# Patient Record
Sex: Female | Born: 1970 | Race: White | Hispanic: No | Marital: Married | Smoking: Never smoker
Health system: Southern US, Community
[De-identification: ages and names within clinical notes are randomized; demographics above are authoritative.]

## PROBLEM LIST (undated history)

## (undated) DIAGNOSIS — M1990 Unspecified osteoarthritis, unspecified site: Secondary | ICD-10-CM

## (undated) DIAGNOSIS — G459 Transient cerebral ischemic attack, unspecified: Secondary | ICD-10-CM

## (undated) DIAGNOSIS — E079 Disorder of thyroid, unspecified: Secondary | ICD-10-CM

## (undated) DIAGNOSIS — I10 Essential (primary) hypertension: Secondary | ICD-10-CM

## (undated) DIAGNOSIS — E282 Polycystic ovarian syndrome: Secondary | ICD-10-CM

## (undated) HISTORY — DX: Disorder of thyroid, unspecified: E07.9

## (undated) HISTORY — DX: Polycystic ovarian syndrome: E28.2

## (undated) HISTORY — DX: Unspecified osteoarthritis, unspecified site: M19.90

---

## 1997-01-22 HISTORY — PX: OTHER SURGICAL HISTORY: SHX169

## 2010-09-30 ENCOUNTER — Inpatient Hospital Stay (INDEPENDENT_AMBULATORY_CARE_PROVIDER_SITE_OTHER)
Admission: RE | Admit: 2010-09-30 | Discharge: 2010-09-30 | Disposition: A | Discharge status: Home / Self Care | Payer: PRIVATE HEALTH INSURANCE | Source: Ambulatory Visit | Attending: Emergency Medicine | Admitting: Emergency Medicine

## 2010-09-30 ENCOUNTER — Encounter: Payer: Self-pay | Admitting: Emergency Medicine

## 2010-09-30 DIAGNOSIS — E039 Hypothyroidism, unspecified: Secondary | ICD-10-CM | POA: Insufficient documentation

## 2010-09-30 DIAGNOSIS — J069 Acute upper respiratory infection, unspecified: Secondary | ICD-10-CM

## 2010-10-17 ENCOUNTER — Inpatient Hospital Stay (INDEPENDENT_AMBULATORY_CARE_PROVIDER_SITE_OTHER)
Admission: RE | Admit: 2010-10-17 | Discharge: 2010-10-17 | Disposition: A | Discharge status: Home / Self Care | Payer: PRIVATE HEALTH INSURANCE | Source: Ambulatory Visit | Attending: Family Medicine | Admitting: Family Medicine

## 2010-10-17 ENCOUNTER — Encounter: Payer: Self-pay | Admitting: Family Medicine

## 2010-10-17 DIAGNOSIS — D509 Iron deficiency anemia, unspecified: Secondary | ICD-10-CM | POA: Insufficient documentation

## 2010-10-17 DIAGNOSIS — R5383 Other fatigue: Secondary | ICD-10-CM

## 2010-10-17 DIAGNOSIS — I10 Essential (primary) hypertension: Secondary | ICD-10-CM

## 2010-10-17 DIAGNOSIS — R609 Edema, unspecified: Secondary | ICD-10-CM

## 2010-10-17 DIAGNOSIS — R5381 Other malaise: Secondary | ICD-10-CM | POA: Insufficient documentation

## 2010-10-17 DIAGNOSIS — G4700 Insomnia, unspecified: Secondary | ICD-10-CM | POA: Insufficient documentation

## 2010-10-17 DIAGNOSIS — E039 Hypothyroidism, unspecified: Secondary | ICD-10-CM

## 2010-10-17 LAB — CONVERTED CEMR LAB
Bilirubin Urine: NEGATIVE
Glucose, Urine, Semiquant: NEGATIVE
Specific Gravity, Urine: 1.02
WBC Urine, dipstick: NEGATIVE
pH: 6.5

## 2010-10-20 ENCOUNTER — Encounter: Payer: Self-pay | Admitting: Family Medicine

## 2010-10-29 ENCOUNTER — Encounter: Payer: Self-pay | Admitting: Family Medicine

## 2010-11-01 ENCOUNTER — Ambulatory Visit (INDEPENDENT_AMBULATORY_CARE_PROVIDER_SITE_OTHER): Payer: PRIVATE HEALTH INSURANCE | Admitting: Family Medicine

## 2010-11-01 ENCOUNTER — Encounter: Payer: Self-pay | Admitting: Family Medicine

## 2010-11-01 VITALS — BP 128/87 | HR 71 | Ht 61.0 in | Wt 142.0 lb

## 2010-11-01 DIAGNOSIS — R61 Generalized hyperhidrosis: Secondary | ICD-10-CM

## 2010-11-01 DIAGNOSIS — I10 Essential (primary) hypertension: Secondary | ICD-10-CM | POA: Insufficient documentation

## 2010-11-01 DIAGNOSIS — M130 Polyarthritis, unspecified: Secondary | ICD-10-CM | POA: Insufficient documentation

## 2010-11-01 DIAGNOSIS — C439 Malignant melanoma of skin, unspecified: Secondary | ICD-10-CM | POA: Insufficient documentation

## 2010-11-01 DIAGNOSIS — M1990 Unspecified osteoarthritis, unspecified site: Secondary | ICD-10-CM

## 2010-11-01 DIAGNOSIS — G8929 Other chronic pain: Secondary | ICD-10-CM | POA: Insufficient documentation

## 2010-11-01 DIAGNOSIS — E282 Polycystic ovarian syndrome: Secondary | ICD-10-CM | POA: Insufficient documentation

## 2010-11-01 DIAGNOSIS — M549 Dorsalgia, unspecified: Secondary | ICD-10-CM

## 2010-11-01 DIAGNOSIS — E039 Hypothyroidism, unspecified: Secondary | ICD-10-CM | POA: Insufficient documentation

## 2010-11-01 MED ORDER — ZOLPIDEM TARTRATE 10 MG PO TABS: 10.0000 mg | ORAL_TABLET | Freq: Every evening | ORAL | Status: DC | PRN

## 2010-11-01 NOTE — Patient Instructions (Signed)
We will you with your lab results.

## 2010-11-01 NOTE — Progress Notes (Signed)
Subjective:    Patient ID: Carly [name], female    DOB: November 23, 1970, 40 y.o.   MRN: [id number]  HPI Ran out of her thyroid med for 6 weeks and started gaining weight and face started swelling so went to UC.  Has been back on the 125 dose for about a week.  She is feeling better.  They increased her dose to her and we have micrograms about a week ago.  She also complains of hot flashes and night sweats. She says her previous physician felt that she was likely perimenopausal. I'm not sure to ask to check hormone levels. 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 she has not been on birth control because she's had infertility issues. She has been on birth control the past to help regulate her periods. She was on Yasmin one time and they started noticing elevations in her blood pressure so was discontinued. She now is treated with losartan for hypertension and has not restarted any birth control.  Review of Systems  Constitutional: Negative for fever, diaphoresis and unexpected weight change.  HENT: Negative for hearing loss, rhinorrhea and tinnitus.        Hay fever and allergies  Eyes: Negative for visual disturbance.  Respiratory: Negative for cough and wheezing.   Cardiovascular: Negative for chest pain and palpitations.  Gastrointestinal: Negative for nausea, vomiting, diarrhea and blood in stool.  Genitourinary: Negative for vaginal bleeding, vaginal discharge and difficulty urinating.  Musculoskeletal: Negative for myalgias and arthralgias.  Skin: Negative for rash.  Neurological: Negative for headaches.  Hematological: Negative for adenopathy. Does not bruise/bleed easily.  Psychiatric/Behavioral: Negative for sleep disturbance and dysphoric mood. The patient is not nervous/anxious.     BP 128/87  Pulse 71  Ht 5\' 1"  (1.549 m)  Wt 142 lb (64.411 kg)  BMI 26.83 kg/m2    No Known Allergies  Past Medical History  Diagnosis Date  . Thyroid disease   . PCOS (polycystic ovarian syndrome)     . Arthritis     Past Surgical History  Procedure Date  . Melanoma removal     RT SIDE  . Lymph nodes   . 2 ectopic pregnancies [id number]  . Removal of fallopian tube 1999    History   Social History  . Marital Status: Married    Spouse Name: Molly Maduro    Number of Children: 3  . Years of Education: N/A   Occupational History  . 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    Social History Main Topics  . Smoking status: Never Smoker   . Smokeless tobacco: Not on file  . Alcohol Use: No  . Drug Use: No  . Sexually Active: Yes -- Female partner(s)     adopted   Other Topics Concern  . Not on file   Social History Narrative   Caffeine daily. No regular exercise.  She was adopted.     Family History  Problem Relation Age of Onset  . Melanoma Mother   . Depression Mother   . Hyperlipidemia Mother   . Colon cancer Maternal Grandmother   . Colon cancer Father 33    deceased.     Ms. Paullin does not currently have medications on file.     Objective:   Physical Exam  Constitutional: She is oriented to person, place, and time. She appears well-developed and well-nourished.  HENT:  Head: Normocephalic and atraumatic.  Cardiovascular: Normal rate, regular rhythm and normal heart sounds.  Pulmonary/Chest: Effort normal and breath sounds normal.  Neurological: She is alert and oriented to person, place, and time.  Skin: Skin is warm and dry.  Psychiatric: She has a normal mood and affect. Her behavior is normal.          Assessment & Plan:  Hypothyroid-will give her Lasix and her blood work checked in the next couple weeks when she has been on there current dose at least 2-3 weeks. That way we can adjust it before she is due for her next refill. She was previously on 125 mcg so this will most likely be an accurate dose.  Night sweats/hot flashes-she certainly could be perimenopausal. I do think that is possible. Other medications are new. No drugs like meloxicam can  also cause hot flashes. She says she's recently decreased her dose. The Cymbalta can also cause nighttime sweats and hot flashes but she is also recently decreased her dose from 90-60 mg on that as well. We will also make sure that her thyroid is adjusted. She could also consider going back on birth control as an option.

## 2010-12-04 ENCOUNTER — Other Ambulatory Visit: Payer: Self-pay | Admitting: *Deleted

## 2010-12-04 MED ORDER — SPIRONOLACTONE 100 MG PO TABS: 100.0000 mg | ORAL_TABLET | Freq: Two times a day (BID) | ORAL | Status: DC

## 2010-12-04 MED ORDER — ZOLPIDEM TARTRATE 10 MG PO TABS: 10.0000 mg | ORAL_TABLET | Freq: Every evening | ORAL | Status: DC | PRN

## 2010-12-05 ENCOUNTER — Other Ambulatory Visit: Payer: Self-pay | Admitting: Family Medicine

## 2010-12-05 LAB — COMPLETE METABOLIC PANEL WITH GFR
ALT: 11 U/L (ref 0–35)
Albumin: 4.2 g/dL (ref 3.5–5.2)
Alkaline Phosphatase: 35 U/L — ABNORMAL LOW (ref 39–117)
CO2: 26 mEq/L (ref 19–32)
GFR, Est Non African American: 80 mL/min/{1.73_m2}
Glucose, Bld: 75 mg/dL (ref 70–99)
Potassium: 4.2 mEq/L (ref 3.5–5.3)
Sodium: 141 mEq/L (ref 135–145)
Total Bilirubin: 0.5 mg/dL (ref 0.3–1.2)
Total Protein: 6.6 g/dL (ref 6.0–8.3)

## 2010-12-05 LAB — LIPID PANEL
HDL: 42 mg/dL (ref >39)
LDL Cholesterol: 87 mg/dL (ref 0–99)
Total CHOL/HDL Ratio: 3.5 Ratio

## 2010-12-05 MED ORDER — LEVOTHYROXINE SODIUM 150 MCG PO TABS: 150.0000 ug | ORAL_TABLET | Freq: Every day | ORAL | Status: DC

## 2010-12-25 NOTE — Progress Notes (Signed)
Summary: Followup Call  Attempted to contact patient; left message on voicemail. Donna Christen MD  October 20, 2010 8:55 AM   Patient presented to clinic to discuss lab results.  She states that her edema has resolved and she feels better taking Synthroid daily.  Advised her to begin taking the Synthroid daily.  Follow-up with PCP. Donna Christen MD  October 20, 2010 2:43 PM

## 2010-12-25 NOTE — Letter (Signed)
Summary: Out of Work  [facility]  [address]   [place], [place] [address]   Phone: [phone]  Fax: [phone]    October 17, 2010   Employee:  BRYTANI VOTH    To Whom It May Concern:   For Medical reasons, please excuse the above named employee from work today.   If you need additional information, please feel free to contact our office.         Sincerely,    Donna Christen MD

## 2010-12-25 NOTE — Progress Notes (Signed)
Summary: SWELLING ALL OVER THE BODY rm 4   Vital Signs:  Patient Profile:   40 Years Old Female CC:      swelling all over x  Height:     60.5 inches Weight:      147.50 pounds O2 Sat:      100 % O2 treatment:    Room Air Temp:     98.5 degrees F oral Pulse rate:   69 / minute Resp:     16 per minute BP sitting:   135 / 89  (left arm) Cuff size:   regular  Vitals Entered By: Clemens Catholic LPN (October 17, 2010 9:23 AM)                  Updated Prior Medication List: SYNTHROID 125 MCG TABS (LEVOTHYROXINE SODIUM)  METFORMIN HCL 1000 MG TABS (METFORMIN HCL) 2 QD LOSARTAN POTASSIUM 25 MG TABS (LOSARTAN POTASSIUM)  MOBIC 7.5 MG TABS (MELOXICAM)  PLAQUENIL 200 MG TABS (HYDROXYCHLOROQUINE SULFATE)   Current Allergies (reviewed today): ! SULFAHistory of Present Illness Chief Complaint: swelling all over x  History of Present Illness:  Subjective:  Patient complains of gradual weight gain over the past month (about 9 pounds) and over the past week has developed generalized mild swelling, most noticeable in her face.  She has been mildly fatigued during this time.  She also notes that her blood pressure has increased, despite her Losartan. She has a history of hypothyroid (diagnosed with Hashimoto's disease age 28) and has not taken her Synthroid 125 micrograms for about 6 weeks.  She states that she has not been able to get an appt at her PCP's office because her PCP is on maternity leave. She has a history of PCOS and hyperglycemia for which she takes Metformin. She has osteoarthritis and reports that she is having improved pain relief with chiropractic treatment and massage therapy.  As a result she has been able to decrease her Cymbalta to 60mg  ever other day and takes less Mobic. She has a history of iron deficiency anemia She has chronic insomnia and has not been able to get her Lunesta refilled.  REVIEW OF SYSTEMS Constitutional Symptoms       Complains of  weight gain.     Denies fever, chills, night sweats, weight loss, and fatigue.  Eyes       Denies change in vision, eye pain, eye discharge, glasses, contact lenses, and eye surgery. Ear/Nose/Throat/Mouth       Denies hearing loss/aids, change in hearing, ear pain, ear discharge, dizziness, frequent runny nose, frequent nose bleeds, sinus problems, sore throat, hoarseness, and tooth pain or bleeding.  Respiratory       Denies dry cough, productive cough, wheezing, shortness of breath, asthma, bronchitis, and emphysema/COPD.  Cardiovascular       Complains of tires easily with exhertion.      Denies murmurs and chest pain.    Gastrointestinal       Denies stomach pain, nausea/vomiting, diarrhea, constipation, blood in bowel movements, and indigestion. Genitourniary       Denies painful urination, kidney stones, and loss of urinary control. Neurological       Denies paralysis, seizures, and fainting/blackouts. Musculoskeletal       Complains of swelling.      Denies muscle pain, joint pain, joint stiffness, decreased range of motion, redness, muscle weakness, and gout.  Skin       Denies bruising, unusual mles/lumps or sores, and hair/skin or  nail changes.  Psych       Denies mood changes, temper/anger issues, anxiety/stress, speech problems, depression, and sleep problems. Other Comments: pt c/o swelling all over and weight gain x 1 mnth. She has noticed swelling more since Sunday. She states that she  has been out of thyroid med x 6 wks bc her PCP is out on maternity leave and she has been unable to get refills from the office after multiple phone calls.   Past History:  Past Medical History: Hypothyroidism PCOS osteoarthritis  Past Surgical History: Reviewed history from 09/30/2010 and no changes required. MELANOMA REMOVAL OF RIGHT SIDE LYMPH NODES  Family History: Reviewed history from 09/30/2010 and no changes required. ADOPTED  Social History: Reviewed history from  09/30/2010 and no changes required. SMOKE- NO ALCOHOL-NO REC.DRUGS-NO   Objective:  Appearance:  Patient appears stated age and in no acute distress  Eyes:  Pupils are equal, round, and reactive to light and accomodation.  Extraocular movement is intact.  Conjunctivae are not inflamed.  Ears:  Canals normal.  Tympanic membranes normal.   Nose:  No congestion.  No sinus tenderness  Pharynx:  Normal  Neck:  Supple.  No adenopathy is present.  No thyromegaly is present  Lungs:  Clear to auscultation.  Breath sounds are equal.  Heart:  Regular rate and rhythm without murmurs, rubs, or gallops.  Abdomen:  Nontender without masses or hepatosplenomegaly.  Bowel sounds are present.  No CVA or flank tenderness.  Extremities:   Mild non-pitting edema lower legs.   Skin:  No rash urinalysis (dipstick):  negative Assessment New Problems: INSOMNIA, CHRONIC (ICD-307.42) Hx of ANEMIA, IRON DEFICIENCY (ICD-280.9) FATIGUE (ICD-780.79) EDEMA (ICD-782.3) ESSENTIAL HYPERTENSION, BENIGN (ICD-401.1)  SUSPECT GENERALIZED EDEMA A RESULT OF UNTREATED HYPOTHYROID  Plan New Medications/Changes: SYNTHROID 125 MCG TABS (LEVOTHYROXINE SODIUM) One by mouth daily  #30 x 0, 10/17/2010, Donna Christen MD LUNESTA 3 MG TABS (ESZOPICLONE) One by mouth hs for insomnia  #15 x 0, 10/17/2010, Donna Christen MD SYNTHROID 75 MCG TABS (LEVOTHYROXINE SODIUM) One by mouth daily  #7 x 0, 10/17/2010, Donna Christen MD HYDROCHLOROTHIAZIDE 25 MG TABS (HYDROCHLOROTHIAZIDE) One-half by mouth qam  #7 x 0, 10/17/2010, Donna Christen MD  New Orders: T-CMP with estimated GFR [[id number]] T-CBC w/Diff [[id number]] T-TSH [69629-52841] T-Thyroid Profile [phone] Urinalysis [CPT-81003] New Patient Level V [99205] Planning Comments:   Check TSH, thyroid profile, CMP, CBC Resume Synthroid at lower dose of 75 micrograms daily for one week while TSH pending, then increase to 125 micrograms if appropriate. Begin HCTZ 25mg ,  one-half QAM for 5 to 7 days while Synthroid being titrated to decrease her fluid retention Rx for Lunesta Follow-up with PCP as soon as possible   The patient and/or caregiver has been counseled thoroughly with regard to medications prescribed including dosage, schedule, interactions, rationale for use, and possible side effects and they verbalize understanding.  Diagnoses and expected course of recovery discussed and will return if not improved as expected or if the condition worsens. Patient and/or caregiver verbalized understanding.  Prescriptions: SYNTHROID 125 MCG TABS (LEVOTHYROXINE SODIUM) One by mouth daily  #30 x 0   Entered and Authorized by:   Donna Christen MD   Signed by:   Donna Christen MD on 10/17/2010   Method used:   Print then Give to Patient   RxID:   [id number] LUNESTA 3 MG TABS (ESZOPICLONE) One by mouth hs for insomnia  #15 x 0   Entered and Authorized by:   Donna Christen MD  Signed by:   Donna Christen MD on 10/17/2010   Method used:   Print then Give to Patient   RxID:   [id number] SYNTHROID 75 MCG TABS (LEVOTHYROXINE SODIUM) One by mouth daily  #7 x 0   Entered and Authorized by:   Donna Christen MD   Signed by:   Donna Christen MD on 10/17/2010   Method used:   Print then Give to Patient   RxID:   [id number] HYDROCHLOROTHIAZIDE 25 MG TABS (HYDROCHLOROTHIAZIDE) One-half by mouth qam  #7 x 0   Entered and Authorized by:   Donna Christen MD   Signed by:   Donna Christen MD on 10/17/2010   Method used:   Print then Give to Patient   RxID:   [id number]   Orders Added: 1)  T-CMP with estimated GFR [[address]] 2)  T-CBC w/Diff [[id number]] 3)  T-TSH [95638-75643] 4)  T-Thyroid Profile [phone] 5)  Urinalysis [CPT-81003] 6)  New Patient Level V [99205]    Laboratory Results   Urine Tests  Date/Time Received: October 17, 2010 10:34 AM  Date/Time Reported: October 17, 2010 10:34 AM   Routine Urinalysis   Color:  yellow Appearance: Clear Glucose: negative   (Normal Range: Negative) Bilirubin: negative   (Normal Range: Negative) Ketone: negative   (Normal Range: Negative) Spec. Gravity: 1.020   (Normal Range: 1.003-1.035) Blood: negative   (Normal Range: Negative) pH: 6.5   (Normal Range: 5.0-8.0) Protein: negative   (Normal Range: Negative) Urobilinogen: 0.2   (Normal Range: 0-1) Nitrite: negative   (Normal Range: Negative) Leukocyte Esterace: negative   (Normal Range: Negative)

## 2010-12-25 NOTE — Progress Notes (Signed)
Summary: sinus infec/tm   Vital Signs:  Patient Profile:   39 Years Old Female CC:      SINUS/EAR INFECTION Height:     60.5 inches Weight:      144 pounds O2 Sat:      99 % O2 treatment:    Room Air Temp:     98.7 degrees F oral Pulse rate:   70 / minute Resp:     20 per minute BP sitting:   135 / 89  (left arm) Cuff size:   regular  Vitals Entered By: Linton Flemings RN (September 30, 2010 4:38 PM)                  Updated Prior Medication List: SYNTHROID  Current Allergies: ! SULFAHistory of Present Illness Chief Complaint: SINUS/EAR INFECTION History of Present Illness: 40 Years Old Female complains of onset of cold symptoms for 4 days.  Carly [name] has been using OTC cold meds which is helping a little bit.  She also has Flonase that she uses.  She works at [employer].   No sore throat No cough No pleuritic pain No wheezing + nasal congestion No post-nasal drainage + sinus pain/pressure No chest congestion No itchy/red eyes + earache No hemoptysis No SOB No chills/sweats No fever No nausea No vomiting No abdominal pain No diarrhea No skin rashes No fatigue No myalgias No headache   REVIEW OF SYSTEMS Constitutional Symptoms      Denies fever, chills, night sweats, weight loss, weight gain, and fatigue.  Eyes       Denies change in vision, eye pain, eye discharge, glasses, contact lenses, and eye surgery. Ear/Nose/Throat/Mouth       Complains of ear pain and sinus problems.      Denies hearing loss/aids, change in hearing, ear discharge, dizziness, frequent runny nose, frequent nose bleeds, sore throat, hoarseness, and tooth pain or bleeding.  Respiratory       Denies dry cough, productive cough, wheezing, shortness of breath, asthma, bronchitis, and emphysema/COPD.  Cardiovascular       Denies murmurs, chest pain, and tires easily with exhertion.    Gastrointestinal       Denies stomach pain, nausea/vomiting, diarrhea, constipation, blood in bowel  movements, and indigestion. Genitourniary       Denies painful urination, kidney stones, and loss of urinary control. Neurological       Denies paralysis, seizures, and fainting/blackouts. Musculoskeletal       Denies muscle pain, joint pain, joint stiffness, decreased range of motion, redness, swelling, muscle weakness, and gout.  Skin       Denies bruising, unusual mles/lumps or sores, and hair/skin or nail changes.  Psych       Denies mood changes, temper/anger issues, anxiety/stress, speech problems, depression, and sleep problems. Other Comments: STARTED THIS WEEK AND LEFT EAR PAIN IS WORSE SINCE YESTERDAY   Past History:  Past Medical History: Hypothyroidism  Past Surgical History: MELANOMA REMOVAL OF RIGHT SIDE LYMPH NODES  Family History: ADOPTED  Social History: SMOKE- NO ALCOHOL-NO REC.DRUGS-NO Physical Exam General appearance: well developed, well nourished, no acute distress Head: Sinus tenderness, L ant cerv tenderness Ears: cerumen bilateral Nasal: mucosa pink, nonedematous, no septal deviation, turbinates normal Oral/Pharynx: tongue normal, posterior pharynx without erythema or exudate Chest/Lungs: no rales, wheezes, or rhonchi bilateral, breath sounds equal without effort Heart: regular rate and  rhythm, no murmur MSE: oriented to time, place, and person Assessment New Problems: HYPOTHYROIDISM (ICD-244.9) UPPER RESPIRATORY INFECTION, ACUTE (  ICD-465.9)   Plan New Medications/Changes: AMOXICILLIN 875 MG TABS (AMOXICILLIN) 1 by mouth two times a day for 8 days  #16 x 0, 09/30/2010, Hoyt Koch MD  New Orders: New Patient Level III [phone] Planning Comments:   1)  Take the prescribed antibiotic as instructed. 2)  Use nasal saline solution (over the counter) at least 3 times a day. 3)  Use over the counter decongestants like Zyrtec-D every 12 hours as needed to help with congestion. 4)  Can take tylenol every 6 hours or motrin every 8 hours for  pain or fever. 5)  Follow up with your primary doctor  if no improvement in 5-7 days, sooner if increasing pain, fever, or new symptoms.    The patient and/or caregiver has been counseled thoroughly with regard to medications prescribed including dosage, schedule, interactions, rationale for use, and possible side effects and they verbalize understanding.  Diagnoses and expected course of recovery discussed and will return if not improved as expected or if the condition worsens. Patient and/or caregiver verbalized understanding.  Prescriptions: AMOXICILLIN 875 MG TABS (AMOXICILLIN) 1 by mouth two times a day for 8 days  #16 x 0   Entered and Authorized by:   Hoyt Koch MD   Signed by:   Hoyt Koch MD on 09/30/2010   Method used:   Print then Give to Patient   RxID:   [id number]   Orders Added: 1)  New Patient Level III [95621]

## 2011-01-23 ENCOUNTER — Other Ambulatory Visit: Payer: Self-pay | Admitting: Family Medicine

## 2011-02-02 ENCOUNTER — Telehealth: Payer: Self-pay | Admitting: *Deleted

## 2011-02-02 DIAGNOSIS — E0590 Thyrotoxicosis, unspecified without thyrotoxic crisis or storm: Secondary | ICD-10-CM

## 2011-02-03 ENCOUNTER — Other Ambulatory Visit: Payer: Self-pay | Admitting: Family Medicine

## 2011-02-03 LAB — TSH: TSH: 0.095 u[IU]/mL — ABNORMAL LOW (ref 0.350–4.500)

## 2011-02-03 MED ORDER — LEVOTHYROXINE SODIUM 137 MCG PO TABS: 137.0000 ug | ORAL_TABLET | Freq: Every day | ORAL | Status: DC

## 2011-02-19 ENCOUNTER — Other Ambulatory Visit: Payer: Self-pay | Admitting: *Deleted

## 2011-02-19 MED ORDER — LOSARTAN POTASSIUM 25 MG PO TABS: 25.0000 mg | ORAL_TABLET | Freq: Every day | ORAL | Status: DC

## 2011-04-16 ENCOUNTER — Other Ambulatory Visit: Payer: Self-pay | Admitting: *Deleted

## 2011-04-16 MED ORDER — ZOLPIDEM TARTRATE 10 MG PO TABS: 10.0000 mg | ORAL_TABLET | Freq: Every evening | ORAL | Status: DC | PRN

## 2011-05-29 ENCOUNTER — Telehealth: Payer: Self-pay | Admitting: *Deleted

## 2011-05-29 DIAGNOSIS — E039 Hypothyroidism, unspecified: Secondary | ICD-10-CM

## 2011-05-29 MED ORDER — LEVOTHYROXINE SODIUM 137 MCG PO TABS: 137.0000 ug | ORAL_TABLET | Freq: Every day | ORAL | Status: DC

## 2011-05-29 MED ORDER — ZOLPIDEM TARTRATE 10 MG PO TABS: 10.0000 mg | ORAL_TABLET | Freq: Every evening | ORAL | Status: DC | PRN

## 2011-05-31 LAB — TSH: TSH: 0.69 u[IU]/mL (ref 0.350–4.500)

## 2011-06-04 ENCOUNTER — Ambulatory Visit (INDEPENDENT_AMBULATORY_CARE_PROVIDER_SITE_OTHER): Payer: PRIVATE HEALTH INSURANCE | Admitting: Family Medicine

## 2011-06-04 ENCOUNTER — Telehealth: Payer: Self-pay | Admitting: Family Medicine

## 2011-06-04 ENCOUNTER — Encounter: Payer: Self-pay | Admitting: Family Medicine

## 2011-06-04 VITALS — BP 134/94 | HR 79 | Temp 98.2°F | Wt 132.0 lb

## 2011-06-04 DIAGNOSIS — G4700 Insomnia, unspecified: Secondary | ICD-10-CM

## 2011-06-04 DIAGNOSIS — J069 Acute upper respiratory infection, unspecified: Secondary | ICD-10-CM

## 2011-06-04 DIAGNOSIS — I10 Essential (primary) hypertension: Secondary | ICD-10-CM

## 2011-06-04 DIAGNOSIS — Z1231 Encounter for screening mammogram for malignant neoplasm of breast: Secondary | ICD-10-CM

## 2011-06-04 DIAGNOSIS — E039 Hypothyroidism, unspecified: Secondary | ICD-10-CM

## 2011-06-04 MED ORDER — ZOLPIDEM TARTRATE 10 MG PO TABS: 10.0000 mg | ORAL_TABLET | Freq: Every evening | ORAL | Status: DC | PRN

## 2011-06-04 MED ORDER — AMOXICILLIN-POT CLAVULANATE 875-125 MG PO TABS: 1.0000 | ORAL_TABLET | Freq: Two times a day (BID) | ORAL | Status: AC

## 2011-06-04 NOTE — Telephone Encounter (Signed)
Left message on pt.'s vm.

## 2011-06-04 NOTE — Telephone Encounter (Signed)
Due for BMP.  I saw her today and forgot to order a BMP.  She can go anytime. Doesn't have to fast.

## 2011-06-04 NOTE — Progress Notes (Signed)
  Subjective:    Patient ID: Carly [name], female    DOB: [birth year], 41 y.o.   MRN: [id number]  HPI URI- Sxs 5 days with runny nose, cough, productive green, mild ST,  Mild fever yesterday.  Tried to pop her ears and then felt dizzy.  Tried mucinex, sudafed and upped her dose of flonase. Also tried robitussin.  Now has laryngitis.  Hoarseness started about 3 days ago.  Cough started yesterday.  SOB with walking up steps.  Chest hurts to take a deep breath.  Thought initially allergies.    Hypothyroid - Weight had been down but has gained 5 lbs recently but admits stress and poor diet.  Has had hair loss for years.  Has felt it has worsened recently.  High stress.    HTN - Takes losartan at night.  And occ takes another one in the AM if she "feels" it is up. Says doesn't check her BP.  Not taking her sprinolactone regularly. Uses it for her PCOS.  Will get HA when BP is high.  Review of Systems     Objective:   Physical Exam  Constitutional: She is oriented to person, place, and time. She appears well-developed and well-nourished.  HENT:  Head: Normocephalic and atraumatic.  Right Ear: External ear normal.  Left Ear: External ear normal.  Nose: Nose normal.  Mouth/Throat: Oropharynx is clear and moist.       TMs and canals are clear.   Eyes: Conjunctivae and EOM are normal. Pupils are equal, round, and reactive to light.  Neck: Neck supple. No thyromegaly present.       Tender cervical LN.   Cardiovascular: Normal rate, regular rhythm and normal heart sounds.   Pulmonary/Chest: Effort normal and breath sounds normal. She has no wheezes.  Lymphadenopathy:    She has no cervical adenopathy.  Neurological: She is alert and oriented to person, place, and time.  Skin: Skin is warm and dry.  Psychiatric: She has a normal mood and affect.          Assessment & Plan:  URI - sinusitis vs viral. Will go ahead and give antibiotic. Call if not better in one weeks. Can continue  symptomatic care.   Hypothryoid- doing well. Repeat TSH was normal.  Doing well.    HTN- Uncontrolled.  Start the spironoilatone consistantly and recheck BP in 4-6 weeks. Work [employer]. We discussed different options including increasing her losartan or changing it to 25 mg twice a day or making sure she takes her spironolactone the morning consistently. She opted to try taking her spironolactone consistently. She currently uses it more for her PCO S. And facial hair symptoms. Then followup in 4-6 weeks to recheck her blood pressure to make sure well controlled. If not then we'll definitely increase her losartan.  Insomnia - we discussed some recent studies that show longer drug effects in women. We discussed trying half a tab, which is 5 mg to see if this still works well for her but minimizes her exposure to the medication. She's well aware of the dependency issues. We will her medication today.

## 2011-06-04 NOTE — Patient Instructions (Signed)

## 2011-06-27 ENCOUNTER — Ambulatory Visit: Payer: PRIVATE HEALTH INSURANCE

## 2011-06-28 LAB — BASIC METABOLIC PANEL
BUN: 11 mg/dL (ref 6–23)
Calcium: 9.2 mg/dL (ref 8.4–10.5)
Chloride: 105 mEq/L (ref 96–112)
Creat: 0.78 mg/dL (ref 0.50–1.10)

## 2011-06-28 NOTE — Telephone Encounter (Signed)
Quick Note:  All labs are normal. ______ 

## 2011-06-29 ENCOUNTER — Encounter: Payer: Self-pay | Admitting: *Deleted

## 2011-07-12 ENCOUNTER — Other Ambulatory Visit: Payer: Self-pay | Admitting: *Deleted

## 2011-07-12 MED ORDER — LEVOTHYROXINE SODIUM 137 MCG PO TABS: 137.0000 ug | ORAL_TABLET | Freq: Every day | ORAL | Status: DC

## 2011-07-23 ENCOUNTER — Other Ambulatory Visit: Payer: Self-pay | Admitting: *Deleted

## 2011-07-23 MED ORDER — LOSARTAN POTASSIUM 25 MG PO TABS: 25.0000 mg | ORAL_TABLET | Freq: Every day | ORAL | Status: DC

## 2011-07-30 ENCOUNTER — Encounter: Payer: Self-pay | Admitting: *Deleted

## 2011-08-24 ENCOUNTER — Other Ambulatory Visit: Payer: Self-pay | Admitting: Family Medicine

## 2011-09-11 ENCOUNTER — Other Ambulatory Visit: Payer: Self-pay | Admitting: Family Medicine

## 2011-11-23 ENCOUNTER — Ambulatory Visit (INDEPENDENT_AMBULATORY_CARE_PROVIDER_SITE_OTHER): Payer: Managed Care, Other (non HMO) | Admitting: Family Medicine

## 2011-11-23 ENCOUNTER — Encounter: Payer: Self-pay | Admitting: Family Medicine

## 2011-11-23 VITALS — BP 134/84 | HR 63 | Ht 60.5 in | Wt 133.0 lb

## 2011-11-23 DIAGNOSIS — F430 Acute stress reaction: Secondary | ICD-10-CM

## 2011-11-23 DIAGNOSIS — D509 Iron deficiency anemia, unspecified: Secondary | ICD-10-CM

## 2011-11-23 DIAGNOSIS — R252 Cramp and spasm: Secondary | ICD-10-CM

## 2011-11-23 DIAGNOSIS — G4700 Insomnia, unspecified: Secondary | ICD-10-CM

## 2011-11-23 DIAGNOSIS — R0789 Other chest pain: Secondary | ICD-10-CM

## 2011-11-23 DIAGNOSIS — F438 Other reactions to severe stress: Secondary | ICD-10-CM

## 2011-11-23 LAB — CBC WITH DIFFERENTIAL/PLATELET
Basophils Relative: 1 % (ref 0–1)
Eosinophils Absolute: 0.1 10*3/uL (ref 0.0–0.7)
HCT: 36.8 % (ref 36.0–46.0)
Hemoglobin: 12.2 g/dL (ref 12.0–15.0)
MCH: 28.9 pg (ref 26.0–34.0)
MCHC: 33.2 g/dL (ref 30.0–36.0)
Monocytes Absolute: 0.4 10*3/uL (ref 0.1–1.0)
Monocytes Relative: 6 % (ref 3–12)

## 2011-11-23 MED ORDER — LOSARTAN POTASSIUM 25 MG PO TABS: 25.0000 mg | ORAL_TABLET | Freq: Every day | ORAL | Status: DC

## 2011-11-23 MED ORDER — ZOLPIDEM TARTRATE 10 MG PO TABS: 10.0000 mg | ORAL_TABLET | Freq: Every evening | ORAL | Status: DC | PRN

## 2011-11-23 MED ORDER — BUPROPION HCL ER (XL) 150 MG PO TB24: 150.0000 mg | ORAL_TABLET | Freq: Every day | ORAL | Status: DC

## 2011-11-23 NOTE — Progress Notes (Signed)
Subjective:    Patient ID: Carly [name], female    DOB: 12-18-1970, 41 y.o.   MRN: [id number]  HPI Has been really stressed lately . To the point that she has been having chest pain and heart pounding when she gets really stressed or anxious. BP was 140/100 after a confrontation.  Having a lot of social stressors and custody battles with her husbands kids.  Has had a death in the family. Says half the abmien is not working for sleep lately.  So is taking benadryl with it.  But then the Benadryl makes her drowsy. She was on an antidepressant years ago when she initially went through divorce. She took it for about 6 months and says it was very helpful and did not have any side effects on it. She said she can't remember exactly what it was. She does need a refill on her Ambien.  Hypertension-no chest pain, shortness of breath. Taking her medications regularly. She does asked for refill on the losartan. She did have some blood work done through her [employer] recently.  Hypothyroid - Says has been having palpitations.  Has noticed some more cramps rexcently.  Feels really tired.  Hasn't been taking her iron pills lately but was able to give blood this summer. No significant weight changes, skin or hair changes.  Review of Systems BP 134/84  Pulse 63  Ht 5' 0.5" (1.537 m)  Wt 133 lb (60.328 kg)  BMI 25.55 kg/m2    Allergies  Allergen Reactions  . Sulfonamide Derivatives     Past Medical History  Diagnosis Date  . Thyroid disease   . PCOS (polycystic ovarian syndrome)   . Arthritis     Past Surgical History  Procedure Date  . Melanoma removal     RT SIDE  . Lymph nodes   . 2 ectopic pregnancies [id number]  . Removal of fallopian tube 1999    History   Social History  . Marital Status: Married    Spouse Name: Molly Maduro    Number of Children: 3  . Years of Education: N/A   Occupational History  . 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    Social History Main Topics  .  Smoking status: Never Smoker   . Smokeless tobacco: Not on file  . Alcohol Use: No  . Drug Use: No  . Sexually Active: Yes -- Female partner(s)     adopted   Other Topics Concern  . Not on file   Social History Narrative   Caffeine daily. No regular exercise.  She was adopted.     Family History  Problem Relation Age of Onset  . Melanoma Mother   . Depression Mother   . Hyperlipidemia Mother   . Colon cancer Maternal Grandmother   . Colon cancer Father 39    deceased.     Outpatient Encounter Prescriptions as of 11/23/2011  Medication Sig Dispense Refill  . levothyroxine (SYNTHROID, LEVOTHROID) 137 MCG tablet Take 1 tablet (137 mcg total) by mouth daily.  30 tablet  4  . losartan (COZAAR) 25 MG tablet Take 1 tablet (25 mg total) by mouth daily.  90 tablet  1  . metFORMIN (GLUCOPHAGE) 1000 MG tablet Take 1,000 mg by mouth 2 (two) times daily with a meal.        . spironolactone (ALDACTONE) 100 MG tablet Take 1 tablet (100 mg total) by mouth 2 (two) times daily.  180 tablet  0  . zolpidem (AMBIEN)  10 MG tablet Take 1 tablet (10 mg total) by mouth at bedtime as needed for sleep.  90 tablet  0  . DISCONTD: losartan (COZAAR) 25 MG tablet Take 1 tablet (25 mg total) by mouth daily.  30 tablet  3  . DISCONTD: zolpidem (AMBIEN) 10 MG tablet TAKE 1 TABLET BY MOUTH AT BEDTIME AS NEEDED **NEED APPOINT**  30 tablet  0  . buPROPion (WELLBUTRIN XL) 150 MG 24 hr tablet Take 1 tablet (150 mg total) by mouth daily.  30 tablet  1          Objective:   Physical Exam  Constitutional: She is oriented to person, place, and time. She appears well-developed and well-nourished.  HENT:  Head: Normocephalic and atraumatic.  Neck: Neck supple. No thyromegaly present.  Cardiovascular: Normal rate, regular rhythm and normal heart sounds.   Pulmonary/Chest: Effort normal and breath sounds normal.  Lymphadenopathy:    She has no cervical adenopathy.  Neurological: She is alert and oriented to person,  place, and time.  Skin: Skin is warm and dry.  Psychiatric: She has a normal mood and affect. Her behavior is normal.          Assessment & Plan:  Acute stress - GAD-7 score of 17 today.  dicussed options.  Medication vs counseling. She is open to both. We discussed starting Wellbutrin. We discussed potential risks and benefits and side effects of the medication. Also expanded does take several weeks to start working. Followup in one month. Call if having any problems with the medication.  Hypothyroid - check level to make sure well controlled.    Iron def anemia- recheck to make sure not causing her fatigue and cramps.   Insomnia - Can increase her ambien to 10mg  for now until welbutrin starts working. Avoid the Benadryl if it tends to cause excess sedation.  Atypical chest pain/palpitations-I. do think this is stress related. It tends to happen when she gets really anxious or stressed. Her EKG today shows a rate of 58 beats per minute, normal axis, normal sinus rhythm no acute ST-T wave changes. Technically she is bradycardic.  Hypertension-well controlled-she does need a refill on her losartan today.

## 2011-11-24 LAB — COMPLETE METABOLIC PANEL WITH GFR
BUN: 13 mg/dL (ref 6–23)
CO2: 24 mEq/L (ref 19–32)
Creat: 0.87 mg/dL (ref 0.50–1.10)
GFR, Est African American: >89 mL/min
GFR, Est Non African American: 82 mL/min
Glucose, Bld: 77 mg/dL (ref 70–99)
Total Bilirubin: 0.4 mg/dL (ref 0.3–1.2)

## 2011-11-26 ENCOUNTER — Other Ambulatory Visit: Payer: Self-pay

## 2011-11-26 ENCOUNTER — Other Ambulatory Visit: Payer: Self-pay | Admitting: Family Medicine

## 2011-11-26 MED ORDER — POLYSACCHARIDE IRON COMPLEX 150 MG PO CAPS: 150.0000 mg | ORAL_CAPSULE | Freq: Two times a day (BID) | ORAL | Status: DC

## 2011-11-26 MED ORDER — SPIRONOLACTONE 100 MG PO TABS: 100.0000 mg | ORAL_TABLET | Freq: Two times a day (BID) | ORAL | Status: DC

## 2011-11-26 MED ORDER — ZOLPIDEM TARTRATE 10 MG PO TABS: 10.0000 mg | ORAL_TABLET | Freq: Every evening | ORAL | Status: DC | PRN

## 2011-11-26 MED ORDER — LOSARTAN POTASSIUM 25 MG PO TABS: 25.0000 mg | ORAL_TABLET | Freq: Every day | ORAL | Status: DC

## 2011-11-26 MED ORDER — BUPROPION HCL ER (XL) 150 MG PO TB24: 150.0000 mg | ORAL_TABLET | Freq: Every day | ORAL | Status: DC

## 2011-11-26 NOTE — Telephone Encounter (Signed)
Refills sent to [facility] outpatient. The refills should have been sent to [facility]. Medication refills sent to [facility].

## 2011-11-28 ENCOUNTER — Encounter: Payer: Self-pay | Admitting: *Deleted

## 2011-12-12 ENCOUNTER — Ambulatory Visit (HOSPITAL_COMMUNITY): Payer: Managed Care, Other (non HMO) | Admitting: Psychology

## 2011-12-14 ENCOUNTER — Ambulatory Visit (INDEPENDENT_AMBULATORY_CARE_PROVIDER_SITE_OTHER): Payer: Managed Care, Other (non HMO) | Admitting: Psychology

## 2011-12-14 ENCOUNTER — Encounter (HOSPITAL_COMMUNITY): Payer: Self-pay | Admitting: Psychology

## 2011-12-14 DIAGNOSIS — F339 Major depressive disorder, recurrent, unspecified: Secondary | ICD-10-CM | POA: Insufficient documentation

## 2011-12-14 DIAGNOSIS — F411 Generalized anxiety disorder: Secondary | ICD-10-CM

## 2011-12-14 DIAGNOSIS — F988 Other specified behavioral and emotional disorders with onset usually occurring in childhood and adolescence: Secondary | ICD-10-CM

## 2011-12-14 DIAGNOSIS — F329 Major depressive disorder, single episode, unspecified: Secondary | ICD-10-CM

## 2011-12-14 NOTE — Progress Notes (Signed)
Presenting Problem Chief Complaint: The patient has been crying a lot more lately.  Sh has a lot going on.  Her second husband has been having a lot of medical issues going on lately.  She married her second husband in 2011 the day after her 5th birthday when she and her husband eloped.  She reports she is here because right now coping with all the stressors they have going on including financial stress, a lot of issues with both ex-wives (he was married twice),olest child moved in since August of last year and she is a 'lazy' teenager. The 42 year old child has led a very sheltered life and attends a [employer]th day [employer] life. They are now trying to determine next steps because the particular school she attends is til 10th grade; mother lives in [place] and wants her to return home and attend public school (no cost and wants her home). When the child moved in last year the patient's husband was still paying child support for the child while she was living in the father's home.  The 42 year old's mother doesn't believe she should have to support her daughter and has paid nothing.  As a result of this mother not contributing, the patient and her husband have contributing the couple have gone further in the whole.  The second wife doesn't belieeve she should have to pay either. She has bipolar illness, doesn't take medication regularly, has one catastrophy after another.  Her husband quit working for his brother in a shop in [place] and now works Financial trader system and the patient carries him on her insurance.  He only works part-time because his excuse is that he has to deal with taking care of the kids.  She works a full time and prn job while he works part time and she is angry.  The 2nd ex-wife is getting more out of control and she is becoming more confrontative toward the patient and it has been evidenced by other people outside the home.  She has had thoughts about leaving. She loves him and she loves  these kids but she would not like to walk out on the twins.  'I very much love this man, I don't like the situation we're in'. She want to make it a better situation and learn how to better cope with it.  What are the main stressors in your life right now, how long? Depression  2, Anxiety   2, Mood Swings  2, Appetite Change   2, Sleep Changes   2, Confusion   1, Memory Problems   1, Irritability   2, Excessive Worrying   1, Marital Stress   2 and Poor Concentration   2 She has had worsening symptoms for the past six months but has had them prior to her separation from first husband in about 2009.  Previous mental health services Have you ever been treated for a mental health problem, when, where, by whom? Yes  She found counseling in college helpful.  She did not find marital counseling helpful.   Are you currently seeing a therapist or counselor, counselor's name? No   Have you ever had a mental health hospitalization, how many times, length of stay? No   Have you ever been treated with medication, name, reason, response? Yes Wellbutrin, took something in past  Have you ever had suicidal thoughts or attempted suicide, when, how?    Risk factors for Suicide Demographic factors:  Caucasian Current mental status: None  Loss factors: Financial problems/change in socioeconomic status Historical factors: Family history of mental illness or substance abuse Risk Reduction factors: Responsible for children under 71 years of age, Sense of responsibility to family, Religious beliefs about death, Employed, Living with another person, especially a relative, Positive social support and Positive coping skills or problem solving skills Clinical factors:  Severe Anxiety and/or Agitation Depression:   Aggression Anhedonia Insomnia Cognitive features that contribute to risk: None    SUICIDE RISK:  Minimal: No identifiable suicidal ideation.  Patients presenting with no risk factors but with morbid  ruminations; may be classified as minimal risk based on the severity of the depressive symptoms  Medical history Medical treatment and/or problems, explain:   Do you have any issues with chronic pain?    Name of primary care physician/last physical exam:   Allergies:  Medication, reactions?    Current medications:  Prescribed by:  Is there any history of mental health problems or substance abuse in your family, whom?   Has anyone in your family been hospitalized, who, where, length of stay?    Social/family history Have you been married, how many times?  Two times/  First marriage 13 years, left marriage after relizing they wanted different things. Met second husband at high school reunion and began dating 10 months after 20 hyear high school reunion.    Do you have children?  Has three step daguthers 60 year old and 42 year old twins.  How many pregnancies have you had?    Who lives in your current household?   Military history:    Religious/spiritual involvement:  What religion/faith base are you?   Family of origin (childhood history)  Where were you born?  Where did you grow up?  How many different homes have you lived?  Describe the atmosphere of the household where you grew up:  Do you have siblings, step/half siblings, list names, relation, sex, age?    Are your parents separated/divorced, when and why?    Are your parents alive?    Social supports (personal and professional):   Education How many grades have you completed?  Did you have any problems in school, what type?   Medications prescribed for these problems?    Employment (financial issues)   Legal history   Trauma/Abuse history: Have you ever been exposed to any form of abuse, what type?    Have you ever been exposed to something traumatic, describe?    Substance use Do you use Caffeine?  Type, frequency?   Do you use Nicotine?  Type, frequency, ppd?    Do you use Alcohol?  Type,  frequency?   How old were you went you first tasted alcohol?  Was this accepted by your family?   When was your last drink, type, how much?   Have you ever used illicit drugs or taken more than prescribed, type, frequency, date of last usage?    Mental Status: General Appearance Luretha Murphy:   Eye Contact:   Motor Behavior:   Speech:   Level of Consciousness:   Mood:   Affect:   Anxiety Level:   Thought Process:   Thought Content:   Perception:   Judgment:   Insight:   Cognition:    Diagnosis AXIS I   AXIS II   AXIS III Past Medical History  Diagnosis Date  . Thyroid disease   . PCOS (polycystic ovarian syndrome)   . Arthritis     AXIS IV   AXIS V  Plan:   _________________________________________           Magnus Sinning. Charlean Merl, [place] LPC/ Date

## 2011-12-24 ENCOUNTER — Ambulatory Visit: Payer: Managed Care, Other (non HMO) | Admitting: Family Medicine

## 2011-12-27 ENCOUNTER — Ambulatory Visit (HOSPITAL_COMMUNITY): Payer: Self-pay | Admitting: Psychology

## 2011-12-28 ENCOUNTER — Ambulatory Visit (HOSPITAL_COMMUNITY): Payer: Self-pay | Admitting: Psychology

## 2012-01-07 ENCOUNTER — Ambulatory Visit (INDEPENDENT_AMBULATORY_CARE_PROVIDER_SITE_OTHER): Payer: Managed Care, Other (non HMO) | Admitting: Physician Assistant

## 2012-01-07 ENCOUNTER — Telehealth: Payer: Self-pay | Admitting: Physician Assistant

## 2012-01-07 ENCOUNTER — Encounter: Payer: Self-pay | Admitting: Physician Assistant

## 2012-01-07 VITALS — BP 120/74 | HR 89 | Ht 60.5 in | Wt 128.0 lb

## 2012-01-07 DIAGNOSIS — G43909 Migraine, unspecified, not intractable, without status migrainosus: Secondary | ICD-10-CM

## 2012-01-07 DIAGNOSIS — R29818 Other symptoms and signs involving the nervous system: Secondary | ICD-10-CM

## 2012-01-07 DIAGNOSIS — R2990 Unspecified symptoms and signs involving the nervous system: Secondary | ICD-10-CM

## 2012-01-07 DIAGNOSIS — Z131 Encounter for screening for diabetes mellitus: Secondary | ICD-10-CM

## 2012-01-07 DIAGNOSIS — Z13220 Encounter for screening for lipoid disorders: Secondary | ICD-10-CM

## 2012-01-07 MED ORDER — TOPIRAMATE 25 MG PO TABS: ORAL_TABLET | ORAL | Status: DC

## 2012-01-07 MED ORDER — KETOROLAC TROMETHAMINE 60 MG/2ML IM SOLN
60.0000 mg | Freq: Once | INTRAMUSCULAR | Status: AC
  Administered 2012-01-07: 60 mg via INTRAMUSCULAR

## 2012-01-07 NOTE — Telephone Encounter (Signed)
error 
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
## 2012-01-07 NOTE — Patient Instructions (Addendum)
Stop Elavil and then Start Topamax.  Will refer to neurology.

## 2012-01-10 ENCOUNTER — Encounter: Payer: Self-pay | Admitting: *Deleted

## 2012-01-10 ENCOUNTER — Emergency Department (HOSPITAL_COMMUNITY): Payer: Managed Care, Other (non HMO)

## 2012-01-10 ENCOUNTER — Emergency Department (HOSPITAL_COMMUNITY)
Admission: EM | Admit: 2012-01-10 | Discharge: 2012-01-10 | Disposition: A | Discharge status: Home / Self Care | Payer: Managed Care, Other (non HMO) | Attending: Emergency Medicine | Admitting: Emergency Medicine

## 2012-01-10 ENCOUNTER — Encounter (HOSPITAL_COMMUNITY): Payer: Self-pay | Admitting: Emergency Medicine

## 2012-01-10 DIAGNOSIS — E079 Disorder of thyroid, unspecified: Secondary | ICD-10-CM | POA: Insufficient documentation

## 2012-01-10 DIAGNOSIS — Z8673 Personal history of transient ischemic attack (TIA), and cerebral infarction without residual deficits: Secondary | ICD-10-CM | POA: Insufficient documentation

## 2012-01-10 DIAGNOSIS — R0602 Shortness of breath: Secondary | ICD-10-CM | POA: Insufficient documentation

## 2012-01-10 DIAGNOSIS — G43909 Migraine, unspecified, not intractable, without status migrainosus: Secondary | ICD-10-CM

## 2012-01-10 DIAGNOSIS — Z8742 Personal history of other diseases of the female genital tract: Secondary | ICD-10-CM | POA: Insufficient documentation

## 2012-01-10 DIAGNOSIS — Z8739 Personal history of other diseases of the musculoskeletal system and connective tissue: Secondary | ICD-10-CM | POA: Insufficient documentation

## 2012-01-10 DIAGNOSIS — R42 Dizziness and giddiness: Secondary | ICD-10-CM | POA: Insufficient documentation

## 2012-01-10 DIAGNOSIS — R51 Headache: Secondary | ICD-10-CM

## 2012-01-10 DIAGNOSIS — Z79899 Other long term (current) drug therapy: Secondary | ICD-10-CM | POA: Insufficient documentation

## 2012-01-10 DIAGNOSIS — I10 Essential (primary) hypertension: Secondary | ICD-10-CM | POA: Insufficient documentation

## 2012-01-10 HISTORY — DX: Transient cerebral ischemic attack, unspecified: G45.9

## 2012-01-10 HISTORY — DX: Essential (primary) hypertension: I10

## 2012-01-10 LAB — URINALYSIS, ROUTINE W REFLEX MICROSCOPIC
Ketones, ur: 15 mg/dL — AB
Leukocytes, UA: NEGATIVE
Nitrite: NEGATIVE
Specific Gravity, Urine: 1.012 (ref 1.005–1.030)
pH: 6.5 (ref 5.0–8.0)

## 2012-01-10 LAB — CBC WITH DIFFERENTIAL/PLATELET
Hemoglobin: 14.5 g/dL (ref 12.0–15.0)
Lymphs Abs: 2.4 10*3/uL (ref 0.7–4.0)
Monocytes Relative: 6 % (ref 3–12)
Neutro Abs: 7.5 10*3/uL (ref 1.7–7.7)
Neutrophils Relative %: 70 % (ref 43–77)
RBC: 4.83 MIL/uL (ref 3.87–5.11)

## 2012-01-10 LAB — POCT I-STAT, CHEM 8
Calcium, Ion: 1.2 mmol/L (ref 1.12–1.23)
Chloride: 106 mEq/L (ref 96–112)
HCT: 44 % (ref 36.0–46.0)
Potassium: 3.5 mEq/L (ref 3.5–5.1)

## 2012-01-10 LAB — URINE MICROSCOPIC-ADD ON

## 2012-01-10 LAB — POCT I-STAT TROPONIN I: Troponin i, poc: 0 ng/mL (ref 0.00–0.08)

## 2012-01-10 LAB — PROTIME-INR: INR: 0.98 (ref 0.00–1.49)

## 2012-01-10 LAB — POCT PREGNANCY, URINE: Preg Test, Ur: NEGATIVE

## 2012-01-10 MED ORDER — MAGNESIUM SULFATE 40 MG/ML IJ SOLN
2.0000 g | Freq: Once | INTRAMUSCULAR | Status: AC
  Administered 2012-01-10: 2 g via INTRAVENOUS
  Filled 2012-01-10: qty 50

## 2012-01-10 MED ORDER — VALPROATE SODIUM 500 MG/5ML IV SOLN
500.0000 mg | Freq: Once | INTRAVENOUS | Status: AC
  Administered 2012-01-10: 500 mg via INTRAVENOUS
  Filled 2012-01-10: qty 5

## 2012-01-10 MED ORDER — DEXAMETHASONE SODIUM PHOSPHATE 10 MG/ML IJ SOLN
10.0000 mg | Freq: Once | INTRAMUSCULAR | Status: AC
  Administered 2012-01-10: 10 mg via INTRAVENOUS
  Filled 2012-01-10: qty 1

## 2012-01-10 MED ORDER — SODIUM CHLORIDE 0.9 % IV BOLUS (SEPSIS)
1000.0000 mL | Freq: Once | INTRAVENOUS | Status: AC
  Administered 2012-01-10: 1000 mL via INTRAVENOUS

## 2012-01-10 MED ORDER — IOHEXOL 350 MG/ML SOLN
100.0000 mL | Freq: Once | INTRAVENOUS | Status: AC | PRN
  Administered 2012-01-10: 100 mL via INTRAVENOUS

## 2012-01-10 MED ORDER — DIPHENHYDRAMINE HCL 50 MG/ML IJ SOLN
12.5000 mg | Freq: Once | INTRAMUSCULAR | Status: AC
  Administered 2012-01-10: 12.5 mg via INTRAVENOUS
  Filled 2012-01-10: qty 1

## 2012-01-10 MED ORDER — METOCLOPRAMIDE HCL 5 MG/ML IJ SOLN
10.0000 mg | Freq: Once | INTRAMUSCULAR | Status: AC
  Administered 2012-01-10: 10 mg via INTRAVENOUS
  Filled 2012-01-10: qty 2

## 2012-01-10 MED ORDER — TRAMADOL HCL 50 MG PO TABS: 50.0000 mg | ORAL_TABLET | Freq: Four times a day (QID) | ORAL | Status: DC | PRN

## 2012-01-10 NOTE — ED Notes (Signed)
Po fluids to pt

## 2012-01-10 NOTE — ED Notes (Signed)
PT TRANSPORTED TO MRI.

## 2012-01-10 NOTE — ED Notes (Signed)
Patient to cdu to await results of mri and further observation for ha that has been ongoing for the past week. States has a history of migraine headaches but this one has not been typical in the fact it has lasted and has been a "blinding pain". Denies nausea.

## 2012-01-10 NOTE — ED Notes (Signed)
DR. Petra Kuba ( NEUROLOGIST) AT BEDSIDE EVALUATING PT., SPOUSE AT BEDSIDE.

## 2012-01-10 NOTE — ED Provider Notes (Signed)
History     CSN: [id number]  Arrival date & time 01/10/12  0022   First MD Initiated Contact with Patient 01/10/12 0032      Chief Complaint  Patient presents with  . Headache    (Consider location/radiation/quality/duration/timing/severity/associated sxs/prior treatment) Patient is a 41 y.o. female presenting with headaches and shortness of breath. The history is provided by the patient. No language interpreter was used.  Headache  This is a recurrent problem. The current episode started yesterday. The problem occurs constantly. The problem has not changed since onset.The headache is associated with nothing. The pain is located in the right unilateral region. The quality of the pain is described as throbbing. The pain is severe. The pain does not radiate. Associated symptoms include shortness of breath. Pertinent negatives include no anorexia, no fever, no malaise/fatigue, no orthopnea, no palpitations, no syncope, no nausea and no vomiting. She has tried NSAIDs for the symptoms. The treatment provided no relief.  Shortness of Breath  The current episode started yesterday. The onset was gradual. The problem occurs occasionally. The problem has been unchanged. The problem is moderate. Nothing relieves the symptoms. The symptoms are aggravated by activity. Associated symptoms include shortness of breath. Pertinent negatives include no orthopnea, no fever, no rhinorrhea, no cough and no wheezing. There was no intake of a foreign body. She has not inhaled smoke recently. Her past medical history does not include asthma. She has been behaving normally. Urine output has been normal. Recently, medical care has been given at another facility. Services received include medications given and tests performed.    Past Medical History  Diagnosis Date  . Thyroid disease   . PCOS (polycystic ovarian syndrome)   . Arthritis   . TIA (transient ischemic attack)   . Hypertension     Past Surgical  History  Procedure Date  . Melanoma removal     RT SIDE  . Lymph nodes   . 2 ectopic pregnancies [id number]  . Removal of fallopian tube 1999    Family History  Problem Relation Age of Onset  . Melanoma Mother   . Depression Mother   . Hyperlipidemia Mother   . Colon cancer Maternal Grandmother   . Colon cancer Father 15    deceased.     History  Substance Use Topics  . Smoking status: Never Smoker   . Smokeless tobacco: Never Used  . Alcohol Use: No    OB History    Grav Para Term Preterm Abortions TAB SAB Ect Mult Living                  Review of Systems  Constitutional: Negative for fever and malaise/fatigue.  HENT: Negative for rhinorrhea and neck pain.   Respiratory: Positive for shortness of breath. Negative for cough and wheezing.   Cardiovascular: Negative for palpitations, orthopnea and syncope.  Gastrointestinal: Negative for nausea, vomiting and anorexia.  Neurological: Positive for light-headedness and headaches. Negative for facial asymmetry, weakness and numbness.  All other systems reviewed and are negative.    Allergies  Sulfonamide derivatives  Home Medications   Current Outpatient Rx  Name  Route  Sig  Dispense  Refill  . ASPIRIN-ACETAMINOPHEN-CAFFEINE 250-250-65 MG PO TABS   Oral   Take 1-2 tablets by mouth every 6 (six) hours as needed.         Marland Kitchen BIOTIN PO   Oral   Take 1 tablet by mouth daily.         . BUPROPION  HCL ER (XL) 150 MG PO TB24   Oral   Take 1 tablet (150 mg total) by mouth daily.   30 tablet   1   . CYANOCOBALAMIN 1000 MCG/ML IJ SOLN   Intramuscular   Inject 1,000 mcg into the muscle once.         . B-12 PO   Oral   Take 1 tablet by mouth daily.         Marland Kitchen FLUTICASONE PROPIONATE 50 MCG/ACT NA SUSP   Nasal   Place 2 sprays into the nose daily as needed. For seasonal allergy nasal congestion         . POLYSACCHARIDE IRON COMPLEX 150 MG PO CAPS   Oral   Take 1 capsule (150 mg total) by mouth 2 (two)  times daily.   60 capsule   6   . LEVOTHYROXINE SODIUM 137 MCG PO TABS   Oral   Take 1 tablet (137 mcg total) by mouth daily.   30 tablet   4   . LOSARTAN POTASSIUM 25 MG PO TABS   Oral   Take 1 tablet (25 mg total) by mouth daily.   90 tablet   1   . METFORMIN HCL 1000 MG PO TABS   Oral   Take 1,000 mg by mouth 2 (two) times daily with a meal.           . SUMATRIPTAN SUCCINATE 25 MG PO TABS   Oral   Take 25 mg by mouth every 2 (two) hours as needed. Migraine headaches         . TOPIRAMATE 25 MG PO TABS      Take 1 tab at night for 1 week then increase to 2 tabs at night.   60 tablet   0   . ZOLPIDEM TARTRATE 10 MG PO TABS   Oral   Take 1 tablet (10 mg total) by mouth at bedtime as needed for sleep.   90 tablet   0     BP 128/97  Pulse 84  Temp 98.7 F (37.1 C) (Oral)  Resp 18  SpO2 99%  LMP 12/11/2011  Physical Exam  Constitutional: She is oriented to person, place, and time. She appears well-developed and well-nourished. She appears distressed.  HENT:  Head: Normocephalic and atraumatic.  Mouth/Throat: Oropharynx is clear and moist.  Eyes: EOM are normal. Pupils are equal, round, and reactive to light.  Neck: Normal range of motion. Neck supple.  Cardiovascular: Normal rate, regular rhythm and intact distal pulses.   Pulmonary/Chest: Effort normal and breath sounds normal. No respiratory distress. She has no wheezes. She has no rales.  Abdominal: Soft. Bowel sounds are normal. There is no tenderness. There is no rebound and no guarding.  Musculoskeletal: Normal range of motion. She exhibits no edema.  Neurological: She is alert and oriented to person, place, and time. She has normal reflexes. She displays normal reflexes. No cranial nerve deficit.  Skin: Skin is warm and dry.  Psychiatric: She has a normal mood and affect.    ED Course  Procedures (including critical care time)  Labs Reviewed  CBC WITH DIFFERENTIAL - Abnormal; Notable for the  following:    WBC 10.7 (*)     All other components within normal limits  POCT I-STAT, CHEM 8  PROTIME-INR  URINALYSIS, ROUTINE W REFLEX MICROSCOPIC   No results found.   No diagnosis found.    MDM   Date: 01/10/2012  Rate: 75  Rhythm: normal sinus  rhythm  QRS Axis: normal  Intervals: normal  ST/T Wave abnormalities: normal  Conduction Disutrbances: none  Narrative Interpretation: unremarkable      Seen by neurology if MRV of head negative for dural sinus thrombosis.  Follow up with PMD.  Will need outpatient liver ultrasound.  Inform our doctor.  Information provided on discharge paperwork     Morad Tal K Azia Toutant-Rasch, MD 01/10/12 [phone]

## 2012-01-10 NOTE — ED Provider Notes (Signed)
8:16am  A shunt placed in the CDU to await an MRI venogram of her head for chronic head pain. Neurology has been consulted and recommended this test. The test is normal and shows no thrombosis. Patient has not had a stroke, brain swelling, extra fluid on the brain. Asians tests are all essentially normal. She had a CT angiography chest which was normal aside from a 2.1 cm lesion on her liver.  I discussed the results of the lesion on her liver and she said that she knows about this already and that a couple years back at [facility] she was worked up extensively for this lesion. She is unsure 2.1 cm is larger or smaller than the initial lesion. She has promised to advise her primary care doctor about this.  She wants to know what she can take for pain while she works her Topamax to kick in. She said the neurologist said not to take Excedrin or Imitrex anymore. I will give her some pain reliever medication while she waits for this to kick in.  Patient will get a pain management referral. She is to followup with primary care Dr.  Rock Nephew has been advised of the symptoms that warrant their return to the ED. Patient has voiced understanding and has agreed to follow-up with the PCP or specialist.   Dorthula Matas, PA 01/10/12 [phone]

## 2012-01-10 NOTE — Consult Note (Signed)
Reason for Consult: Headache Referring Physician: Palumbo-Rasch, A  CC: Headache  History is obtained from: Patient, Husband  HPI: Carly [name] is a 41 y.o. female with a history of chronic daily headaches as well as migraine who had an episode of transient neurological symptoms last Thursday. She was driving and began having vision problems, asking her daughter if there were two vans in front of her when there were no vans.  She subsequently became unable to speak and was taken to [facility] where she was seen to just let her arms and legs fall to the bed bilaterally. She was not talking. A CT head was done and was negative and she was treated with IV tPA. She improved and came back to baseline over 24 hours and was discharged from [facility] with the diagnosis of complicated migraine. Since discharge, she has again had persistent mild problems with word finding and headaches. MRI was negative as was MRA head(per discharge summary). Her current headache is associated with photophobia.   She typically has a headache every day. She takes Excedrin 4 - 6 times daily for her headaches at baseline.   ROS: A 14 point ROS was performed and is negative except as noted in the HPI.  Past Medical History  Diagnosis Date  . Thyroid disease   . PCOS (polycystic ovarian syndrome)   . Arthritis   . TIA (transient ischemic attack)   . Hypertension     Family History: Depression, cancer  Social History: Tob: Never smoker  Exam: Current vital signs: BP 134/93  Pulse 81  Temp 98.7 F (37.1 C) (Oral)  Resp 14  SpO2 99%  LMP 12/11/2011 Vital signs in last 24 hours: Temp:  [98 F (36.7 C)-98.7 F (37.1 C)] 98.7 F (37.1 C) (12/19 0105) Pulse Rate:  [81-84] 81  (12/19 0151) Resp:  [14-18] 14  (12/19 0151) BP: (128-134)/(93-97) 134/93 mmHg (12/19 0151) SpO2:  [99 %] 99 % (12/19 0151)  General: In bed, NAD CV: RRR Mental Status: Patient is awake, alert, oriented to person,  place, month, year, and situation. Immediate and remote memory are intact with the exception of the day of admission to [facility].  Patient is able to give a clear and coherent history otherwise She is able to name all items on the stroke cards and repeat without difficulty. Speech is spontaneous and fluent.  Cranial Nerves: II: Visual Fields are full. Pupils are equal, round, and reactive to light.  Discs are without papilledema. III,IV, VI: EOMI without ptosis or diploplia.  V: Facial sensation is symmetric to temperature VII: Facial movement is symmetric.  VIII: hearing is intact to voice X: Uvula elevates symmetrically XI: Shoulder shrug is symmetric. XII: tongue is midline without atrophy or fasciculations.  Motor: Tone is normal. Bulk is normal. 5/5 strength was present in all four extremities.  Sensory: Sensation is symmetric to light touch and temperature in the arms and legs. Deep Tendon Reflexes: 2+ and symmetric in the biceps and patellae.  Plantars: Toes are downgoing bilaterally.  Cerebellar: FNF and HKS are intact bilaterally Gait: Was not tested   I have reviewed labs in epic and the results pertinent to this consultation are: BMP - unremarkable D-Dimer - positive.   I have reviewed the images obtained: CT head - WNL  Impression: 41 yo F with transient amnestic episode and persistent worsening of her chronic headaches. Complicated migraine is possible, and her normal MRI/MRA are reassuring. An infectious process would not have been expected to  have improved so quickly following onset and she does not have any deficits present on exam today. Venous sinus thrombosis is a possibility, though I feel it is a relatively low possibility.   Recommendations: 1) MRV head 2) If negative would treat as complicated migraine.  3) Could start with reglan+ benadryl, if no improvement, would try magnesium 2gm + depacon 1000mg  IV x1.  4) Could continue Topamax for chronic headaches   5) Rebound headache was discussed with the patient and she was counseled to stop analgesic medications for 2 - 3 weeks.   Ritta Slot, MD [facility] [phone]  If 7pm- 7am, please page neurology on call at [phone].

## 2012-01-10 NOTE — ED Notes (Addendum)
DR. Petra Kuba EXPLAINED PLAN OF CARE TO PT. / PT. TRANSPORTED TO CT SCAN.

## 2012-01-10 NOTE — ED Notes (Signed)
C/o increased frequency of chronic headaches over the past week.  Reports sob with exertion and "a little bit" of dizziness since Wednesday morning.  Pt states she received tPA last week at [facility] and was transferred to [facility].  Denies weakness/numbness.

## 2012-01-10 NOTE — ED Notes (Signed)
TRANSPORTED TO RADIOLOGY.

## 2012-01-11 NOTE — ED Provider Notes (Signed)
Medical screening examination/treatment/procedure(s) were performed by non-physician practitioner and as supervising physician I was immediately available for consultation/collaboration.   Katrin Grabel W Marlin Jarrard, MD 01/11/12 0732 

## 2012-01-18 ENCOUNTER — Ambulatory Visit (INDEPENDENT_AMBULATORY_CARE_PROVIDER_SITE_OTHER): Payer: Managed Care, Other (non HMO) | Admitting: Physician Assistant

## 2012-01-18 ENCOUNTER — Encounter: Payer: Self-pay | Admitting: Physician Assistant

## 2012-01-18 VITALS — BP 114/73 | HR 83 | Temp 97.8°F | Ht 60.0 in | Wt 127.0 lb

## 2012-01-18 DIAGNOSIS — G43909 Migraine, unspecified, not intractable, without status migrainosus: Secondary | ICD-10-CM

## 2012-01-18 MED ORDER — CYCLOBENZAPRINE HCL 10 MG PO TABS: 10.0000 mg | ORAL_TABLET | Freq: Three times a day (TID) | ORAL | Status: DC | PRN

## 2012-01-18 MED ORDER — TRAMADOL HCL 50 MG PO TABS: 50.0000 mg | ORAL_TABLET | Freq: Four times a day (QID) | ORAL | Status: DC | PRN

## 2012-01-18 MED ORDER — TOPIRAMATE 50 MG PO TABS: 50.0000 mg | ORAL_TABLET | Freq: Two times a day (BID) | ORAL | Status: DC

## 2012-01-18 MED ORDER — SUMATRIPTAN SUCCINATE 100 MG PO TABS: 100.0000 mg | ORAL_TABLET | ORAL | Status: DC | PRN

## 2012-01-18 NOTE — Patient Instructions (Addendum)
Increase Topamax 50mg   twice a day. Imitrex for acute headaches. Tramadol for break through pain.   Flexeril to use for neck tightness and spasms. Heat and range of motion exercise.   Encouraged patient to consider massage therapy balance baseball to help relieve some tension and if migraines are a result of muscular tightness.

## 2012-01-18 NOTE — Progress Notes (Signed)
  Subjective:    Patient ID: Carly [name], female    DOB: 1970/07/27, 41 y.o.   MRN: [id number]  HPI  patient presents to the clinic to followup on migraines. Patient was seen by me couple weeks ago after being in admitted to the hospital stroke like symptoms and given TPA. She continues to have the stool frontal and behind the eyes headache that are debilitating and cause her not to be able to function. She has had a headache every day since incident of stroke like symptoms. She has a history of migraines that have never been at this level. She went to: ED last week and was told to stop Imitrex and Excedrin. The only prevention she's off her headaches is Topamax and breakthrough tramadol which helps some. Her headaches have become much worse since stopping Imitrex and Excedrin. She has an appointment scheduled with neurologist on January 14. She really wants to get back to work in a can get headaches into control pills that she might be want to do so. She still complains of a lot of neck tension. She has a laminated all the trigger causes she can think such as caffeine, and chocolate, lack of sleep etc. She is very sensitive to light but denies any nausea or vomiting.   Review of Systems     Objective:   Physical Exam  Constitutional: She is oriented to person, place, and time. She appears well-developed and well-nourished.  HENT:  Head: Normocephalic and atraumatic.  Neck: Normal range of motion. Neck supple.       Trapezius and up into neck very tight to palpation.  Cardiovascular: Normal rate, regular rhythm and normal heart sounds.   Neurological: She is alert and oriented to person, place, and time.  Skin: Skin is warm and dry.  Psychiatric: She has a normal mood and affect. Her behavior is normal.          Assessment & Plan:  Migraines-increase Topamax to 50 mg twice a day. Despite neurologist at home telling her to stop Imitrex and feels working I did give her Imitrex 100 mg to  use at onset of acute headache and then 2 hours later but not to use more and then 2 in any 24-hour period. She was also given a few tramadol to use only as needed for breakthrough pain when she had taken all Imitrex she could for the acute episode. Because of think there might be a tension component discussed Flexeril at bedtime. Also encouraged patient to get a therapeutic massage therapist on the neck and shoulders. Hopefully this will get her to her neurologist the point where they can come up with another game plan of most beneficial treatment to get headaches under control.

## 2012-01-25 ENCOUNTER — Telehealth: Payer: Self-pay | Admitting: *Deleted

## 2012-01-25 ENCOUNTER — Encounter (HOSPITAL_COMMUNITY): Payer: Self-pay | Admitting: Psychology

## 2012-01-25 DIAGNOSIS — F339 Major depressive disorder, recurrent, unspecified: Secondary | ICD-10-CM

## 2012-01-25 NOTE — Telephone Encounter (Signed)
Pt calls and states she is returning to work on 01/28/2012 and her employer needs a note from you stating it is ok for her to return to work. Call when ready to pick up.

## 2012-01-25 NOTE — Progress Notes (Signed)
Patient ID: Carly [name], female   DOB: 05/01/1970, 42 y.o.   MRN: [id number]  Outpatient Therapist Discharge Summary    Discharge Date:  01/25/2012 Reason for Discharge:  Patient attended one session and has not been seen since 12/14/2011. Diagnosis:  Axis I:   1. Major depression, recurrent     Axis II:  None  Axis III:  HTN, hypothyroidism, melanoma, insomnia, headache, back pain, anemia, PCOS, osteoarthritis    Comments:  Resume therapy as requested.  Salley Scarlet

## 2012-01-25 NOTE — Telephone Encounter (Signed)
Ok to write note for patient to return to work on the date below. Please call pt when ready.

## 2012-01-28 ENCOUNTER — Other Ambulatory Visit: Payer: Self-pay | Admitting: Physician Assistant

## 2012-01-28 ENCOUNTER — Encounter: Payer: Self-pay | Admitting: *Deleted

## 2012-01-28 NOTE — Telephone Encounter (Signed)
Pt notified and letter printed

## 2012-01-29 ENCOUNTER — Other Ambulatory Visit: Payer: Self-pay | Admitting: Family Medicine

## 2012-02-01 ENCOUNTER — Telehealth: Payer: Self-pay | Admitting: *Deleted

## 2012-02-01 NOTE — Telephone Encounter (Signed)
Been recently put on Topamax- recently hands started tingling, dry mouth, blurred vision off and on, problems with concentration. Not really controlling the H/A either. Going to neuro on Tuesday.

## 2012-02-01 NOTE — Telephone Encounter (Signed)
These are all definitely S.E of the the topamax.  I recommend she stop it and the neuro will have to try her on something else.  I hate to call in something new and he/she has a different plan in mind.

## 2012-02-01 NOTE — Telephone Encounter (Signed)
Pt.notified

## 2012-02-13 ENCOUNTER — Encounter: Payer: Self-pay | Admitting: Physician Assistant

## 2012-02-13 ENCOUNTER — Ambulatory Visit (INDEPENDENT_AMBULATORY_CARE_PROVIDER_SITE_OTHER): Payer: Managed Care, Other (non HMO) | Admitting: Physician Assistant

## 2012-02-13 VITALS — BP 139/94 | HR 92 | Wt 126.0 lb

## 2012-02-13 DIAGNOSIS — N390 Urinary tract infection, site not specified: Secondary | ICD-10-CM

## 2012-02-13 DIAGNOSIS — R300 Dysuria: Secondary | ICD-10-CM

## 2012-02-13 DIAGNOSIS — G43909 Migraine, unspecified, not intractable, without status migrainosus: Secondary | ICD-10-CM

## 2012-02-13 LAB — POCT URINALYSIS DIPSTICK
Glucose, UA: 250
Nitrite, UA: POSITIVE
Protein, UA: >=300
Urobilinogen, UA: >=8

## 2012-02-13 MED ORDER — CIPROFLOXACIN HCL 500 MG PO TABS: 500.0000 mg | ORAL_TABLET | Freq: Two times a day (BID) | ORAL | Status: DC

## 2012-02-13 NOTE — Patient Instructions (Addendum)
Urinary Tract Infection Urinary tract infections (UTIs) can develop anywhere along your urinary tract. Your urinary tract is your body's drainage system for removing wastes and extra water. Your urinary tract includes two kidneys, two ureters, a bladder, and a urethra. Your kidneys are a pair of bean-shaped organs. Each kidney is about the size of your fist. They are located below your ribs, one on each side of your spine. CAUSES Infections are caused by microbes, which are microscopic organisms, including fungi, viruses, and bacteria. These organisms are so small that they can only be seen through a microscope. Bacteria are the microbes that most commonly cause UTIs. SYMPTOMS  Symptoms of UTIs may vary by age and gender of the patient and by the location of the infection. Symptoms in young women typically include a frequent and intense urge to urinate and a painful, burning feeling in the bladder or urethra during urination. Older women and men are more likely to be tired, shaky, and weak and have muscle aches and abdominal pain. A fever may mean the infection is in your kidneys. Other symptoms of a kidney infection include pain in your back or sides below the ribs, nausea, and vomiting. DIAGNOSIS To diagnose a UTI, your caregiver will ask you about your symptoms. Your caregiver also will ask to provide a urine sample. The urine sample will be tested for bacteria and white blood cells. White blood cells are made by your body to help fight infection. TREATMENT  Typically, UTIs can be treated with medication. Because most UTIs are caused by a bacterial infection, they usually can be treated with the use of antibiotics. The choice of antibiotic and length of treatment depend on your symptoms and the type of bacteria causing your infection. HOME CARE INSTRUCTIONS  If you were prescribed antibiotics, take them exactly as your caregiver instructs you. Finish the medication even if you feel better after you  have only taken some of the medication.  Drink enough water and fluids to keep your urine clear or pale yellow.  Avoid caffeine, tea, and carbonated beverages. They tend to irritate your bladder.  Empty your bladder often. Avoid holding urine for long periods of time.  Empty your bladder before and after sexual intercourse.  After a bowel movement, women should cleanse from front to back. Use each tissue only once. SEEK MEDICAL CARE IF:   You have back pain.  You develop a fever.  Your symptoms do not begin to resolve within 3 days. SEEK IMMEDIATE MEDICAL CARE IF:   You have severe back pain or lower abdominal pain.  You develop chills.  You have nausea or vomiting.  You have continued burning or discomfort with urination. MAKE SURE YOU:   Understand these instructions.  Will watch your condition.  Will get help right away if you are not doing well or get worse. Document Released: 10/18/2004 Document Revised: 07/10/2011 Document Reviewed: 02/16/2011 [employer] Patient Information 2013 [employer].  

## 2012-02-13 NOTE — Progress Notes (Signed)
  Subjective:    Patient ID: Carly [name], female    DOB: 1970/05/02, 42 y.o.   MRN: [id number]  HPI Patient is a 42 year old female who presents to the clinic with 2 days of urinary frequency, urgency and discomfort. She has had UTI in the past but not in many years. She denies any fever, chills, flank pain, n/v/d. She did take azo and did help with discomfort. She has tried to drink plenty of water.   She did see neurologist and confirms she believes headaches stem from migraines. Pt was not able to tolerate topomax it gave her numbness and tingling of bilateral hands. She was put on notripyline and has increased up to twice a day. Headaches are still every day. She does have imitrex to use for bad migraine headaches. It does dull the pain. She is very sensitive to light. She wears her sunglasses a lot. She has a lot of neck tension. She has tried getting massage and does not seem to help.   Review of Systems     Objective:   Physical Exam  Constitutional: She is oriented to person, place, and time. She appears well-developed and well-nourished.  HENT:  Head: Normocephalic and atraumatic.  Cardiovascular: Normal rate, regular rhythm and normal heart sounds.   Pulmonary/Chest: Effort normal and breath sounds normal.       No CVA tenderness.  Abdominal: Soft. Bowel sounds are normal. There is no tenderness.  Neurological: She is alert and oriented to person, place, and time. She exhibits normal muscle tone.  Skin: Skin is warm and dry.  Psychiatric: She has a normal mood and affect. Her behavior is normal.          Assessment & Plan:  UTI- Treated with Cipro for 3 days. Gave handout with symptomatic care. Continue to drink water. Can continue to take azo for 2 more days if helping with bladder dyscomfort. Can include motrin and tylenol also.   Migraines- Discussed with patient to talk with neurologist about having pt take a muscle relaxer at bedtime every night to help stay ahead  of any tension headaches. This might be an option. Will continue to let neurologist monitor.

## 2012-03-13 ENCOUNTER — Other Ambulatory Visit: Payer: Self-pay | Admitting: *Deleted

## 2012-03-13 MED ORDER — ZOLPIDEM TARTRATE 10 MG PO TABS: 10.0000 mg | ORAL_TABLET | Freq: Every evening | ORAL | Status: DC | PRN

## 2012-03-13 MED ORDER — BUPROPION HCL ER (XL) 150 MG PO TB24: ORAL_TABLET | ORAL | Status: DC

## 2012-03-24 ENCOUNTER — Ambulatory Visit (INDEPENDENT_AMBULATORY_CARE_PROVIDER_SITE_OTHER): Payer: Managed Care, Other (non HMO) | Admitting: Physician Assistant

## 2012-03-24 ENCOUNTER — Encounter: Payer: Self-pay | Admitting: Physician Assistant

## 2012-03-24 VITALS — BP 141/80 | HR 100 | Wt 131.0 lb

## 2012-03-24 DIAGNOSIS — H6120 Impacted cerumen, unspecified ear: Secondary | ICD-10-CM

## 2012-03-24 DIAGNOSIS — J3489 Other specified disorders of nose and nasal sinuses: Secondary | ICD-10-CM

## 2012-03-24 DIAGNOSIS — J309 Allergic rhinitis, unspecified: Secondary | ICD-10-CM

## 2012-03-24 DIAGNOSIS — J302 Other seasonal allergic rhinitis: Secondary | ICD-10-CM

## 2012-03-24 DIAGNOSIS — N390 Urinary tract infection, site not specified: Secondary | ICD-10-CM

## 2012-03-24 DIAGNOSIS — R300 Dysuria: Secondary | ICD-10-CM

## 2012-03-24 DIAGNOSIS — H6123 Impacted cerumen, bilateral: Secondary | ICD-10-CM

## 2012-03-24 LAB — POCT URINALYSIS DIPSTICK
Nitrite, UA: POSITIVE
pH, UA: 7

## 2012-03-24 MED ORDER — CIPROFLOXACIN HCL 500 MG PO TABS: 500.0000 mg | ORAL_TABLET | Freq: Two times a day (BID) | ORAL | Status: DC

## 2012-03-24 MED ORDER — METHYLPREDNISOLONE SODIUM SUCC 125 MG IJ SOLR
125.0000 mg | Freq: Once | INTRAMUSCULAR | Status: AC
  Administered 2012-03-24: 125 mg via INTRAMUSCULAR

## 2012-03-24 NOTE — Progress Notes (Signed)
  Subjective:    Patient ID: Carly [name], female    DOB: [birth year], 42 y.o.   MRN: [id number]  Urinary Tract Infection  This is a new problem. The current episode started in the past 7 days (for 3 days. ). The problem occurs every urination. The problem has been gradually worsening. The quality of the pain is described as burning and shooting. The pain is at a severity of 5/10. The pain is mild. There has been no fever. She is sexually active. There is no history of pyelonephritis. Associated symptoms include frequency and urgency. Pertinent negatives include no chills, discharge, flank pain, hematuria, hesitancy, nausea, possible pregnancy, sweats or vomiting. She has tried increased fluids, acetaminophen and NSAIDs (AZO.) for the symptoms. The treatment provided mild relief. She did recently have UTI 2 months ago.    Patient is also having a lot of your congestion and sinus pressure. This has been ongoing for the last month but worse the past week. Patient had previously been prescribed Flonase but has stopped using it. She does not take over-the-counter antihistamines because they have just not work for her allergies in the past. She does have a history of allergies and Flonase usually works for them. Denies any fever, chills, n/v/d. Ears feel very congestion but do not hurt.    Review of Systems  Constitutional: Negative for chills.  Gastrointestinal: Negative for nausea and vomiting.  Genitourinary: Positive for urgency and frequency. Negative for hesitancy, hematuria and flank pain.       Objective:   Physical Exam  Constitutional: She is oriented to person, place, and time. She appears well-developed.  HENT:  Head: Normocephalic and atraumatic.  Bilateral ear impaction. After irrigation Tm's clear but present with air bubbles behind TM.  NO erythema in canal.   No maxillary or frontal tenderness.   Turbinates very erythematous.   Neck: Normal range of motion. Neck supple.   Cardiovascular: Normal rate, regular rhythm and normal heart sounds.   Pulmonary/Chest: Effort normal and breath sounds normal. She has no wheezes.  NO CVA tenderness.  Abdominal: Soft. Bowel sounds are normal. There is no tenderness.  Lymphadenopathy:    She has no cervical adenopathy.  Neurological: She is alert and oriented to person, place, and time.  Skin: Skin is warm and dry.  Psychiatric: She has a normal mood and affect. Her behavior is normal.          Assessment & Plan:  UTI- UA positive for blood, leuks, and nitrates.patient was treated with Cipro for 7 days. Symptomatic care was discussed with patient. We did talk about if infections become more frequent we could consider post coital treatment. Followup as needed. Call if not improving.   Cerumen impaction/sinus pressure/ear congestion- nurse performed bilateral ear irrigation. Patient reported to be very dizzy after procedure and laid down for about 20 minutes. Encouraged pt to start back using nasal spray. I did give solumedrol 125mg  for inflammation in head and allergies as well as pressure. Call if not improving. If continues to be dizzy try antivert.

## 2012-03-24 NOTE — Patient Instructions (Addendum)
cipro for 7 days.  Will send flonase to start daily.   Decrease sprite intake.    Urinary Tract Infection Urinary tract infections (UTIs) can develop anywhere along your urinary tract. Your urinary tract is your body's drainage system for removing wastes and extra water. Your urinary tract includes two kidneys, two ureters, a bladder, and a urethra. Your kidneys are a pair of bean-shaped organs. Each kidney is about the size of your fist. They are located below your ribs, one on each side of your spine. CAUSES Infections are caused by microbes, which are microscopic organisms, including fungi, viruses, and bacteria. These organisms are so small that they can only be seen through a microscope. Bacteria are the microbes that most commonly cause UTIs. SYMPTOMS  Symptoms of UTIs may vary by age and gender of the patient and by the location of the infection. Symptoms in young women typically include a frequent and intense urge to urinate and a painful, burning feeling in the bladder or urethra during urination. Older women and men are more likely to be tired, shaky, and weak and have muscle aches and abdominal pain. A fever may mean the infection is in your kidneys. Other symptoms of a kidney infection include pain in your back or sides below the ribs, nausea, and vomiting. DIAGNOSIS To diagnose a UTI, your caregiver will ask you about your symptoms. Your caregiver also will ask to provide a urine sample. The urine sample will be tested for bacteria and white blood cells. White blood cells are made by your body to help fight infection. TREATMENT  Typically, UTIs can be treated with medication. Because most UTIs are caused by a bacterial infection, they usually can be treated with the use of antibiotics. The choice of antibiotic and length of treatment depend on your symptoms and the type of bacteria causing your infection. HOME CARE INSTRUCTIONS  If you were prescribed antibiotics, take them exactly as  your caregiver instructs you. Finish the medication even if you feel better after you have only taken some of the medication.  Drink enough water and fluids to keep your urine clear or pale yellow.  Avoid caffeine, tea, and carbonated beverages. They tend to irritate your bladder.  Empty your bladder often. Avoid holding urine for long periods of time.  Empty your bladder before and after sexual intercourse.  After a bowel movement, women should cleanse from front to back. Use each tissue only once. SEEK MEDICAL CARE IF:   You have back pain.  You develop a fever.  Your symptoms do not begin to resolve within 3 days. SEEK IMMEDIATE MEDICAL CARE IF:   You have severe back pain or lower abdominal pain.  You develop chills.  You have nausea or vomiting.  You have continued burning or discomfort with urination. MAKE SURE YOU:   Understand these instructions.  Will watch your condition.  Will get help right away if you are not doing well or get worse. Document Released: 10/18/2004 Document Revised: 07/10/2011 Document Reviewed: 02/16/2011 [facility] Patient Information 2013 [place], [place].

## 2012-03-27 ENCOUNTER — Telehealth: Payer: Self-pay | Admitting: *Deleted

## 2012-03-27 NOTE — Telephone Encounter (Signed)
Pt calls & states that she doesn't think the cipro is working for her UTI.  She states that she is still experiencing pain & urgency.

## 2012-03-31 NOTE — Telephone Encounter (Signed)
LMOM for pt to call back.

## 2012-03-31 NOTE — Telephone Encounter (Signed)
I would suggest trying a decongestant for a few days to dry up any excess fluid in ears. I'm glad you are feeling better. Sorry for the issues getting you another med due to weather and power.

## 2012-03-31 NOTE — Telephone Encounter (Signed)
Pt.notified

## 2012-03-31 NOTE — Telephone Encounter (Signed)
Pt called back & states that she had to go to the ED & they gave her IV rocefin as well as an oral abx.  Pt states that she is feeling better but her ears are still bothering her.

## 2012-03-31 NOTE — Telephone Encounter (Signed)
Please find out how she is doing since it has been a few days now? If not will send another abx in?

## 2012-05-02 ENCOUNTER — Other Ambulatory Visit: Payer: Self-pay | Admitting: *Deleted

## 2012-05-02 MED ORDER — FLUTICASONE PROPIONATE 50 MCG/ACT NA SUSP: 2.0000 | Freq: Every day | NASAL | Status: AC | PRN

## 2012-05-27 ENCOUNTER — Other Ambulatory Visit: Payer: Self-pay | Admitting: Family Medicine

## 2012-05-27 ENCOUNTER — Telehealth: Payer: Self-pay | Admitting: Family Medicine

## 2012-05-27 NOTE — Telephone Encounter (Signed)
Please call patient. Please find out if we take care of her diabetes or not. If we do then she is to schedule appointment as we have not addressed this in quite some time. If she sees an endocrinologist then please let us know so that we can update her chart.

## 2012-05-28 NOTE — Telephone Encounter (Signed)
Pt problem list has PCOS listed .Carly [name] [place]

## 2012-06-23 ENCOUNTER — Other Ambulatory Visit: Payer: Self-pay | Admitting: Family Medicine

## 2012-06-25 ENCOUNTER — Ambulatory Visit (INDEPENDENT_AMBULATORY_CARE_PROVIDER_SITE_OTHER): Payer: Managed Care, Other (non HMO) | Admitting: Family Medicine

## 2012-06-25 ENCOUNTER — Other Ambulatory Visit (HOSPITAL_COMMUNITY)
Admission: RE | Admit: 2012-06-25 | Discharge: 2012-06-25 | Disposition: A | Discharge status: Home / Self Care | Payer: Managed Care, Other (non HMO) | Source: Ambulatory Visit | Attending: Family Medicine | Admitting: Family Medicine

## 2012-06-25 ENCOUNTER — Encounter: Payer: Self-pay | Admitting: Family Medicine

## 2012-06-25 VITALS — BP 113/72 | HR 81 | Ht 60.25 in | Wt 136.0 lb

## 2012-06-25 DIAGNOSIS — D509 Iron deficiency anemia, unspecified: Secondary | ICD-10-CM

## 2012-06-25 DIAGNOSIS — E559 Vitamin D deficiency, unspecified: Secondary | ICD-10-CM

## 2012-06-25 DIAGNOSIS — Z1231 Encounter for screening mammogram for malignant neoplasm of breast: Secondary | ICD-10-CM

## 2012-06-25 DIAGNOSIS — Z01419 Encounter for gynecological examination (general) (routine) without abnormal findings: Secondary | ICD-10-CM

## 2012-06-25 DIAGNOSIS — Z124 Encounter for screening for malignant neoplasm of cervix: Secondary | ICD-10-CM

## 2012-06-25 DIAGNOSIS — E538 Deficiency of other specified B group vitamins: Secondary | ICD-10-CM

## 2012-06-25 DIAGNOSIS — E039 Hypothyroidism, unspecified: Secondary | ICD-10-CM

## 2012-06-25 LAB — COMPLETE METABOLIC PANEL WITH GFR
ALT: 11 U/L (ref 0–35)
AST: 12 U/L (ref 0–37)
Albumin: 4.2 g/dL (ref 3.5–5.2)
Calcium: 8.7 mg/dL (ref 8.4–10.5)
Chloride: 105 mEq/L (ref 96–112)
Potassium: 4.4 mEq/L (ref 3.5–5.3)
Sodium: 139 mEq/L (ref 135–145)

## 2012-06-25 LAB — CBC
MCH: 30.1 pg (ref 26.0–34.0)
Platelets: 357 10*3/uL (ref 150–400)
RBC: 4.19 MIL/uL (ref 3.87–5.11)
RDW: 13.5 % (ref 11.5–15.5)
WBC: 6.2 10*3/uL (ref 4.0–10.5)

## 2012-06-25 LAB — IRON: Iron: 71 ug/dL (ref 42–145)

## 2012-06-25 LAB — VITAMIN B12: Vitamin B-12: 317 pg/mL (ref 211–911)

## 2012-06-25 LAB — LIPID PANEL
LDL Cholesterol: 79 mg/dL (ref 0–99)
VLDL: 9 mg/dL (ref 0–40)

## 2012-06-25 MED ORDER — METFORMIN HCL 1000 MG PO TABS: 1000.0000 mg | ORAL_TABLET | Freq: Two times a day (BID) | ORAL | Status: DC

## 2012-06-25 NOTE — Patient Instructions (Signed)
Keep up a regular exercise program and make sure you are eating a healthy diet Try to eat 4 servings of dairy a day, or if you are lactose intolerant take a calcium with vitamin D daily.  Your vaccines are up to date.   

## 2012-06-25 NOTE — Progress Notes (Signed)
  Subjective:     Carly [name] is a 42 y.o. female and is here for a comprehensive physical exam. The patient reports problems - has been feeling a littlemore fatigue.  hx of iron def anemia and vit D def and b12 def.  Used to gett b12 shots bc didn't respond well to oral supplement.  .  History   Social History  . Marital Status: Married    Spouse Name: Molly Maduro    Number of Children: 3  . Years of Education: N/A   Occupational History  . 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    Social History Main Topics  . Smoking status: Never Smoker   . Smokeless tobacco: Never Used  . Alcohol Use: No  . Drug Use: No  . Sexually Active: Yes -- Female partner(s)     Comment: adopted   Other Topics Concern  . Not on file   Social History Narrative   Caffeine daily. No regular exercise.  She was adopted.    Health Maintenance  Topic Date Due  . Pap Smear  01/23/2011  . Mammogram  06/26/2012  . Influenza Vaccine  09/22/2012  . Colonoscopy  01/22/2014  . Tetanus/tdap  01/23/2019    The following portions of the patient's history were reviewed and updated as appropriate: allergies, current medications, past family history, past medical history, past social history, past surgical history and problem list.  Review of Systems A comprehensive review of systems was negative.   Objective:    BP 113/72  Pulse 81  Ht 5' 0.25" (1.53 m)  Wt 136 lb (61.689 kg)  BMI 26.35 kg/m2  LMP 05/25/2012 General appearance: alert, cooperative and appears stated age Head: Normocephalic, without obvious abnormality, atraumatic Eyes: conj clear, EOMI, PEERLA Ears: normal TM's and external ear canals both ears Nose: Nares normal. Septum midline. Mucosa normal. No drainage or sinus tenderness. Throat: lips, mucosa, and tongue normal; teeth and gums normal Neck: no adenopathy, no carotid bruit, no JVD, supple, symmetrical, trachea midline and thyroid not enlarged, symmetric, no  tenderness/mass/nodules Back: symmetric, no curvature. ROM normal. No CVA tenderness. Lungs: clear to auscultation bilaterally Breasts: normal appearance, no masses or tenderness Heart: regular rate and rhythm, S1, S2 normal, no murmur, click, rub or gallop Abdomen: soft, non-tender; bowel sounds normal; no masses,  no organomegaly Pelvic: cervix normal in appearance, external genitalia normal, no adnexal masses or tenderness, no cervical motion tenderness, rectovaginal septum normal, uterus normal size, shape, and consistency and vagina normal without discharge Extremities: extremities normal, atraumatic, no cyanosis or edema Pulses: 2+ and symmetric Skin: Skin color, texture, turgor normal. No rashes or lesions Lymph nodes: Cervical, supraclavicular, and axillary nodes normal. Neurologic: Grossly normal    Assessment:    Healthy female exam.      Plan:     See After Visit Summary for Counseling Recommendations  Keep up a regular exercise program and make sure you are eating a healthy diet Try to eat 4 servings of dairy a day, or if you are lactose intolerant take a calcium with vitamin D daily.  Your vaccines are up to date.  Check CMP and lipids.   Hx of irond def - recheck iron and ferritin. Doesn't take her iron consistantly  Vit D def - recommend maintenance dose of [phone] IU daily  B12 def- will check levels today. Check CBC.   Hypothyroidism-due to recheck TSH today. She's taking medication. She denies any recent skin hair or weight changes.

## 2012-06-26 ENCOUNTER — Other Ambulatory Visit: Payer: Self-pay | Admitting: Family Medicine

## 2012-06-26 LAB — VITAMIN D 25 HYDROXY (VIT D DEFICIENCY, FRACTURES): Vit D, 25-Hydroxy: 19 ng/mL — ABNORMAL LOW (ref 30–89)

## 2012-06-26 MED ORDER — LEVOTHYROXINE SODIUM 125 MCG PO TABS: 125.0000 ug | ORAL_TABLET | Freq: Every day | ORAL | Status: DC

## 2012-07-02 ENCOUNTER — Encounter: Payer: Self-pay | Admitting: Family Medicine

## 2012-07-16 ENCOUNTER — Encounter: Payer: Self-pay | Admitting: Physician Assistant

## 2012-07-16 ENCOUNTER — Ambulatory Visit (INDEPENDENT_AMBULATORY_CARE_PROVIDER_SITE_OTHER): Payer: Managed Care, Other (non HMO)

## 2012-07-16 ENCOUNTER — Ambulatory Visit (INDEPENDENT_AMBULATORY_CARE_PROVIDER_SITE_OTHER): Payer: Managed Care, Other (non HMO) | Admitting: Physician Assistant

## 2012-07-16 VITALS — BP 135/87 | HR 89 | Wt 138.0 lb

## 2012-07-16 DIAGNOSIS — M84374A Stress fracture, right foot, initial encounter for fracture: Secondary | ICD-10-CM

## 2012-07-16 DIAGNOSIS — M79671 Pain in right foot: Secondary | ICD-10-CM

## 2012-07-16 DIAGNOSIS — M79609 Pain in unspecified limb: Secondary | ICD-10-CM

## 2012-07-16 DIAGNOSIS — M84376A Stress fracture, unspecified foot, initial encounter for fracture: Secondary | ICD-10-CM

## 2012-07-16 MED ORDER — MELOXICAM 15 MG PO TABS: 15.0000 mg | ORAL_TABLET | Freq: Every day | ORAL | Status: DC

## 2012-07-16 NOTE — Progress Notes (Signed)
  Subjective:    Patient ID: Georgeanne Nim, female    DOB: 1970-12-30, 42 y.o.   MRN: [id number]  HPI Patient is a 42 yo female who presents to the clinic with right foot pain. She had started exercising with her husband. She is very competitive and one day walked 8 miles. The next day her calf muscle was so tender. She stopped exercising but noticed her right foot was very painful. For the last 2 weeks pt has not exercised. Right foot is throbbing 7/10. Worse with weight bearing. She has also noticed some bruising and knot on the top of her foot. She has taken some ibuprofen but does not help very much.     Review of Systems     Objective:   Physical Exam  Musculoskeletal:  Right foot- Decreased ROM due to pain. Strength 3/5. Bruising over dorsum of right foot. Mobile nodule over top of foot over metatarsal. Pain with palpation over middle foot metatarsal at top and bottom of foot.           Assessment & Plan:  Right foot pain- I suspect a stress fracture over metatarsals. I will get xray of right foot though there is a chance will not show stress fracture. Went ahead and put in post op boot for next 4 weeks. Discussed compression with ace bandage also. Ice frequently. Gave exercises to start daily. Gave mobic for inflammation. Follow up if not improving. Follow up in 4 weeks.

## 2012-07-16 NOTE — Patient Instructions (Signed)
Metatarsal Stress Fracture A stress fracture is a break in a bone of the body that is caused by repeated stress (trauma) that slowly weakens the bone until it eventually breaks. The metatarsal bones are in the middle of the feet, connecting the toes to the ankle. They are vulnerable to stress fractures. Metatarsal stress fractures are the second most common type of stress fracture in athletes. The metatarsal of the pointer toe (second metatarsal) is the most common metatarsal to suffer a stress fracture. SYMPTOMS   Vague, spread out pain or ache. Sometimes, tenderness and swelling in the foot.  Uncommonly, bleeding and bruising in the foot.  Weakness and inability to bear weight on the injured foot.  Paleness and deformity (sometimes). CAUSES  A stress fracture is caused by repeated trauma. This slowly weakens the bone, faster than it can heal itself, until the bone breaks. Stress fractures often follow a sudden change in training schedule. Stress fractures may be related to the loss of menstrual period in women.  RISK INCREASES WITH:   Previous stress fracture.  Sudden changes in training intensity, frequency, or duration Geophysical data processor recruits, distance runners).  Bony abnormalities (osteoporosis, tumors).  Metabolism disorders or hormone problems.  Nutrition deficiencies or eating disorders (anorexia or bulimia).  The loss of or irregular menstrual periods in women.  Poor strength and flexibility.  Running on hard surfaces. Poor leg and foot alignment. This includes flat feet.  Poor footwear with poor shock absorbers.  Poor running technique. PREVENTION   Warm up and stretch properly before activity.  Maintain physical fitness:  Muscle strength.  Endurance and flexibility.  Wear proper and correctly fitted footwear. Replace shoes after 300 to 500 miles of running.  Learn and use proper technique with training and activity.  Increase activity and training  gradually.  Treat hormonal disorders. Birth control pills can be helpful for women with menstrual period irregularity.  Correct metabolism and nutrition disorders.  Wear cushioned arch supports for runners with flat feet. PROGNOSIS  With proper treatment, stress fractures usually heal within 6 to 12 weeks. RELATED COMPLICATIONS   Failure to heal (nonunion), especially with stress fractures of the outer foot (upper part of the fifth metatarsal).  Healing in a poor position (malunion).  Recurring stress fracture.  Progression to a complete or displaced fracture.  Risks of surgery: infection, bleeding, injury to nerves (numbness, weakness, paralysis), and need for further surgery.  Repeated stress fracture, not necessarily at the same site. (Occurs in 1 of every 10 patients). TREATMENT Treatment first involves ice and medicine to reduce pain and inflammation. You must rest from any aggravating activity, to avoid making the fracture worse. For severe stress fractures, crutches may be advised, to take weight off the injured foot. Depending on your caregiver's instructions, you may be permitted to perform activities that do not cause pain. Any menstrual, hormonal, or nutritional problems must be addressed and treated. Return to activity must be performed gradually, to avoid reinjuring the foot. Physical therapy may be advised, to help strengthen the foot and regain full function. On rare occasions, surgery is needed. This may be offered if non-surgical treatment is ineffective after 3 to 6 months. MEDICATION   If pain medicine is needed, nonsteroidal anti-inflammatory medicines (NSAIDS) or other minor pain relievers are often advised.  Do not take pain medicine for 7 days before surgery.  Only take over-the-counter or prescription medicines for pain, discomfort, or fever as directed by your caregiver. SEEK IMMEDIATE MEDICAL CARE IF:  Symptoms get  worse or do not improve in 2 weeks, despite  treatment. The following occur after immobilization or surgery:  Swelling above or below the fracture site.  Severe, persistent pain.  Blue or gray skin below the fracture site, especially under the toenails. Numbness or loss of feeling below the fracture site.  New, unexplained symptoms develop. (Drugs used in treatment may produce side effects.) Document Released: 01/08/2005 Document Revised: 04/02/2011 Document Reviewed: 04/22/2008 [facility] Patient Information 2014 [place], [place].   Metatarsal Fracture  with Rehab A metatarsal fracture is a break (fracture) of one of the bones of the mid-foot (metatarsal bones). The metatarsal bones are responsible for maintaining the arch of the foot. There are three classifications of metatarsal fractures: dancer's fractures, Jones fractures, and stress fractures. A dancer's fracture is when a piece of bone is pulled off by a ligament or tendon (avulsion fracture) of the outer part of the foot (fifth metatarsal), near the joint with the ankle bones. A Jones fracture occurs in the middle of the fifth metatarsal. These fractures have limited ability to heal. A stress fracture occurs when the bone is slowly injured faster than it can repair itself. SYMPTOMS   Sharp pain, especially with standing or walking.  Tenderness, swelling, and later bruising (contusion) of the foot.  Numbness or paralysis from swelling in the foot, causing pressure on the blood vessels or nerves (uncommon). CAUSES  Fractures occur when a force is placed on the bone that is greater than it can handle. Common causes of injury include:  Direct hit (trauma) to the foot.  Twisting injury to the foot or ankle.  Landing on the foot and ankle in an improper position. RISK INCRESES WITH:  Participation in contact sports, sports that require jumping and landing, or sports in which cleats are worn and sliding occurs.  Previous foot or ankle sprains or dislocations.  Repeated  injury to any joint in the foot.  Poor strength and flexibility. PREVENTION  Warm up and stretch properly before an activity.  Allow for adequate recovery between workouts.  Maintain physical fitness in:  Strength, flexibility, and endurance.  Cardiovascular fitness.  When participating in jumping or contact sports, protect joints with supportive devices, such as wrapped elastic bandages, tape, braces, or high-top athletic shoes.  Wear properly fitted and padded protective equipment. PROGNOSIS If treated properly, metatarsal fractures usually heal well. Jones fractures have a higher risk of the bone failing to heal (nonunion). Sometimes, surgery is needed to heal Jones fractures.  RELATED COMPLICATIONS   Nonunion.  Fracture heals in a poor position (malunion).  Long-term (chronic) pain, stiffness, or swelling of the foot.  Excessive bleeding in the foot or at the dislocation site, causing pressure and injury to nerves and blood vessels (rare).  Unstable or arthritic joint following repeated injury or delayed treatment. TREATMENT  Treatment first involves the use of ice and medicine, to reduce pain and inflammation. If the bone fragments are out of alignment (displaced), then immediate realigning of the bones (reduction) is required. Fractures that cannot be realigned by hand, or where the bones protrude through the skin (open), may require surgery to hold the fracture in place with screws, pins, and plates. After the bones are in proper alignment, the foot and ankle must be restrained for 6 or more weeks. Restraint allows healing to occur. After restraint, it is important to perform strengthening and stretching exercises to help regain strength and a full range of motion. These exercises may be completed at home or with  a therapist. A stiff-soled shoe and arch support (orthotic) may be required when first returning to sports. MEDICATION   If pain medicine is needed, nonsteroidal  anti-inflammatory medicines (NSAIDS), or other minor pain relievers, are often advised.  Do not take pain medicine for 7 days before surgery.  Only take over-the-counter or prescription medicines for pain, fever, or discomfort as directed by your caregiver. COLD THERAPY  Cold treatment (icing) should be applied for 10 to 15 minutes every 2 to 3 hours for inflammations and pain, and immediately after activity that aggravates your symptoms. Use ice packs or ice massage. SEEK MEDICAL CARE IF:  Pain, tenderness, or swelling gets worse, despite treatment.  You experience pain, numbness, or coldness in the foot.  Blue, gray, or dark color appears in the toenails.  You or your child has an oral temperature above 102 F (38.9 C).  You have increased pain, swelling, and redness.  You have drainage of fluids or bleeding in the affected area.  New, unexplained symptoms develop. (Drugs used in treatment may produce side effects.) EXERCISES RANGE OF MOTION (ROM) AND STRETCHING EXERCISES - Metatarsal Fracture (including Jones and Dancer's Fractures) These exercises may help you when beginning to rehabilitate your injury. Your symptoms may resolve with or without further involvement from your physician, physical therapist, or athletic trainer. While completing these exercises, remember:   Restoring tissue flexibility helps normal motion to return to the joints. This allows healthier, less painful movement and activity.  An effective stretch should be held for at least 30 seconds. A stretch should never be painful. You should only feel a gentle lengthening or release in the stretched. RANGE OF MOTION - Dorsi/Plantar Flexion  While sitting with your right / left knee straight, draw the top of your foot upwards, by flexing your ankle. Then reverse the motion, pointing your toes downward.  Hold each position for __________ seconds.  After completing your first set of exercises, repeat this exercise  with your knee bent. Repeat __________ times. Complete this exercise __________ times per day.  RANGE OF MOTION - Ankle Alphabet  Imagine your right / left big toe is a pen.  Keeping your hip and knee still, write out the entire alphabet with your "pen." Make the letters as large as you can, without increasing any discomfort. Repeat __________ times. Complete this exercise __________ times per day.  STRETCH  Gastroc, Standing  Place your hands on a wall.  Extend your right / left leg behind you, keeping the front knee somewhat bent.  Slightly point your toes inward on your back foot.  Keeping your right / left heel on the floor and your knee straight, shift your weight toward the wall, not allowing your back to arch.  You should feel a gentle stretch in the right / left calf. Hold this position for __________ seconds. Repeat __________ times. Complete this stretch __________ times per day. STRETCH  Soleus, Standing   Place your hands on a wall.  Extend your right / left leg behind you, keeping the other knee somewhat bent.  Slightly point your toes inward on your back foot.  Keep your right / left heel on the floor, bend your back knee, and slightly shift your weight over the back leg so that you feel a gentle stretch deep in your back calf.  Hold this position for __________ seconds. Repeat __________ times. Complete this stretch __________ times per day. STRENGTHENING EXERCISES - Metatarsal Fracture (Including Jones and Dancer's Fractures) These exercises may  help you when beginning to rehabilitate your injury. They may resolve your symptoms with or without further involvement from your physician, physical therapist, or athletic trainer. While completing these exercises, remember:   Muscles can gain both the endurance and the strength needed for everyday activities through controlled exercises.  Complete these exercises as instructed by your physician, physical therapist or  athletic trainer. Increase the resistance and repetitions only as guided by your caregiver. STRENGTH - Dorsiflexors  Secure a rubber exercise band or tubing to a fixed object (table, pole) and loop the other end around your right / left foot.  Sit on the floor facing the fixed object. The band should be slightly tense when your foot is relaxed.  Slowly draw your foot back toward you, using your ankle and toes.  Hold this position for __________ seconds. Slowly release the tension in the band and return your foot to the starting position. Repeat __________ times. Complete this exercise __________ times per day.  STRENGTH - Plantar-flexors   Sit with your right / left leg extended. Holding onto both ends of a rubber exercise band or tubing, loop it around the ball of your foot. Keep a slight tension in the band.  Slowly push your toes away from you, pointing them downward.  Hold this position for __________ seconds. Return slowly, controlling the tension in the band. Repeat __________ times. Complete this exercise __________ times per day.  STRENGTH - Plantar-flexors  Stand with your feet shoulder width apart. Steady yourself with a wall or table, using as little support as needed.  Keeping your weight evenly spread over the width of your feet, rise up on your toes.*  Hold this position for __________ seconds. Repeat __________ times. Complete this exercise __________ times per day.  *If this is too easy, shift your weight toward your right / left leg until you feel challenged. Ultimately, you may be asked to do this exercise while standing on your right / left foot only. STRENGTH - Towel Curls  Sit in a chair, on a non-carpeted surface.  Place your foot on a towel, keeping your heel on the floor.  Pull the towel toward your heel only by curling your toes. Keep your heel on the floor.  If instructed by your physician, physical therapist, or athletic trainer, weight may be added at  the end of the towel. Repeat __________ times. Complete this exercise __________ times per day. STRENGTH - Ankle Eversion  Secure one end of a rubber exercise band or tubing to a fixed object (table, pole). Loop the other end around your foot, just before your toes.  Place your fists between your knees. This will focus your strengthening at your ankle.  Drawing the band across your opposite foot, away from the pole, slowly, pull your little toe out and up. Make sure the band is positioned to resist the entire motion.  Hold this position for __________ seconds.  Have your muscles resist the band, as it slowly pulls your foot back to the starting position. Repeat __________ times. Complete this exercise __________ times per day.  STRENGTH - Ankle Inversion  Secure one end of a rubber exercise band or tubing to a fixed object (table, pole). Loop the other end around your foot, just before your toes.  Place your fists between your knees. This will focus your strengthening at your ankle.  Slowly, pull your big toe up and in, making sure the band is positioned to resist the entire motion.  Hold this  position for __________ seconds.  Have your muscles resist the band, as it slowly pulls your foot back to the starting position. Repeat __________ times. Complete this exercises __________ times per day.  Document Released: 01/08/2005 Document Revised: 04/02/2011 Document Reviewed: 04/22/2008 [facility] Patient Information 2014 [place], [place].

## 2012-07-18 ENCOUNTER — Telehealth: Payer: Self-pay | Admitting: *Deleted

## 2012-07-18 MED ORDER — TRAMADOL HCL 50 MG PO TABS: 50.0000 mg | ORAL_TABLET | Freq: Four times a day (QID) | ORAL | Status: DC | PRN

## 2012-07-18 NOTE — Telephone Encounter (Signed)
Has stress fracture in right foot and given Mobic and pt states this is not helping- foot is noticebly swollen, wearing boot and ace wrap and the pain is horrible and wants to know if you can give her the pain med now.  Uses [facility] [place] [facility]

## 2012-07-18 NOTE — Telephone Encounter (Signed)
I would continue to use mobic unless she feels like mobic made worse. I sent tramadol to use for pain.

## 2012-07-18 NOTE — Telephone Encounter (Signed)
LMOM that med sent to her pharmacy. Barry Dienes, LPN

## 2012-08-05 ENCOUNTER — Ambulatory Visit (INDEPENDENT_AMBULATORY_CARE_PROVIDER_SITE_OTHER): Payer: Managed Care, Other (non HMO) | Admitting: Family Medicine

## 2012-08-05 ENCOUNTER — Encounter: Payer: Self-pay | Admitting: Family Medicine

## 2012-08-05 VITALS — BP 137/86 | HR 78 | Wt 138.0 lb

## 2012-08-05 DIAGNOSIS — M84374S Stress fracture, right foot, sequela: Secondary | ICD-10-CM

## 2012-08-05 DIAGNOSIS — K219 Gastro-esophageal reflux disease without esophagitis: Secondary | ICD-10-CM

## 2012-08-05 DIAGNOSIS — T148XXA Other injury of unspecified body region, initial encounter: Secondary | ICD-10-CM

## 2012-08-05 DIAGNOSIS — S8290XS Unspecified fracture of unspecified lower leg, sequela: Secondary | ICD-10-CM

## 2012-08-05 MED ORDER — ESOMEPRAZOLE MAGNESIUM 40 MG PO PACK: 40.0000 mg | PACK | Freq: Every day | ORAL | Status: DC

## 2012-08-05 MED ORDER — TRAMADOL HCL 50 MG PO TABS: 50.0000 mg | ORAL_TABLET | Freq: Four times a day (QID) | ORAL | Status: DC | PRN

## 2012-08-05 MED ORDER — CYCLOBENZAPRINE HCL 10 MG PO TABS: ORAL_TABLET | ORAL | Status: DC

## 2012-08-05 NOTE — Progress Notes (Signed)
CC: Carly [name] is a 42 y.o. female is here for mid upper back pain and Heartburn   Subjective: HPI:  Patient complains of epigastric pain described as a burning moderate severity nonradiating present all hours of the day seems to be worse after eating. This has been present for approximately 3-4 weeks. Slowly worsening on a weekly basis. Slightly improved with ranitidine twice a day. Nothing else makes better or worse. She doesn't believe she's ever had this before she denies exertional pain, diarrhea, constipation, nausea, vomiting, diaphoresis, shortness of breath, chest pain nor pelvic pain.  Patient complains of back pain localized just below the left scapula described as a spasm/pull moderate severity came on somewhat abruptly after lifting heavy books at [employer] Saturday. Slowly improving since onset improves with meloxicam and Ultram. Improves with resting worse with coughing or deep breathing. She's never had this before. She denies persistent cough, wheezing, shortness of breath, crepitus, flank pain.  Patient reports that she stopped wearing the immobilizer for right foot. Pain is approximately 75% improved since being seen 4 weeks ago. Slightly worsened with standing as it with nonweightbearing. She ran out of meloxicam yesterday. She denies new swelling, redness, warmth, nor new pain in the right lower extremity  Review Of Systems Outlined In HPI  Past Medical History  Diagnosis Date  . Thyroid disease   . PCOS (polycystic ovarian syndrome)   . Arthritis   . TIA (transient ischemic attack)   . Hypertension      Family History  Problem Relation Age of Onset  . Melanoma Mother   . Depression Mother   . Hyperlipidemia Mother   . Colon cancer Maternal Grandmother   . Colon cancer Father 25    deceased.      History  Substance Use Topics  . Smoking status: Never Smoker   . Smokeless tobacco: Never Used  . Alcohol Use: No     Objective: Filed Vitals:   08/05/12 1526  BP: 137/86  Pulse: 78    General: Alert and Oriented, No Acute Distress HEENT: Pupils equal, round, reactive to light. Conjunctivae clear.  Moist mucous membranes pharynx unremarkable Lungs: Clear to auscultation bilaterally, no wheezing/ronchi/rales.  Comfortable work of breathing. Good air movement. Cardiac: Regular rate and rhythm. Normal S1/S2.  No murmurs, rubs, nor gallops.   Abdomen: Soft nontender Back: No midline spinous process tenderness in the thoracic region, pain is reproduced with palpation of left rhomboid, no pain with palpation of left scapula. Full range of motion and strength in the bilateral upper extremities. No crepitus over site of pain in the back. Extremities: No peripheral edema.  Strong peripheral pulses.  Mental Status: No depression, anxiety, nor agitation. Skin: Warm and dry.  Assessment & Plan: Hallie was seen today for mid upper back pain and heartburn.  Diagnoses and associated orders for this visit:  Muscle strain - cyclobenzaprine (FLEXERIL) 10 MG tablet; Take a half to a full tab every 8-12 hours only as needed for muscle spasm, may cause sedation. - traMADol (ULTRAM) 50 MG tablet; Take 1 tablet (50 mg total) by mouth every 6 (six) hours as needed for pain.  GERD (gastroesophageal reflux disease) - esomeprazole (NEXIUM) 40 MG packet; Take 40 mg by mouth daily before breakfast.  Metatarsal stress fracture, right, sequela  Other Orders - ranitidine (ZANTAC) 150 MG capsule; Take 150 mg by mouth 2 (two) times daily.    Discussed with patient the back pain is likely due to muscle strain I would like  her to avoid nonsteroidal anti-inflammatories if at all possible given worsening gastritis/GERD focus on tramadol and/or cyclobenzaprine, try to stay mobile use heat as tolerated. Anticipate self resolution one week. GERD/gastritis: Discussed my high suspicion for nonsteroidal anti-inflammatory induced gastritis/GERD I would like her to  continue on ranitidine until this weekend start Nexium daily starting now. I believe that she avoids nonsteroidal anti-inflammatory she will only need this for at most 2 months. Return 1 week if lack of improvement Metatarsal stress fracture: Improved, Encouraged patient to gradually reintroduce weightbearing activities   25 minutes spent face-to-face during visit today of which at least 50% was counseling or coordinating care regarding GERD, metatarsal stress fracture, muscle strain.   Return if symptoms worsen or fail to improve.

## 2012-08-31 ENCOUNTER — Other Ambulatory Visit: Payer: Self-pay | Admitting: Family Medicine

## 2012-10-01 ENCOUNTER — Other Ambulatory Visit: Payer: Self-pay | Admitting: Family Medicine

## 2013-01-02 ENCOUNTER — Other Ambulatory Visit: Payer: Self-pay | Admitting: Family Medicine

## 2013-02-03 ENCOUNTER — Other Ambulatory Visit: Payer: Self-pay | Admitting: Family Medicine

## 2013-03-05 ENCOUNTER — Encounter: Payer: Self-pay | Admitting: Sports Medicine

## 2013-03-05 ENCOUNTER — Ambulatory Visit (INDEPENDENT_AMBULATORY_CARE_PROVIDER_SITE_OTHER): Payer: Managed Care, Other (non HMO) | Admitting: Sports Medicine

## 2013-03-05 ENCOUNTER — Other Ambulatory Visit: Payer: Self-pay

## 2013-03-05 VITALS — BP 149/93 | HR 75 | Ht 61.0 in | Wt 140.0 lb

## 2013-03-05 DIAGNOSIS — M549 Dorsalgia, unspecified: Secondary | ICD-10-CM

## 2013-03-05 DIAGNOSIS — M130 Polyarthritis, unspecified: Secondary | ICD-10-CM

## 2013-03-05 MED ORDER — ETODOLAC ER 500 MG PO TB24
500.0000 mg | ORAL_TABLET | Freq: Every day | ORAL | Status: DC
Start: 2013-03-05 — End: 2013-04-02

## 2013-03-05 NOTE — Assessment & Plan Note (Signed)
Has seen a rheumatologist in the past for this. His right nearly every NSAID. She had an elevated ESR but never had any positive rheumatoid tests. She likely represents the gray area between normal and autoimmune disease. Lodine at bedtime. Left third metacarpophalangeal and proximal interphalangeal joint injections as above. Return in one month.

## 2013-03-05 NOTE — Assessment & Plan Note (Addendum)
Patient she does get a good response, approximately 2 months to sacroiliac joint injections performed by trauma surgeon at [facility]. I would like her to see Dr. Mina Marble at [facility] for consideration of further SI joint injections, and likely future radiofrequency ablation. I am going to order an MRI of her lumbar spine. She needs this done at [facility]. I'm happy to perform further sacroiliac joint injections here in the office if needed.

## 2013-03-05 NOTE — Progress Notes (Signed)
   Subjective:    I'm seeing this patient as a consultation for: Dr. Madilyn Fireman   CC: Left hand pain  HPI: This is a very pleasant 43 year old female, she has seen rheumatology and orthopedics in the past, unfortunately she continues to have pain she localizes the metacarpophalangeal joints and proximal interphalangeal joints bilaterally, left worse than right, worse at the left third proximal interphalangeal and metacarpophalangeal joints. She was initially treated with Plaquenil, and has been through essentially every NSAIDs known to man. Now she continues to experience pain and morning stiffness, in all her fingers, as well as knees and shoulders. Symptoms are moderate, persistent  Low back pain: Localized to the right SI joint, she tells me that she has been having a trauma surgeon at [facility] for unguided sacroiliac joint injections which last approximately 3 months at a time. She has never pursued radiofrequency ablation.  Past medical history, Surgical history, Family history not pertinant except as noted below, Social history, Allergies, and medications have been entered into the medical record, reviewed, and no changes needed.   Review of Systems: No headache, visual changes, nausea, vomiting, diarrhea, constipation, dizziness, abdominal pain, skin rash, fevers, chills, night sweats, weight loss, swollen lymph nodes, body aches, joint swelling, muscle aches, chest pain, shortness of breath, mood changes, visual or auditory hallucinations.   Objective:   General: Well Developed, well nourished, and in no acute distress.  Neuro/Psych: Alert and oriented x3, extra-ocular muscles intact, able to move all 4 extremities, sensation grossly intact. Skin: Warm and dry, no rashes noted.  Respiratory: Not using accessory muscles, speaking in full sentences, trachea midline.  Cardiovascular: Pulses palpable, no extremity edema. Abdomen: Does not appear distended. Left hand: Tender to palpation with  palpable synovitis of the left third metacarpophalangeal joint and proximal interphalangeal joint.  Procedure: Real-time Ultrasound Guided Injection of left third metacarpophalangeal joint Device: GE Logiq E  Verbal informed consent obtained.  Time-out conducted.  Noted no overlying erythema, induration, or other signs of local infection.  Skin prepped in a sterile fashion.  Local anesthesia: Topical Ethyl chloride.  With sterile technique and under real time ultrasound guidance:  25-gauge needle advanced into the MCP, 0.5 cc Kenalog 40, 0.5 cc lidocaine injected easily. Completed without difficulty  Pain immediately resolved suggesting accurate placement of the medication.  Advised to call if fevers/chills, erythema, induration, drainage, or persistent bleeding.  Images permanently stored and available for review in the ultrasound unit.  Impression: Technically successful ultrasound guided injection.  Procedure: Real-time Ultrasound Guided Injection of left third proximal interphalangeal joint Device: GE Logiq E  Verbal informed consent obtained.  Time-out conducted.  Noted no overlying erythema, induration, or other signs of local infection.  Skin prepped in a sterile fashion.  Local anesthesia: Topical Ethyl chloride.  With sterile technique and under real time ultrasound guidance:  25-gauge needle advanced in the joint, 0.5 cc kilo Precose or 0.5 cc lidocaine injected easily. Completed without difficulty  Pain immediately resolved suggesting accurate placement of the medication.  Advised to call if fevers/chills, erythema, induration, drainage, or persistent bleeding.  Images permanently stored and available for review in the ultrasound unit.  Impression: Technically successful ultrasound guided injection.  Impression and Recommendations:   This case required medical decision making of moderate complexity.

## 2013-03-06 ENCOUNTER — Telehealth: Payer: Self-pay | Admitting: *Deleted

## 2013-03-06 NOTE — Telephone Encounter (Signed)
Prior auth for MRI Lumbar Spine w/o contrast approved through [facility].  Approval # [id number] good until 06/04/13.  Notified referral coordinator of approval and to schedule at [facility]. Clemetine Marker, LPN

## 2013-03-13 ENCOUNTER — Other Ambulatory Visit: Payer: Self-pay

## 2013-03-13 ENCOUNTER — Telehealth: Payer: Self-pay | Admitting: *Deleted

## 2013-03-13 MED ORDER — TRAMADOL HCL 50 MG PO TABS: ORAL_TABLET | ORAL | Status: DC

## 2013-03-13 NOTE — Telephone Encounter (Signed)
LMOM to return call for results. Clemetine Marker, LPN

## 2013-03-13 NOTE — Telephone Encounter (Signed)
Pt calls and states that you seen her for  Her hand pain and was given NSAID and his taking Ibuprofen 800mg  as well and now her back is hurting and scheduled for MRI on Tuesday.  She wants to know if if she can increase the NSAID dose or if not can you give her something different to help with the pain. Clemetine Marker, LPN

## 2013-03-13 NOTE — Telephone Encounter (Signed)
Pt notified med sent to pharmacy and MD instructions. Clemetine Marker, LPN

## 2013-03-13 NOTE — Telephone Encounter (Signed)
I can add tramadol, NSAID dose cannot be increased, and she also should not be taking ibuprofen as well as the Lodine. Rx in box.

## 2013-03-26 ENCOUNTER — Encounter: Payer: Self-pay | Admitting: Sports Medicine

## 2013-04-02 ENCOUNTER — Encounter: Payer: Self-pay | Admitting: Sports Medicine

## 2013-04-02 ENCOUNTER — Ambulatory Visit (INDEPENDENT_AMBULATORY_CARE_PROVIDER_SITE_OTHER): Payer: PRIVATE HEALTH INSURANCE | Admitting: Sports Medicine

## 2013-04-02 VITALS — BP 147/91 | HR 68 | Ht 61.0 in | Wt 132.0 lb

## 2013-04-02 DIAGNOSIS — M533 Sacrococcygeal disorders, not elsewhere classified: Secondary | ICD-10-CM

## 2013-04-02 DIAGNOSIS — M47817 Spondylosis without myelopathy or radiculopathy, lumbosacral region: Secondary | ICD-10-CM

## 2013-04-02 DIAGNOSIS — M47816 Spondylosis without myelopathy or radiculopathy, lumbar region: Secondary | ICD-10-CM | POA: Insufficient documentation

## 2013-04-02 DIAGNOSIS — M549 Dorsalgia, unspecified: Secondary | ICD-10-CM

## 2013-04-02 DIAGNOSIS — G8929 Other chronic pain: Secondary | ICD-10-CM

## 2013-04-02 MED ORDER — ETODOLAC ER 600 MG PO TB24: 600.0000 mg | ORAL_TABLET | Freq: Every day | ORAL | Status: DC

## 2013-04-02 NOTE — Progress Notes (Signed)
Subjective:    CC: Followup  HPI: Hand pain: Greatly improved but not completely resolved after left third PIP and MCP joint injections at the last visit. Lodine has been particularly helpful. She needs a refill.  Low back pain: Recently had her MRI, the results of which will be dictated below. She does have persistent pain in the right SI joint as well as the left SI joint. She does desire injections. She has not yet been to [facility]. Pain is moderate, persistent, she does tell me that the right-sided pain is localized, and the left-sided pain radiates down her left leg in an S1 distribution. It's worse when sitting in a car.  Past medical history, Surgical history, Family history not pertinant except as noted below, Social history, Allergies, and medications have been entered into the medical record, reviewed, and no changes needed.   Review of Systems: No fevers, chills, night sweats, weight loss, chest pain, or shortness of breath.   Objective:    General: Well Developed, well nourished, and in no acute distress.  Neuro: Alert and oriented x3, extra-ocular muscles intact, sensation grossly intact.  HEENT: Normocephalic, atraumatic, pupils equal round reactive to light, neck supple, no masses, no lymphadenopathy, thyroid nonpalpable.  Skin: Warm and dry, no rashes. Cardiac: Regular rate and rhythm, no murmurs rubs or gallops, no lower extremity edema.  Respiratory: Clear to auscultation bilaterally. Not using accessory muscles, speaking in full sentences. Back Exam:  Inspection: Unremarkable  Motion: Flexion 45 deg, Extension 45 deg, Side Bending to 45 deg bilaterally,  Rotation to 45 deg bilaterally  SLR laying: Negative  XSLR laying: Negative  Palpable tenderness: Bilateral SI joints. FABER: negative. Sensory change: Gross sensation intact to all lumbar and sacral dermatomes.  Reflexes: 2+ at both patellar tendons, 2+ at achilles tendons, Babinski's downgoing.    Strength at foot  Plantar-flexion: 5/5 Dorsi-flexion: 5/5 Eversion: 5/5 Inversion: 5/5  Leg strength  Quad: 5/5 Hamstring: 5/5 Hip flexor: 5/5 Hip abductors: 5/5  Gait unremarkable.  Procedure: Real-time Ultrasound Guided Injection of right SI joint Device: GE Logiq E  Verbal informed consent obtained.  Time-out conducted.  Noted no overlying erythema, induration, or other signs of local infection.  Skin prepped in a sterile fashion.  Local anesthesia: Topical Ethyl chloride.  With sterile technique and under real time ultrasound guidance:  Needle advanced into joint, 1 cc Kenalog 40, 4 cc lidocaine injected easily. Completed without difficulty  The patient reported complete resolution of her right-sided axial pain, this suggests that her right SI joint is the sole right-sided axial pain generator. Advised to call if fevers/chills, erythema, induration, drainage, or persistent bleeding.  Images permanently stored and available for review in the ultrasound unit.  Impression: Technically successful ultrasound guided injection.  Procedure: Real-time Ultrasound Guided Injection of left SI joint Device: GE Logiq E  Verbal informed consent obtained.  Time-out conducted.  Noted no overlying erythema, induration, or other signs of local infection.  Skin prepped in a sterile fashion.  Local anesthesia: Topical Ethyl chloride.  With sterile technique and under real time ultrasound guidance:  Spinal needle advanced into the left SI joint, 1 cc Kenalog 40, 4 cc lidocaine injected easily. Completed without difficulty  The patient did not report improvement in her pain suggesting that the left SI joint is not her left-sided pain generator. Advised to call if fevers/chills, erythema, induration, drainage, or persistent bleeding.  Images permanently stored and available for review in the ultrasound unit.  Impression: Technically successful ultrasound  guided injection.  Impression and  Recommendations:    I spent 40 minutes with this patient, greater than 50% was face-to-face time counseling regarding the above diagnoses.

## 2013-04-02 NOTE — Assessment & Plan Note (Addendum)
Right-sided pain has been strictly axial, SI joint injection under ultrasound guidance provided complete relief of her pain suggesting that the right SI joint is her right-sided axial pain generator

## 2013-04-02 NOTE — Assessment & Plan Note (Addendum)
We performed a left-sided SI joint injection under ultrasound guidance today, this provided no relief of her pain, not even temporarily. This makes sense considering she had radicular symptoms on the left side, because of this I do think her left-sided pain is predominantly discogenic. Next step on the left side would be an epidural. Increasing Lodine to 600 mg.

## 2013-04-06 ENCOUNTER — Telehealth: Payer: Self-pay

## 2013-04-06 MED ORDER — TRAMADOL HCL 50 MG PO TABS: ORAL_TABLET | ORAL | Status: DC

## 2013-04-06 NOTE — Telephone Encounter (Signed)
Rx has been faxed to [facility]

## 2013-04-06 NOTE — Telephone Encounter (Signed)
Rx in box. 

## 2013-04-06 NOTE — Telephone Encounter (Signed)
Patient request refill for Tramadol sent to [facility]. Rhonda Cunningham,CMA

## 2013-04-28 ENCOUNTER — Other Ambulatory Visit: Payer: Self-pay | Admitting: Family Medicine

## 2013-05-01 ENCOUNTER — Encounter: Payer: Self-pay | Admitting: Sports Medicine

## 2013-05-01 ENCOUNTER — Ambulatory Visit (INDEPENDENT_AMBULATORY_CARE_PROVIDER_SITE_OTHER): Payer: PRIVATE HEALTH INSURANCE | Admitting: Sports Medicine

## 2013-05-01 VITALS — BP 131/81 | HR 79 | Resp 16 | Wt 136.0 lb

## 2013-05-01 DIAGNOSIS — M47816 Spondylosis without myelopathy or radiculopathy, lumbar region: Secondary | ICD-10-CM

## 2013-05-01 DIAGNOSIS — M47817 Spondylosis without myelopathy or radiculopathy, lumbosacral region: Secondary | ICD-10-CM

## 2013-05-01 DIAGNOSIS — G8929 Other chronic pain: Secondary | ICD-10-CM

## 2013-05-01 DIAGNOSIS — M533 Sacrococcygeal disorders, not elsewhere classified: Secondary | ICD-10-CM

## 2013-05-01 NOTE — Progress Notes (Signed)
  Subjective:    CC: Followup  HPI: Right-sided SI joint dysfunction: Resolved after SI joint injection at the last visit.  Left-sided low back pain: No response to SI joint injection on the left side of the last visit, not even temporary, she does have degenerative disc disease, pain is worse with sitting, worse on the left side, this is likely related to lumbar degenerative disc disease. Pain is mild, very tolerable, and improved significantly with occasional Lodine, and tramadol.  Past medical history, Surgical history, Family history not pertinant except as noted below, Social history, Allergies, and medications have been entered into the medical record, reviewed, and no changes needed.   Review of Systems: No fevers, chills, night sweats, weight loss, chest pain, or shortness of breath.   Objective:    General: Well Developed, well nourished, and in no acute distress.  Neuro: Alert and oriented x3, extra-ocular muscles intact, sensation grossly intact.  HEENT: Normocephalic, atraumatic, pupils equal round reactive to light, neck supple, no masses, no lymphadenopathy, thyroid nonpalpable.  Skin: Warm and dry, no rashes. Cardiac: Regular rate and rhythm, no murmurs rubs or gallops, no lower extremity edema.  Respiratory: Clear to auscultation bilaterally. Not using accessory muscles, speaking in full sentences.  Impression and Recommendations:

## 2013-05-01 NOTE — Assessment & Plan Note (Signed)
Continued good response after right SI joint injection. May return for repeat injection as needed.

## 2013-05-01 NOTE — Assessment & Plan Note (Signed)
Still has mild-moderate pain but tolerable, and the low back, more on the left side. His worse with sitting, again lending suggestion to discogenic pain. If pain persists, and is on the left side, we would likely proceed with a left-sided L4-L5 interlaminar epidural.

## 2013-05-03 ENCOUNTER — Other Ambulatory Visit: Payer: Self-pay | Admitting: Family Medicine

## 2013-05-06 ENCOUNTER — Other Ambulatory Visit: Payer: Self-pay | Admitting: Sports Medicine

## 2013-06-02 ENCOUNTER — Other Ambulatory Visit: Payer: Self-pay | Admitting: Sports Medicine

## 2013-06-10 ENCOUNTER — Other Ambulatory Visit: Payer: Self-pay

## 2013-06-10 ENCOUNTER — Other Ambulatory Visit: Payer: Self-pay | Admitting: Sports Medicine

## 2013-06-11 ENCOUNTER — Telehealth: Payer: Self-pay | Admitting: *Deleted

## 2013-06-11 NOTE — Telephone Encounter (Signed)
It could be but with heavy persistent bleeding over a short period time, and being that she they're at age 43, I would say lets get her scheduled down the hall with GYN as she may end up needing an endometrial biopsy which I would not be able to do for her. This can be done easily in the office. In a patient who is over 83 with heavy persistent periods it is usually recommended.

## 2013-06-11 NOTE — Telephone Encounter (Signed)
Pt left message stating that she has her period 3 times in the last 6 weeks, heavy at times with clots.  She wants to know if this could be premenopausal or if she needs to come in to be seen.  Please advise.

## 2013-06-16 NOTE — Telephone Encounter (Signed)
LMOM for pt to return call. 

## 2013-06-24 ENCOUNTER — Ambulatory Visit (INDEPENDENT_AMBULATORY_CARE_PROVIDER_SITE_OTHER): Payer: PRIVATE HEALTH INSURANCE | Admitting: Physician Assistant

## 2013-06-24 ENCOUNTER — Encounter: Payer: Self-pay | Admitting: Physician Assistant

## 2013-06-24 VITALS — BP 136/80 | HR 87 | Ht 61.0 in | Wt 131.0 lb

## 2013-06-24 DIAGNOSIS — N938 Other specified abnormal uterine and vaginal bleeding: Secondary | ICD-10-CM

## 2013-06-24 DIAGNOSIS — E039 Hypothyroidism, unspecified: Secondary | ICD-10-CM

## 2013-06-24 DIAGNOSIS — R5383 Other fatigue: Secondary | ICD-10-CM

## 2013-06-24 DIAGNOSIS — R5381 Other malaise: Secondary | ICD-10-CM

## 2013-06-24 DIAGNOSIS — D509 Iron deficiency anemia, unspecified: Secondary | ICD-10-CM

## 2013-06-24 DIAGNOSIS — N946 Dysmenorrhea, unspecified: Secondary | ICD-10-CM

## 2013-06-24 DIAGNOSIS — N949 Unspecified condition associated with female genital organs and menstrual cycle: Secondary | ICD-10-CM

## 2013-06-24 DIAGNOSIS — N925 Other specified irregular menstruation: Secondary | ICD-10-CM

## 2013-06-24 NOTE — Patient Instructions (Addendum)
Will order ultrasound.  Will get labs.   Dysfunctional Uterine Bleeding Normally, menstrual periods begin between ages 44 to 57 in young women. A normal menstrual cycle/period may begin every 23 days up to 35 days and lasts from 1 to 7 days. Around 12 to 14 days before your menstrual period starts, ovulation (ovary produces an egg) occurs. When counting the time between menstrual periods, count from the first day of bleeding of the previous period to the first day of bleeding of the next period. Dysfunctional (abnormal) uterine bleeding is bleeding that is different from a normal menstrual period. Your periods may come earlier or later than usual. They may be lighter, have blood clots or be heavier. You may have bleeding between periods, or you may skip one period or more. You may have bleeding after sexual intercourse, bleeding after menopause, or no menstrual period. CAUSES   Pregnancy (normal, miscarriage, tubal).  IUDs (intrauterine device, birth control).  Birth control pills.  Hormone treatment.  Menopause.  Infection of the cervix.  Blood clotting problems.  Infection of the inside lining of the uterus.  Endometriosis, inside lining of the uterus growing in the pelvis and other female organs.  Adhesions (scar tissue) inside the uterus.  Obesity or severe weight loss.  Uterine polyps inside the uterus.  Cancer of the vagina, cervix, or uterus.  Ovarian cysts or polycystic ovary syndrome.  Medical problems (diabetes, thyroid disease).  Uterine fibroids (noncancerous tumor).  Problems with your female hormones.  Endometrial hyperplasia, very thick lining and enlarged cells inside of the uterus.  Medicines that interfere with ovulation.  Radiation to the pelvis or abdomen.  Chemotherapy. DIAGNOSIS   Your doctor will discuss the history of your menstrual periods, medicines you are taking, changes in your weight, stress in your life, and any medical problems you  may have.  Your doctor will do a physical and pelvic examination.  Your doctor may want to perform certain tests to make a diagnosis, such as:  Pap test.  Blood tests.  Cultures for infection.  CT scan.  Ultrasound.  Hysteroscopy.  Laparoscopy.  MRI.  Hysterosalpingography.  D and C.  Endometrial biopsy. TREATMENT  Treatment will depend on the cause of the dysfunctional uterine bleeding (DUB). Treatment may include:  Observing your menstrual periods for a couple of months.  Prescribing medicines for medical problems, including:  Antibiotics.  Hormones.  Birth control pills.  Removing an IUD (intrauterine device, birth control).  Surgery:  D and C (scrape and remove tissue from inside the uterus).  Laparoscopy (examine inside the abdomen with a lighted tube).  Uterine ablation (destroy lining of the uterus with electrical current, laser, heat, or freezing).  Hysteroscopy (examine cervix and uterus with a lighted tube).  Hysterectomy (remove the uterus). HOME CARE INSTRUCTIONS   If medicines were prescribed, take exactly as directed. Do not change or switch medicines without consulting your caregiver.  Long term heavy bleeding may result in iron deficiency. Your caregiver may have prescribed iron pills. They help replace the iron that your body lost from heavy bleeding. Take exactly as directed.  Do not take aspirin or medicines that contain aspirin one week before or during your menstrual period. Aspirin may make the bleeding worse.  If you need to change your sanitary pad or tampon more than once every 2 hours, stay in bed with your feet elevated and a cold pack on your lower abdomen. Rest as much as possible, until the bleeding stops or slows down.  Eat well-balanced meals. Eat foods high in iron. Examples are:  Leafy green vegetables.  Whole-grain breads and cereals.  Eggs.  Meat.  Liver.  Do not try to lose weight until the abnormal  bleeding has stopped and your blood iron level is back to normal. Do not lift more than ten pounds or do strenuous activities when you are bleeding.  For a couple of months, make note on your calendar, marking the start and ending of your period, and the type of bleeding (light, medium, heavy, spotting, clots or missed periods). This is for your caregiver to better evaluate your problem. SEEK MEDICAL CARE IF:   You develop nausea (feeling sick to your stomach) and vomiting, dizziness, or diarrhea while you are taking your medicine.  You are getting lightheaded or weak.  You have any problems that may be related to the medicine you are taking.  You develop pain with your DUB.  You want to remove your IUD.  You want to stop or change your birth control pills or hormones.  You have any type of abnormal bleeding mentioned above.  You are over 44 years old and have not had a menstrual period yet.  You are 43 years old and you are still having menstrual periods.  You have any of the symptoms mentioned above.  You develop a rash. SEEK IMMEDIATE MEDICAL CARE IF:   An oral temperature above 102 F (38.9 C) develops.  You develop chills.  You are changing your sanitary pad or tampon more than once an hour.  You develop abdominal pain.  You pass out or faint. Document Released: 01/06/2000 Document Revised: 04/02/2011 Document Reviewed: 12/07/2008 [facility] Patient Information 2014 [place].

## 2013-06-25 ENCOUNTER — Ambulatory Visit (INDEPENDENT_AMBULATORY_CARE_PROVIDER_SITE_OTHER): Payer: PRIVATE HEALTH INSURANCE

## 2013-06-25 DIAGNOSIS — N83209 Unspecified ovarian cyst, unspecified side: Secondary | ICD-10-CM

## 2013-06-25 DIAGNOSIS — Z9889 Other specified postprocedural states: Secondary | ICD-10-CM

## 2013-06-25 LAB — CBC WITH DIFFERENTIAL/PLATELET
BASOS ABS: 0.1 10*3/uL (ref 0.0–0.1)
Basophils Relative: 1 % (ref 0–1)
Eosinophils Absolute: 0.3 10*3/uL (ref 0.0–0.7)
Eosinophils Relative: 4 % (ref 0–5)
HCT: 37.7 % (ref 36.0–46.0)
Hemoglobin: 12.9 g/dL (ref 12.0–15.0)
LYMPHS PCT: 23 % (ref 12–46)
Lymphs Abs: 1.7 10*3/uL (ref 0.7–4.0)
MCH: 30.2 pg (ref 26.0–34.0)
MCHC: 34.2 g/dL (ref 30.0–36.0)
MCV: 88.3 fL (ref 78.0–100.0)
Monocytes Absolute: 0.6 10*3/uL (ref 0.1–1.0)
Monocytes Relative: 8 % (ref 3–12)
NEUTROS ABS: 4.7 10*3/uL (ref 1.7–7.7)
NEUTROS PCT: 64 % (ref 43–77)
PLATELETS: 291 10*3/uL (ref 150–400)
RBC: 4.27 MIL/uL (ref 3.87–5.11)
RDW: 13.9 % (ref 11.5–15.5)
WBC: 7.3 10*3/uL (ref 4.0–10.5)

## 2013-06-25 LAB — FOLLICLE STIMULATING HORMONE: FSH: 6.8 m[IU]/mL

## 2013-06-25 LAB — T4, FREE: FREE T4: 1.48 ng/dL (ref 0.80–1.80)

## 2013-06-25 LAB — FERRITIN: Ferritin: 34 ng/mL (ref 10–291)

## 2013-06-25 LAB — TSH: TSH: 0.306 u[IU]/mL — AB (ref 0.350–4.500)

## 2013-06-26 ENCOUNTER — Other Ambulatory Visit: Payer: Self-pay | Admitting: Physician Assistant

## 2013-06-26 ENCOUNTER — Telehealth: Payer: Self-pay

## 2013-06-26 MED ORDER — MEDROXYPROGESTERONE ACETATE 10 MG PO TABS: ORAL_TABLET | ORAL | Status: DC

## 2013-06-26 NOTE — Telephone Encounter (Signed)
Carly [name] returned call for [facility]. She reports her cyst was 4 cm in size.

## 2013-06-26 NOTE — Telephone Encounter (Signed)
Has increased by 2cm. Let's recheck with repeat ultrasound in 6-10weeks to see if continues to grow.

## 2013-06-26 NOTE — Progress Notes (Signed)
   Subjective:    Patient ID: Carly [name], female    DOB: 1971/01/10, 43 y.o.   MRN: [id number]  HPI Patient is a 43 year old female who presents to the clinic with abnormal bleeding. She reports she has been bleeding off and on but more often on for the last 7 weeks. On average she feels like she is using 5 regular tampons a day. She has a history of iron deficiency anemia and feels very weak. She also has hypothyroidism and on medication. This has not been checked in a while. She has never had regular periods. She was always controlled with birth control but now has not been on birth control since 2011 when she had a bad reaction to YAZ with extremely high blood pressure. 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 Never had fibroids.     Review of Systems  All other systems reviewed and are negative.      Objective:   Physical Exam  Constitutional: She is oriented to person, place, and time. She appears well-developed and well-nourished.  HENT:  Head: Normocephalic and atraumatic.  Cardiovascular: Normal rate, regular rhythm and normal heart sounds.   Pulmonary/Chest: Effort normal and breath sounds normal. She has no wheezes.  Abdominal: Soft. Bowel sounds are normal. She exhibits no distension and no mass. There is no tenderness. There is no rebound and no guarding.  Neurological: She is alert and oriented to person, place, and time.  Skin: Skin is dry.  Psychiatric: She has a normal mood and affect. Her behavior is normal.          Assessment & Plan:  DUB/menorrhagia/hypothyorid/IDA- will check CBC, ferritin, TSH, free T4, FSH. Will get pelvic ultrasound to rule out fibroid/increased endometrium. Once labs come back will discuss progresterone therapy to stop bleeding. Follow up in 4 weeks. If continues will send to GYN.

## 2013-06-26 NOTE — Telephone Encounter (Signed)
Pt was seen in office.

## 2013-06-30 ENCOUNTER — Other Ambulatory Visit: Payer: Self-pay | Admitting: Family Medicine

## 2013-07-01 NOTE — Telephone Encounter (Signed)
Left message for patient to return call.

## 2013-07-15 ENCOUNTER — Other Ambulatory Visit: Payer: Self-pay | Admitting: Family Medicine

## 2013-07-16 ENCOUNTER — Other Ambulatory Visit: Payer: Self-pay | Admitting: Family Medicine

## 2013-07-22 NOTE — Telephone Encounter (Signed)
Ok put in my reminder for end of july

## 2013-07-22 NOTE — Telephone Encounter (Signed)
Left message for patient to return call.

## 2013-07-22 NOTE — Telephone Encounter (Signed)
Patient advised and she agrees to the follow up ultrasound.

## 2013-07-25 ENCOUNTER — Other Ambulatory Visit: Payer: Self-pay | Admitting: Family Medicine

## 2013-07-28 ENCOUNTER — Telehealth: Payer: Self-pay

## 2013-07-28 DIAGNOSIS — M51369 Other intervertebral disc degeneration, lumbar region without mention of lumbar back pain or lower extremity pain: Secondary | ICD-10-CM

## 2013-07-28 DIAGNOSIS — M5136 Other intervertebral disc degeneration, lumbar region: Secondary | ICD-10-CM

## 2013-07-28 NOTE — Telephone Encounter (Signed)
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 Let me know if she wants me to order this.

## 2013-07-28 NOTE — Telephone Encounter (Signed)
Patient called stated that she is taking Tramadol and it is not helping her she request a Rx for a stronger medications. Keelia Graybill,CMA

## 2013-07-29 NOTE — Telephone Encounter (Signed)
Left detailed message on patient mvm asking her to call the office back and let us know if she wants to pursue with epidural injection. Benjamen Koelling,CMA

## 2013-07-29 NOTE — Telephone Encounter (Signed)
Called patient left message on her vm to call me back regarding epidural injection. Freeman Borba,CMA

## 2013-07-30 NOTE — Telephone Encounter (Signed)
Danyell from GI has been informed to call and  schedule patient for injection. Estelle Greenleaf,CMA

## 2013-07-30 NOTE — Telephone Encounter (Signed)
Spoke to patient she stated that she wants to go ahead and have the epidural injection done. She wants to know if it would be possible for her to get it done @ [facility]. If not she will go to whoever has a first appt available. Kehaulani Fruin,CMA

## 2013-07-30 NOTE — Telephone Encounter (Signed)
[facility] will be the first available, and will likely be today or tomo.  I'll order it.

## 2013-08-03 ENCOUNTER — Ambulatory Visit (INDEPENDENT_AMBULATORY_CARE_PROVIDER_SITE_OTHER): Payer: PRIVATE HEALTH INSURANCE | Admitting: Sports Medicine

## 2013-08-03 ENCOUNTER — Encounter: Payer: Self-pay | Admitting: Sports Medicine

## 2013-08-03 VITALS — BP 140/90 | HR 67 | Wt 132.0 lb

## 2013-08-03 DIAGNOSIS — M47816 Spondylosis without myelopathy or radiculopathy, lumbar region: Secondary | ICD-10-CM

## 2013-08-03 DIAGNOSIS — M533 Sacrococcygeal disorders, not elsewhere classified: Secondary | ICD-10-CM

## 2013-08-03 DIAGNOSIS — G8929 Other chronic pain: Secondary | ICD-10-CM

## 2013-08-03 DIAGNOSIS — M7918 Myalgia, other site: Secondary | ICD-10-CM | POA: Insufficient documentation

## 2013-08-03 DIAGNOSIS — M47817 Spondylosis without myelopathy or radiculopathy, lumbosacral region: Secondary | ICD-10-CM

## 2013-08-03 DIAGNOSIS — IMO0001 Reserved for inherently not codable concepts without codable children: Secondary | ICD-10-CM

## 2013-08-03 MED ORDER — DULOXETINE HCL 30 MG PO CPEP: 30.0000 mg | ORAL_CAPSULE | Freq: Every day | ORAL | Status: DC

## 2013-08-03 MED ORDER — KETOROLAC TROMETHAMINE 30 MG/ML IJ SOLN
30.0000 mg | Freq: Once | INTRAMUSCULAR | Status: AC
  Administered 2013-08-03: 30 mg via INTRAMUSCULAR

## 2013-08-03 MED ORDER — TRAMADOL HCL 50 MG PO TABS: 100.0000 mg | ORAL_TABLET | Freq: Three times a day (TID) | ORAL | Status: DC

## 2013-08-03 NOTE — Assessment & Plan Note (Signed)
At this point she is going to schedule her left-sided L4-L5 epidural injection. Left-sided SI joint injection did not provide any response, not even temporarily.

## 2013-08-03 NOTE — Progress Notes (Signed)
  Subjective:    CC: Upper back pain  HPI: This is a very pleasant 43 year old female who I had been seeing for SI joint dysfunction and lumbar degenerative disc disease.  She comes in with a new complaint of bilateral upper shoulder and mid back pain. This is moderate, persistent without radiation, no trauma. She is also quite tearful, and feels depressed. She is not currently on any treatment for depression.  Right-sided SI joint dysfunction: Continues to do well after injection.  Left-sided lumbar degenerative disc disease: Still painful, she has not yet scheduled her L4-L5 epidural.  Past medical history, Surgical history, Family history not pertinant except as noted below, Social history, Allergies, and medications have been entered into the medical record, reviewed, and no changes needed.   Review of Systems: No fevers, chills, night sweats, weight loss, chest pain, or shortness of breath.   Objective:    General: Well Developed, well nourished, and in no acute distress. Tearful.  Neuro: Alert and oriented x3, extra-ocular muscles intact, sensation grossly intact.  HEENT: Normocephalic, atraumatic, pupils equal round reactive to light, neck supple, no masses, no lymphadenopathy, thyroid nonpalpable.  Skin: Warm and dry, no rashes. Cardiac: Regular rate and rhythm, no murmurs rubs or gallops, no lower extremity edema.  Respiratory: Clear to auscultation bilaterally. Not using accessory muscles, speaking in full sentences. Neck: Negative spurling's Full neck range of motion Grip strength and sensation normal in bilateral hands Strength good C4 to T1 distribution No sensory change to C4 to T1 Reflexes normal Multiple areas of tenderness to palpation bilaterally, with allodynia and hyperesthesia.  Impression and Recommendations:

## 2013-08-03 NOTE — Assessment & Plan Note (Signed)
Continue to do well after right-sided SI joint injection.

## 2013-08-03 NOTE — Assessment & Plan Note (Signed)
30 mg of Toradol intramuscular. Refill tramadol. Adding Cymbalta 30. There is certainly also an element of depression which will be helped with Cymbalta. Return in one month.

## 2013-08-11 ENCOUNTER — Telehealth: Payer: Self-pay | Admitting: Physician Assistant

## 2013-08-11 NOTE — Telephone Encounter (Signed)
Message copied by Donella Stade on Tue Aug 11, 2013  1:08 PM ------      Message from: Donella Stade      Created: Fri Jun 26, 2013 11:58 AM       Repeat pelvic ultrasound on ovarian cyst left 4.2cm grown from 4.0cm.  ------

## 2013-08-11 NOTE — Telephone Encounter (Signed)
Call pt: I have a note to repeat pelvic ultrasound to follow up on size of cyst. Would you like it ordered?

## 2013-08-17 ENCOUNTER — Telehealth: Payer: Self-pay

## 2013-08-17 NOTE — Telephone Encounter (Signed)
Carly [name] called stated that she has left 3 message on patient vm to schedule her for Epidural, patient has not returned her calls. Carly [name],CMA

## 2013-08-25 ENCOUNTER — Other Ambulatory Visit: Payer: Self-pay | Admitting: Physician Assistant

## 2013-08-31 ENCOUNTER — Other Ambulatory Visit: Payer: Self-pay | Admitting: Physician Assistant

## 2013-09-02 ENCOUNTER — Other Ambulatory Visit: Payer: Self-pay

## 2013-09-02 ENCOUNTER — Ambulatory Visit: Payer: Self-pay | Admitting: Sports Medicine

## 2013-09-02 MED ORDER — ZOLPIDEM TARTRATE 10 MG PO TABS
10.0000 mg | ORAL_TABLET | Freq: Every day | ORAL | Status: DC
Start: 2013-09-02 — End: 2013-12-02

## 2013-09-09 NOTE — Telephone Encounter (Signed)
Message copied by Donella Stade on Wed Sep 09, 2013 10:52 AM ------      Message from: Donella Stade      Created: Fri Jun 26, 2013 11:58 AM       Repeat pelvic ultrasound on ovarian cyst left 4.2cm grown from 4.0cm.  ------

## 2013-09-09 NOTE — Telephone Encounter (Signed)
i have a reminder for a 6-8 week follow up on ovarian cyst to see if growing. Would you like for me to schedule?

## 2013-09-10 ENCOUNTER — Telehealth: Payer: Self-pay | Admitting: *Deleted

## 2013-09-10 DIAGNOSIS — N83209 Unspecified ovarian cyst, unspecified side: Secondary | ICD-10-CM

## 2013-09-10 NOTE — Telephone Encounter (Signed)
Ordered

## 2013-09-10 NOTE — Telephone Encounter (Signed)
Ultrasound ordered

## 2013-09-11 ENCOUNTER — Ambulatory Visit (INDEPENDENT_AMBULATORY_CARE_PROVIDER_SITE_OTHER): Payer: PRIVATE HEALTH INSURANCE

## 2013-09-11 ENCOUNTER — Ambulatory Visit: Payer: Self-pay | Admitting: Sports Medicine

## 2013-09-11 DIAGNOSIS — N83209 Unspecified ovarian cyst, unspecified side: Secondary | ICD-10-CM

## 2013-09-21 ENCOUNTER — Ambulatory Visit (INDEPENDENT_AMBULATORY_CARE_PROVIDER_SITE_OTHER): Payer: PRIVATE HEALTH INSURANCE | Admitting: Sports Medicine

## 2013-09-21 ENCOUNTER — Encounter: Payer: Self-pay | Admitting: Sports Medicine

## 2013-09-21 VITALS — BP 132/86 | HR 84 | Ht 60.0 in

## 2013-09-21 DIAGNOSIS — M47817 Spondylosis without myelopathy or radiculopathy, lumbosacral region: Secondary | ICD-10-CM

## 2013-09-21 DIAGNOSIS — M533 Sacrococcygeal disorders, not elsewhere classified: Secondary | ICD-10-CM

## 2013-09-21 DIAGNOSIS — IMO0001 Reserved for inherently not codable concepts without codable children: Secondary | ICD-10-CM

## 2013-09-21 DIAGNOSIS — M7918 Myalgia, other site: Secondary | ICD-10-CM

## 2013-09-21 DIAGNOSIS — M130 Polyarthritis, unspecified: Secondary | ICD-10-CM

## 2013-09-21 DIAGNOSIS — M47816 Spondylosis without myelopathy or radiculopathy, lumbar region: Secondary | ICD-10-CM

## 2013-09-21 DIAGNOSIS — G8929 Other chronic pain: Secondary | ICD-10-CM

## 2013-09-21 MED ORDER — ETODOLAC ER 600 MG PO TB24: 600.0000 mg | ORAL_TABLET | Freq: Every day | ORAL | Status: DC

## 2013-09-21 MED ORDER — DULOXETINE HCL 60 MG PO CPEP: 60.0000 mg | ORAL_CAPSULE | Freq: Every day | ORAL | Status: DC

## 2013-09-21 NOTE — Assessment & Plan Note (Signed)
Overall doing very well. She did not need the left-sided L4-L5 epidural. Increasing Cymbalta, continue Lodine.

## 2013-09-21 NOTE — Assessment & Plan Note (Signed)
Increasing Cymbalta 60 mg.

## 2013-09-21 NOTE — Progress Notes (Signed)
  Subjective:    CC: Followup  HPI: Right SI joint dysfunction: Continues to do extremely well after SI joint injection months ago.  Left-sided lumbar degenerative disc disease colon pain resolved and she did not need a left-sided L4-L5 epidural.  Hand osteoarthritis: Doing well, she is having a slight recurrence of pain at the MCP and PIP joints, but not yet ready for injection, she has since discontinued her Lodine.  Myofascial pain: Improve significantly with 30 mg of Cymbalta. No suicidal or homicidal ideation.  Past medical history, Surgical history, Family history not pertinant except as noted below, Social history, Allergies, and medications have been entered into the medical record, reviewed, and no changes needed.   Review of Systems: No fevers, chills, night sweats, weight loss, chest pain, or shortness of breath.   Objective:    General: Well Developed, well nourished, and in no acute distress.  Neuro: Alert and oriented x3, extra-ocular muscles intact, sensation grossly intact.  HEENT: Normocephalic, atraumatic, pupils equal round reactive to light, neck supple, no masses, no lymphadenopathy, thyroid nonpalpable.  Skin: Warm and dry, no rashes. Cardiac: Regular rate and rhythm, no murmurs rubs or gallops, no lower extremity edema.  Respiratory: Clear to auscultation bilaterally. Not using accessory muscles, speaking in full sentences.  Impression and Recommendations:

## 2013-09-21 NOTE — Assessment & Plan Note (Signed)
Continues to be resolved after right-sided SI joint injection.

## 2013-09-21 NOTE — Assessment & Plan Note (Signed)
Restart Lodine. Refilling. Return as needed for MCP and PIP injections

## 2013-10-07 ENCOUNTER — Telehealth: Payer: Self-pay

## 2013-10-07 NOTE — Telephone Encounter (Signed)
Left detailed message on patient mvm that rx Omeprazole 20 mg was sent to her pharmacy. Rhonda Cunningham,CMA

## 2013-10-07 NOTE — Telephone Encounter (Signed)
Omeprazole 20 mg twice a day, if still no improvement then she needs to come off of the Lodine.

## 2013-10-07 NOTE — Telephone Encounter (Signed)
Patient called stated that she is taking Lodine 600 mg and she is having severe heartburns she stated that she has taken  Zantac  And that has not done anything for it. Patient wants to know what she should do. Carly [name],CMA

## 2013-10-20 ENCOUNTER — Telehealth: Payer: Self-pay

## 2013-10-20 NOTE — Telephone Encounter (Signed)
I could refill her tramadol, we can add a stronger acid blocker, and it may be time for repeat injection. Let me know which of these she would like to do? I would recommend for the acid blocker that she use over-the-counter Nexium 40.

## 2013-10-20 NOTE — Telephone Encounter (Signed)
Patient walked in the office stated that she has stopped taking the Lodine due to the abdominal pain and she is still experiencing pain all over, she want to know what she can take at this point for pain. Please advised [employer]. Rhonda Cunningham,CMA

## 2013-10-21 NOTE — Telephone Encounter (Signed)
Spoke to patient she stated that she would like a refill of the Tramadol sent to [facility]  But she would hold off on the injection for now but she will call in to schedule when its time. Reighn Kaplan,CMA

## 2013-10-22 MED ORDER — TRAMADOL HCL 50 MG PO TABS: 100.0000 mg | ORAL_TABLET | Freq: Three times a day (TID) | ORAL | Status: DC

## 2013-10-22 NOTE — Telephone Encounter (Signed)
Rx Tramadol has been faxed to [place]. Rhonda Cunningham,CMA

## 2013-10-22 NOTE — Telephone Encounter (Signed)
rx in box for faxing.

## 2013-11-11 ENCOUNTER — Other Ambulatory Visit: Payer: Self-pay | Admitting: Family Medicine

## 2013-11-20 ENCOUNTER — Ambulatory Visit (INDEPENDENT_AMBULATORY_CARE_PROVIDER_SITE_OTHER): Payer: Self-pay | Admitting: Sports Medicine

## 2013-11-20 ENCOUNTER — Encounter: Payer: Self-pay | Admitting: Sports Medicine

## 2013-11-20 VITALS — BP 144/71 | HR 91 | Wt 132.0 lb

## 2013-11-20 DIAGNOSIS — M130 Polyarthritis, unspecified: Secondary | ICD-10-CM

## 2013-11-20 DIAGNOSIS — M533 Sacrococcygeal disorders, not elsewhere classified: Secondary | ICD-10-CM

## 2013-11-20 DIAGNOSIS — G8929 Other chronic pain: Secondary | ICD-10-CM

## 2013-11-20 NOTE — Progress Notes (Signed)
  Subjective:    CC: Follow-up  HPI: SI joint dysfunction: Right-sided, last injection was 7 months ago, desires repeat.  Polyarthralgia: Needs repeat injection into the left third metacarpal phalangeal joint, pain is moderate, persistent without radiation.  Lumbar radiculopathy: Continues to be resolved.  Past medical history, Surgical history, Family history not pertinant except as noted below, Social history, Allergies, and medications have been entered into the medical record, reviewed, and no changes needed.   Review of Systems: No fevers, chills, night sweats, weight loss, chest pain, or shortness of breath.   Objective:    General: Well Developed, well nourished, and in no acute distress.  Neuro: Alert and oriented x3, extra-ocular muscles intact, sensation grossly intact.  HEENT: Normocephalic, atraumatic, pupils equal round reactive to light, neck supple, no masses, no lymphadenopathy, thyroid nonpalpable.  Skin: Warm and dry, no rashes. Cardiac: Regular rate and rhythm, no murmurs rubs or gallops, no lower extremity edema.  Respiratory: Clear to auscultation bilaterally. Not using accessory muscles, speaking in full sentences.  Procedure: Real-time Ultrasound Guided Injection of right sacroiliac joint Device: GE Logiq E  Verbal informed consent obtained.  Time-out conducted.  Noted no overlying erythema, induration, or other signs of local infection.  Skin prepped in a sterile fashion.  Local anesthesia: Topical Ethyl chloride.  With sterile technique and under real time ultrasound guidance:  Spinal needle advanced over the sacral premonitory and into the joint, 1 mL kenalog 40, 4 mL lidocaine injected easily. Completed without difficulty  Pain immediately resolved suggesting accurate placement of the medication.  Advised to call if fevers/chills, erythema, induration, drainage, or persistent bleeding.  Images permanently stored and available for review in the ultrasound  unit.  Impression: Technically successful ultrasound guided injection.  Procedure: Real-time Ultrasound Guided Injection of left third metacarpophalangeal joint Device: GE Logiq E  Verbal informed consent obtained.  Time-out conducted.  Noted no overlying erythema, induration, or other signs of local infection.  Skin prepped in a sterile fashion.  Local anesthesia: Topical Ethyl chloride.  With sterile technique and under real time ultrasound guidance:  30-gauge needle advanced into joint, 0.5 mL kenalog 40, 0.5 mL lidocaine injected easily. Completed without difficulty  Pain immediately resolved suggesting accurate placement of the medication.  Advised to call if fevers/chills, erythema, induration, drainage, or persistent bleeding.  Images permanently stored and available for review in the ultrasound unit.  Impression: Technically successful ultrasound guided injection.  Impression and Recommendations:

## 2013-11-20 NOTE — Assessment & Plan Note (Signed)
Repeat right sacral iliac joint injection as above.  Previous injection was 7 months ago.

## 2013-11-20 NOTE — Assessment & Plan Note (Signed)
Left third metacarpal phalangeal joint injection as above.

## 2013-11-24 ENCOUNTER — Other Ambulatory Visit: Payer: Self-pay | Admitting: Family Medicine

## 2013-12-02 ENCOUNTER — Other Ambulatory Visit: Payer: Self-pay | Admitting: Family Medicine

## 2013-12-15 ENCOUNTER — Other Ambulatory Visit: Payer: Self-pay | Admitting: Sports Medicine

## 2013-12-16 ENCOUNTER — Telehealth: Payer: Self-pay

## 2013-12-16 NOTE — Telephone Encounter (Signed)
Patient called requested an order for another epidural, she stated that the last one did not do any good. Cheryle Dark,CMA

## 2013-12-16 NOTE — Telephone Encounter (Signed)
Need clarification, looks like she didn't get the epidural, check imaging tab.  Where did she get an epidural?  Also shes not talking about her SIJ injection is she?

## 2013-12-21 NOTE — Telephone Encounter (Signed)
Yes certainly she can get one.   Looks like the order is already in there, have [facility] schedule it.

## 2013-12-21 NOTE — Telephone Encounter (Signed)
Spoke to patient she stated that during her last OV it was discussed that if she was not any better that she could get an epidural done. Carly [name],CMA

## 2013-12-22 NOTE — Telephone Encounter (Signed)
Left message on Danyelle from GI to call and schedule patient. Rhonda Cunningham,CMA

## 2014-01-12 ENCOUNTER — Other Ambulatory Visit: Payer: Self-pay | Admitting: Sports Medicine

## 2014-02-19 ENCOUNTER — Ambulatory Visit: Payer: Self-pay | Admitting: Sports Medicine

## 2014-02-19 DIAGNOSIS — Z0289 Encounter for other administrative examinations: Secondary | ICD-10-CM

## 2014-03-01 ENCOUNTER — Other Ambulatory Visit: Payer: Self-pay | Admitting: Sports Medicine

## 2014-03-01 ENCOUNTER — Other Ambulatory Visit: Payer: Self-pay | Admitting: Family Medicine

## 2014-03-12 ENCOUNTER — Encounter: Payer: Self-pay | Admitting: Family Medicine

## 2014-03-12 ENCOUNTER — Ambulatory Visit (INDEPENDENT_AMBULATORY_CARE_PROVIDER_SITE_OTHER): Payer: PRIVATE HEALTH INSURANCE | Admitting: Family Medicine

## 2014-03-12 VITALS — BP 153/100 | HR 93 | Ht 60.0 in | Wt 134.0 lb

## 2014-03-12 DIAGNOSIS — I10 Essential (primary) hypertension: Secondary | ICD-10-CM | POA: Diagnosis not present

## 2014-03-12 DIAGNOSIS — M791 Myalgia: Secondary | ICD-10-CM | POA: Diagnosis not present

## 2014-03-12 DIAGNOSIS — T50905A Adverse effect of unspecified drugs, medicaments and biological substances, initial encounter: Secondary | ICD-10-CM

## 2014-03-12 DIAGNOSIS — T887XXA Unspecified adverse effect of drug or medicament, initial encounter: Secondary | ICD-10-CM

## 2014-03-12 DIAGNOSIS — G4700 Insomnia, unspecified: Secondary | ICD-10-CM | POA: Diagnosis not present

## 2014-03-12 DIAGNOSIS — M7918 Myalgia, other site: Secondary | ICD-10-CM

## 2014-03-12 MED ORDER — ZOLPIDEM TARTRATE 10 MG PO TABS: 10.0000 mg | ORAL_TABLET | Freq: Every day | ORAL | Status: DC

## 2014-03-12 MED ORDER — LOSARTAN POTASSIUM 50 MG PO TABS: 50.0000 mg | ORAL_TABLET | Freq: Every day | ORAL | Status: DC

## 2014-03-12 MED ORDER — LEVOTHYROXINE SODIUM 125 MCG PO TABS: 125.0000 ug | ORAL_TABLET | Freq: Every day | ORAL | Status: DC

## 2014-03-12 NOTE — Progress Notes (Signed)
   Subjective:    Patient ID: Carly [name], female    DOB: [birth year], 44 y.o.   MRN: [id number]  HPI Hypertension- Pt denies chest pain, SOB, dizziness, or heart palpitations.  Taking meds as directed w/o problems.  Denies medication side effects.  Out of her meds for one week.   Insomnia - she uses Ambien fairly regularly. 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  She is off the cymbalta. She was started on this by Dr. Dianah Field. She says it was helpful for her measure skeletal pain but it was causing jolting sensations especially at night while trying to sleep. In fact it would wake her up recurrently at night. She would have forceful jerks of her arms and legs. She stopped it completely week ago without a taper. Her only symptom has been a little bit of nausea this week but otherwise doing well.   Review of Systems     Objective:   Physical Exam  Constitutional: She is oriented to person, place, and time. She appears well-developed and well-nourished.  HENT:  Head: Normocephalic and atraumatic.  Cardiovascular: Normal rate, regular rhythm and normal heart sounds.   Pulmonary/Chest: Effort normal and breath sounds normal.  Neurological: She is alert and oriented to person, place, and time.  Skin: Skin is warm and dry.  Psychiatric: She has a normal mood and affect. Her behavior is normal.          Assessment & Plan:  HTN - blood pressures quite elevated today but she has been out of her medications for 1 week. It was elevated when she saw our sports medicine doctor a few months ago so we are going to go ahead and increase her losartan to 50 mg and see if she does better with this. Encouraged her to check it at work if she can. 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 She is also coming back to see our sports medicine doctor in a few weeks so we can check her pressure then as well and adjust if needed.  If we do need to make another adjustment and I'll see her back in 6 weeks. If it looks great when she comes back then we will follow-up in 6 months. She plans on scheduling a physical in April and we can do blood work at that time.  Insomnia - refilled her Ambien today for 90 day supply. Well-controlled on current regimen.  Chronic back pain-now off of Cymbalta.

## 2014-03-17 ENCOUNTER — Ambulatory Visit: Payer: Self-pay | Admitting: Sports Medicine

## 2014-03-22 ENCOUNTER — Encounter: Payer: Self-pay | Admitting: Sports Medicine

## 2014-03-22 ENCOUNTER — Ambulatory Visit (INDEPENDENT_AMBULATORY_CARE_PROVIDER_SITE_OTHER): Payer: PRIVATE HEALTH INSURANCE | Admitting: Sports Medicine

## 2014-03-22 VITALS — BP 146/97 | HR 100 | Ht 61.0 in | Wt 132.0 lb

## 2014-03-22 DIAGNOSIS — M533 Sacrococcygeal disorders, not elsewhere classified: Secondary | ICD-10-CM

## 2014-03-22 DIAGNOSIS — G8929 Other chronic pain: Secondary | ICD-10-CM

## 2014-03-22 DIAGNOSIS — M130 Polyarthritis, unspecified: Secondary | ICD-10-CM

## 2014-03-22 DIAGNOSIS — M791 Myalgia: Secondary | ICD-10-CM

## 2014-03-22 DIAGNOSIS — M7918 Myalgia, other site: Secondary | ICD-10-CM

## 2014-03-22 MED ORDER — DICLOFENAC SODIUM 2 % TD SOLN: 2.0000 | Freq: Two times a day (BID) | TRANSDERMAL | Status: DC

## 2014-03-22 NOTE — Assessment & Plan Note (Signed)
Did well up to her left third MCP injection approximately 5 months ago. We have not yet injected her left third PIP joint which is hurting and swollen today. Guided injection as above. Adding topical diclofenac. Return as needed.

## 2014-03-22 NOTE — Assessment & Plan Note (Signed)
Unfortunately had some undesirable side effects with Cymbalta, particularly anorgasmia. She would be a candidate for Brintellix or Viibryd, I will defer to PCP.

## 2014-03-22 NOTE — Assessment & Plan Note (Signed)
Continues to do well after a right-sided SI joint injection 5 months ago.

## 2014-03-22 NOTE — Progress Notes (Signed)
  Subjective:    CC: Followup  HPI: Carly [name] returns, she has done very well with regards to her SI joint pain, continues to do well after the SI joint injection.  She is having some pain at the left third PIP. We injected her left third MCP last year, and she did extremely well. She does desire repeat injection. Pain is moderate, persistent and localized.  Past medical history, Surgical history, Family history not pertinant except as noted below, Social history, Allergies, and medications have been entered into the medical record, reviewed, and no changes needed.   Review of Systems: No fevers, chills, night sweats, weight loss, chest pain, or shortness of breath.   Objective:    General: Well Developed, well nourished, and in no acute distress.  Neuro: Alert and oriented x3, extra-ocular muscles intact, sensation grossly intact.  HEENT: Normocephalic, atraumatic, pupils equal round reactive to light, neck supple, no masses, no lymphadenopathy, thyroid nonpalpable.  Skin: Warm and dry, no rashes. Cardiac: Regular rate and rhythm, no murmurs rubs or gallops, no lower extremity edema.  Respiratory: Clear to auscultation bilaterally. Not using accessory muscles, speaking in full sentences. Left hand: Tender to palpation at the third PIP, with mild fusiform swelling.  Procedure: Real-time Ultrasound Guided Injection of left third PIP Device: GE Logiq E  Verbal informed consent obtained.  Time-out conducted.  Noted no overlying erythema, induration, or other signs of local infection.  Skin prepped in a sterile fashion.  Local anesthesia: Topical Ethyl chloride.  With sterile technique and under real time ultrasound guidance:  A 30-gauge needle advanced into the joint, 0.5 mL kenalog 40, 0.5 mL lidocaine injected easily. Completed without difficulty  Pain immediately resolved suggesting accurate placement of the medication.  Advised to call if fevers/chills, erythema, induration, drainage,  or persistent bleeding.  Images permanently stored and available for review in the ultrasound unit.  Impression: Technically successful ultrasound guided injection.  Impression and Recommendations:

## 2014-04-01 ENCOUNTER — Other Ambulatory Visit: Payer: Self-pay | Admitting: Sports Medicine

## 2014-04-19 ENCOUNTER — Ambulatory Visit: Payer: Self-pay | Admitting: Sports Medicine

## 2014-04-23 ENCOUNTER — Ambulatory Visit: Payer: Self-pay | Admitting: Family Medicine

## 2014-05-04 ENCOUNTER — Encounter: Payer: Self-pay | Admitting: Physician Assistant

## 2014-05-04 ENCOUNTER — Ambulatory Visit (INDEPENDENT_AMBULATORY_CARE_PROVIDER_SITE_OTHER): Payer: PRIVATE HEALTH INSURANCE | Admitting: Physician Assistant

## 2014-05-04 VITALS — BP 142/97 | HR 85 | Wt 137.0 lb

## 2014-05-04 DIAGNOSIS — N390 Urinary tract infection, site not specified: Secondary | ICD-10-CM | POA: Diagnosis not present

## 2014-05-04 DIAGNOSIS — R319 Hematuria, unspecified: Secondary | ICD-10-CM

## 2014-05-04 DIAGNOSIS — N938 Other specified abnormal uterine and vaginal bleeding: Secondary | ICD-10-CM | POA: Diagnosis not present

## 2014-05-04 LAB — POCT URINALYSIS DIPSTICK
Bilirubin, UA: NEGATIVE
GLUCOSE UA: NEGATIVE
Ketones, UA: NEGATIVE
Leukocytes, UA: NEGATIVE
Nitrite, UA: POSITIVE
Protein, UA: NEGATIVE
SPEC GRAV UA: 1.02
Urobilinogen, UA: 0.2
pH, UA: 6

## 2014-05-04 MED ORDER — CIPROFLOXACIN HCL 500 MG PO TABS: 500.0000 mg | ORAL_TABLET | Freq: Two times a day (BID) | ORAL | Status: DC

## 2014-05-04 MED ORDER — NITROFURANTOIN MONOHYD MACRO 100 MG PO CAPS: ORAL_CAPSULE | ORAL | Status: DC

## 2014-05-04 NOTE — Patient Instructions (Signed)

## 2014-05-06 DIAGNOSIS — N938 Other specified abnormal uterine and vaginal bleeding: Secondary | ICD-10-CM | POA: Insufficient documentation

## 2014-05-06 DIAGNOSIS — N390 Urinary tract infection, site not specified: Secondary | ICD-10-CM | POA: Insufficient documentation

## 2014-05-06 NOTE — Progress Notes (Signed)
   Subjective:    Patient ID: Carly [name], female    DOB: 1970/09/07, 44 y.o.   MRN: [id number]  HPI Pt presents with almost of a week of UTI symptoms. Hx of frequent UTI's. Most of time goes to employee health at her job. She has noticed a lot of UTI's happen after sex. No fever, chills, flank, or abdominal pain today. Lots of frequency, urgency, dysuria. Tried azo but little to no relief.    Review of Systems  All other systems reviewed and are negative.      Objective:   Physical Exam  Constitutional: She is oriented to person, place, and time. She appears well-developed and well-nourished.  HENT:  Head: Normocephalic and atraumatic.  Cardiovascular: Normal rate, regular rhythm and normal heart sounds.   Pulmonary/Chest: Effort normal and breath sounds normal.  No CVA tenderness.   Abdominal:  Very minimal suprapubic pain to palpation.   Neurological: She is alert and oriented to person, place, and time.  Skin: Skin is dry.  Psychiatric: She has a normal mood and affect. Her behavior is normal.          Assessment & Plan:  UTI- .Marland Kitchen Results for orders placed or performed in visit on 05/04/14  POCT urinalysis dipstick  Result Value Ref Range   Color, UA AMBER    Clarity, UA CLEAR    Glucose, UA NEGATIVE    Bilirubin, UA NEGATIVE    Ketones, UA NEGATIVE    Spec Grav, UA 1.020    Blood, UA SMALL    pH, UA 6.0    Protein, UA NEGATIVE    Urobilinogen, UA 0.2    Nitrite, UA POSITIVE    Leukocytes, UA Negative    Treated with cipro. Per pt she get's UTI and has been to employee health many times with UTI's after sex. Discussed post-coital prophylaxis. Given macrobid to take after intercourse.

## 2014-05-13 ENCOUNTER — Encounter: Payer: Self-pay | Admitting: Family Medicine

## 2014-05-13 ENCOUNTER — Ambulatory Visit (INDEPENDENT_AMBULATORY_CARE_PROVIDER_SITE_OTHER): Payer: PRIVATE HEALTH INSURANCE | Admitting: Family Medicine

## 2014-05-13 VITALS — BP 120/73 | HR 85 | Ht 61.0 in | Wt 135.0 lb

## 2014-05-13 DIAGNOSIS — R0602 Shortness of breath: Secondary | ICD-10-CM

## 2014-05-13 DIAGNOSIS — I10 Essential (primary) hypertension: Secondary | ICD-10-CM | POA: Diagnosis not present

## 2014-05-13 MED ORDER — LOSARTAN POTASSIUM 100 MG PO TABS: 100.0000 mg | ORAL_TABLET | Freq: Every day | ORAL | Status: DC

## 2014-05-13 MED ORDER — LOSARTAN POTASSIUM-HCTZ 50-12.5 MG PO TABS: 1.0000 | ORAL_TABLET | Freq: Every day | ORAL | Status: DC

## 2014-05-13 NOTE — Progress Notes (Signed)
Subjective:    Patient ID: Carly [name], female    DOB: 21-Nov-1970, 44 y.o.   MRN: [id number]  HPI Hypertension- Pt denies chest pain, SOB, dizziness, or heart palpitations.  Taking meds as directed w/o problems.  Denies medication side effects.  Has been doing well. Bp has been running high lately. She has been under a lot of stress. She feels like she has been more SOB lately, for several months she's noticed that she's just been a little bit more short of breath with activity. She says even walking up a flight of stairs she'll get a little winded. There she went hiking couple weeks ago and was able to keep up. But she did have to stop a couple times to take a deep breath. She denies any actual chest pain or discomfort. She know she has gained some weight in the last year and wonders if that could be part of it. No prior history of wheezing or asthma as a child. Has had similar sxs in the past   Review of Systems  BP 120/73 mmHg  Pulse 85  Ht 5\' 1"  (1.549 m)  Wt 135 lb (61.236 kg)  BMI 25.52 kg/m2    Allergies  Allergen Reactions  . Cymbalta [Duloxetine Hcl] Other (See Comments)    Jolting/jerking sensations.   . Sulfonamide Derivatives Hives  . Tape Rash    Use paper tape    Past Medical History  Diagnosis Date  . Thyroid disease   . PCOS (polycystic ovarian syndrome)   . Arthritis   . TIA (transient ischemic attack)   . Hypertension     Past Surgical History  Procedure Laterality Date  . Melanoma removal      RT SIDE  . Lymph nodes    . 2 ectopic pregnancies  [id number]  . Removal of fallopian tube  1999    History   Social History  . Marital Status: Married    Spouse Name: Herbie Baltimore  . Number of Children: 3  . Years of Education: N/A   Occupational History  . 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    Social History Main Topics  . Smoking status: Never Smoker   . Smokeless tobacco: Never Used  . Alcohol Use: No  . Drug Use: No  . Sexual Activity:   Partners: Male     Comment: adopted   Other Topics Concern  . Not on file   Social History Narrative   Caffeine daily. No regular exercise.  She was adopted.     Family History  Problem Relation Age of Onset  . Melanoma Mother   . Depression Mother   . Hyperlipidemia Mother   . Colon cancer Maternal Grandmother   . Colon cancer Father 74    deceased.     Outpatient Encounter Prescriptions as of 05/13/2014  Medication Sig  . Diclofenac Sodium 2 % SOLN Place 2 sprays onto the skin 2 (two) times daily.  Marland Kitchen etodolac (LODINE XL) 600 MG 24 hr tablet Take 1 tablet (600 mg total) by mouth at bedtime.  . fluticasone (FLONASE) 50 MCG/ACT nasal spray Place 2 sprays into the nose daily as needed. For seasonal allergy nasal congestion  . levothyroxine (SYNTHROID, LEVOTHROID) 125 MCG tablet Take 1 tablet (125 mcg total) by mouth daily.  . traMADol (ULTRAM) 50 MG tablet TAKE 2 TABLETS BY MOUTH EVERY 8 HOURS  . zolpidem (AMBIEN) 10 MG tablet Take 1 tablet (10 mg total) by mouth at  bedtime.  . [DISCONTINUED] losartan (COZAAR) 100 MG tablet Take 1 tablet (100 mg total) by mouth daily.  . [DISCONTINUED] losartan (COZAAR) 50 MG tablet Take 1 tablet (50 mg total) by mouth daily.  Marland Kitchen losartan-hydrochlorothiazide (HYZAAR) 50-12.5 MG per tablet Take 1 tablet by mouth daily.  . [DISCONTINUED] ciprofloxacin (CIPRO) 500 MG tablet Take 1 tablet (500 mg total) by mouth 2 (two) times daily. For 7 days  . [DISCONTINUED] nitrofurantoin, macrocrystal-monohydrate, (MACROBID) 100 MG capsule Take one tablet after intercourse to prevent UTI's.          Objective:   Physical Exam  Constitutional: She is oriented to person, place, and time. She appears well-developed and well-nourished.  HENT:  Head: Normocephalic and atraumatic.  Cardiovascular: Normal rate, regular rhythm and normal heart sounds.   Pulmonary/Chest: Effort normal and breath sounds normal.  Neurological: She is alert and oriented to person,  place, and time.  Skin: Skin is warm and dry.  Psychiatric: She has a normal mood and affect. Her behavior is normal.          Assessment & Plan:  HTN - repeat blood pressure in the office was 142/95 with a manual cuff on the right arm. We discussed increasing her medication regimen. She tends to retain fluid easily so discussed adding a low-dose diuretic. We'll need to monitor for increased urinary frequency. Will need also check a BMP. She will should follow up in one week for a physical so we can recheck her pressure into a BMP at that time. Next  Shortest of breath-she really feels this is related to her weight gain and possibly her elevated blood pressure. Certainly we can try reducing her pressure the next week to see if she starts to feel better. If not then recommend further evaluation with EKG, chest x-ray, d-dimer and possible spirometry. She has actually had problems with shortness of breath in the past and had an x-ray done which was negative. She says this was probably a couple of years ago. She's never had any wheezing. Offered to do CXR today but she declined.

## 2014-05-21 ENCOUNTER — Encounter: Payer: Self-pay | Admitting: Family Medicine

## 2014-05-21 ENCOUNTER — Ambulatory Visit (INDEPENDENT_AMBULATORY_CARE_PROVIDER_SITE_OTHER): Payer: PRIVATE HEALTH INSURANCE | Admitting: Family Medicine

## 2014-05-21 ENCOUNTER — Other Ambulatory Visit (HOSPITAL_COMMUNITY)
Admission: RE | Admit: 2014-05-21 | Discharge: 2014-05-21 | Disposition: A | Discharge status: Home / Self Care | Payer: PRIVATE HEALTH INSURANCE | Source: Ambulatory Visit | Attending: Family Medicine | Admitting: Family Medicine

## 2014-05-21 VITALS — BP 116/83 | HR 84 | Wt 132.0 lb

## 2014-05-21 DIAGNOSIS — E039 Hypothyroidism, unspecified: Secondary | ICD-10-CM

## 2014-05-21 DIAGNOSIS — Z01419 Encounter for gynecological examination (general) (routine) without abnormal findings: Secondary | ICD-10-CM

## 2014-05-21 DIAGNOSIS — Z1211 Encounter for screening for malignant neoplasm of colon: Secondary | ICD-10-CM | POA: Diagnosis not present

## 2014-05-21 DIAGNOSIS — Z9289 Personal history of other medical treatment: Secondary | ICD-10-CM | POA: Diagnosis not present

## 2014-05-21 DIAGNOSIS — Z1151 Encounter for screening for human papillomavirus (HPV): Secondary | ICD-10-CM | POA: Diagnosis present

## 2014-05-21 DIAGNOSIS — N926 Irregular menstruation, unspecified: Secondary | ICD-10-CM

## 2014-05-21 NOTE — Progress Notes (Signed)
  Patient ID: Carly [name], female   DOB: May 31, 1970, 44 y.o.   MRN: [id number]  Subjective:     Carly [name] is a 44 y.o. female and is here for a comprehensive physical exam. The patient reports problems - Says she is having irregular cycles and would like to see GYN.  Hypertension-doing well with the addition of the diuretic to losartan. She said she did notice some increased urinary frequency initially but wasn't problematic. No chest pain or shortness of breath or palpitations.    History   Social History  . Marital Status: Married    Spouse Name: Herbie Baltimore  . Number of Children: 3  . Years of Education: N/A   Occupational History  . 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    Social History Main Topics  . Smoking status: Never Smoker   . Smokeless tobacco: Never Used  . Alcohol Use: No  . Drug Use: No  . Sexual Activity:    Partners: Male     Comment: adopted   Other Topics Concern  . Not on file   Social History Narrative   Caffeine daily. No regular exercise.  She was adopted.    Health Maintenance  Topic Date Due  . HIV Screening  05/05/1984  . MAMMOGRAM  06/26/2012  . COLONOSCOPY  01/22/2014  . INFLUENZA VACCINE  08/23/2014  . PAP SMEAR  06/26/2015  . TETANUS/TDAP  01/23/2019    The following portions of the patient's history were reviewed and updated as appropriate: allergies, current medications, past family history, past medical history, past social history, past surgical history and problem list.  Review of Systems A comprehensive review of systems was negative.   Objective:    BP 116/83 mmHg  Pulse 84  Wt 132 lb (59.875 kg)  SpO2 97% General appearance: alert, cooperative and appears stated age Head: Normocephalic, without obvious abnormality, atraumatic Eyes: conj clear, EOMI, PEERLA Ears: normal TM's and external ear canals both ears Nose: Nares normal. Septum midline. Mucosa normal. No drainage or sinus tenderness. Throat: lips, mucosa,  and tongue normal; teeth and gums normal Neck: no adenopathy, no carotid bruit, no JVD, supple, symmetrical, trachea midline and thyroid not enlarged, symmetric, no tenderness/mass/nodules Back: symmetric, no curvature. ROM normal. No CVA tenderness. Lungs: clear to auscultation bilaterally Breasts: normal appearance, no masses or tenderness Heart: regular rate and rhythm, S1, S2 normal, no murmur, click, rub or gallop Abdomen: soft, non-tender; bowel sounds normal; no masses,  no organomegaly Pelvic: cervix normal in appearance, external genitalia normal, no adnexal masses or tenderness, no cervical motion tenderness, rectovaginal septum normal, uterus normal size, shape, and consistency and vagina normal without discharge Extremities: extremities normal, atraumatic, no cyanosis or edema Pulses: 2+ and symmetric Skin: Skin color, texture, turgor normal. No rashes or lesions Lymph nodes: Cervical, supraclavicular, and axillary nodes normal. Neurologic: Alert and oriented X 3, normal strength and tone. Normal symmetric reflexes. Normal coordination and gait    Assessment:    Healthy female exam.      Plan:     See After Visit Summary for Counseling Recommendations   Keep up a regular exercise program and make sure you are eating a healthy diet Lab slip given. Go when fasting.  Due for repeat colonoscopy.   Due for mammogram.     Hypertension-well-controlled. Continue current regimen. Follow up in 6 months.  Refer to GYN for abnormal bleeding.

## 2014-05-21 NOTE — Patient Instructions (Signed)
Keep up a regular exercise program and make sure you are eating a healthy diet Try to eat 4 servings of dairy a day, or if you are lactose intolerant take a calcium with vitamin D daily.  Your vaccines are up to date.   

## 2014-05-25 LAB — CYTOLOGY - PAP

## 2014-05-31 ENCOUNTER — Ambulatory Visit (INDEPENDENT_AMBULATORY_CARE_PROVIDER_SITE_OTHER): Payer: PRIVATE HEALTH INSURANCE | Admitting: Physician Assistant

## 2014-05-31 ENCOUNTER — Encounter: Payer: Self-pay | Admitting: Physician Assistant

## 2014-05-31 VITALS — BP 119/71 | HR 69 | Ht 61.0 in | Wt 135.0 lb

## 2014-05-31 DIAGNOSIS — R000 Tachycardia, unspecified: Secondary | ICD-10-CM | POA: Diagnosis not present

## 2014-05-31 DIAGNOSIS — R5383 Other fatigue: Secondary | ICD-10-CM | POA: Diagnosis not present

## 2014-05-31 DIAGNOSIS — R0602 Shortness of breath: Secondary | ICD-10-CM | POA: Diagnosis not present

## 2014-05-31 MED ORDER — LEVOTHYROXINE SODIUM 112 MCG PO TABS: 112.0000 ug | ORAL_TABLET | Freq: Every day | ORAL | Status: DC

## 2014-05-31 NOTE — Progress Notes (Signed)
   Subjective:    Patient ID: Carly [name], female    DOB: Feb 08, 1970, 44 y.o.   MRN: [id number]  HPI Pt presents to the clinic to follow up after ER visit 05/29/14 for SOB, fatigue and episodes of tachycardia. On 5/7 she was sitting at her desk when she got up her pulse began to race and she felt very fatigued and short of breath. This had happened a few times before but not this bad. She did present to the emergency room. There were serial EKGs done that were normal. She had a chest x-ray done that was normal. She did have an echo done that showed 65-70 ejection fraction. The only abnormality was her TSH at 0.209. She is currently taking levothyroxine for hypothyroidism. Her hemoglobin was 12.7. She does have a history of anemia, B12 deficiency and vitamin D deficiency. She does admit to feeling very fatigued and at times and that should not cause her to be short of breath short of breath. She denies any wheezing or history of lung disease.  Review of Systems  All other systems reviewed and are negative.      Objective:   Physical Exam  Constitutional: She is oriented to person, place, and time. She appears well-developed and well-nourished.  HENT:  Head: Normocephalic and atraumatic.  Cardiovascular: Normal rate, regular rhythm and normal heart sounds.   Pulmonary/Chest: Effort normal and breath sounds normal.  Neurological: She is alert and oriented to person, place, and time.  Skin: Skin is dry. There is pallor.  Psychiatric: She has a normal mood and affect. Her behavior is normal.          Assessment & Plan:  TSH decreased/SOB/tachycardia/fatigue- will decrease levothyroxine and recheck via lab in 4-6 weeks. Will check B12, vitamin d, ferritin she has a hx of all of these being decreased in past and needing medication. Certainly some of her symptoms could be due to thyroid or other metabolic conditions. If symptoms worsening or not improving consider holter monitor to see if can  catch pulse elevations.     Never had fasting labs drawn. Reprinted for patient to have drawn.

## 2014-06-08 ENCOUNTER — Other Ambulatory Visit: Payer: Self-pay | Admitting: Family Medicine

## 2014-06-10 LAB — COMPLETE METABOLIC PANEL WITH GFR
ALBUMIN: 4.1 g/dL (ref 3.5–5.2)
ALK PHOS: 49 U/L (ref 39–117)
ALT: 10 U/L (ref 0–35)
AST: 15 U/L (ref 0–37)
BUN: 11 mg/dL (ref 6–23)
CO2: 25 mEq/L (ref 19–32)
Calcium: 9 mg/dL (ref 8.4–10.5)
Chloride: 101 mEq/L (ref 96–112)
Creat: 0.94 mg/dL (ref 0.50–1.10)
GFR, EST NON AFRICAN AMERICAN: 74 mL/min
GFR, Est African American: 85 mL/min
GLUCOSE: 78 mg/dL (ref 70–99)
POTASSIUM: 3.7 meq/L (ref 3.5–5.3)
Sodium: 139 mEq/L (ref 135–145)
Total Bilirubin: 0.5 mg/dL (ref 0.2–1.2)
Total Protein: 6.9 g/dL (ref 6.0–8.3)

## 2014-06-10 LAB — LIPID PANEL
Cholesterol: 175 mg/dL (ref 0–200)
HDL: 49 mg/dL (ref >=46)
LDL CALC: 107 mg/dL — AB (ref 0–99)
Total CHOL/HDL Ratio: 3.6 Ratio
Triglycerides: 95 mg/dL (ref <150)
VLDL: 19 mg/dL (ref 0–40)

## 2014-06-10 LAB — VITAMIN D 25 HYDROXY (VIT D DEFICIENCY, FRACTURES): Vit D, 25-Hydroxy: 14 ng/mL — ABNORMAL LOW (ref 30–100)

## 2014-06-10 LAB — VITAMIN B12: VITAMIN B 12: 223 pg/mL (ref 211–911)

## 2014-06-10 LAB — FERRITIN: Ferritin: 10 ng/mL (ref 10–291)

## 2014-06-14 ENCOUNTER — Ambulatory Visit (INDEPENDENT_AMBULATORY_CARE_PROVIDER_SITE_OTHER): Payer: PRIVATE HEALTH INSURANCE | Admitting: Sports Medicine

## 2014-06-14 ENCOUNTER — Encounter: Payer: Self-pay | Admitting: Sports Medicine

## 2014-06-14 DIAGNOSIS — M47816 Spondylosis without myelopathy or radiculopathy, lumbar region: Secondary | ICD-10-CM | POA: Diagnosis not present

## 2014-06-14 MED ORDER — PREDNISONE 10 MG (21) PO TBPK: ORAL_TABLET | ORAL | Status: DC

## 2014-06-14 MED ORDER — HYDROCODONE-ACETAMINOPHEN 5-325 MG PO TABS: 1.0000 | ORAL_TABLET | Freq: Three times a day (TID) | ORAL | Status: DC | PRN

## 2014-06-14 NOTE — Assessment & Plan Note (Signed)
Most likely represents a lumbar strain after moving her daughter out of high school. Prednisone taper, short course of hydrocodone, predominantly extension related exercises, pain is discogenic. Again she would be a candidate for a left L4-L5 epidural should predominantly left-sided symptoms persist, or a right-sided SI joint injection should right-sided symptoms persist.

## 2014-06-14 NOTE — Progress Notes (Signed)
  Subjective:    CC: Acute low back pain  HPI: This is a pleasant 44 year old female, earlier she was helping her daughter move out of school, while trying to lift a heavy box, she felt immediate pain in her low back with radiation down to the buttocks but not past the knees. Pain is severe, persistent. No bowel or bladder dysfunction, saddle numbness or constitutional symptoms. Pain is worse with sitting, flexion, Valsalva.  Past medical history, Surgical history, Family history not pertinant except as noted below, Social history, Allergies, and medications have been entered into the medical record, reviewed, and no changes needed.   Review of Systems: No fevers, chills, night sweats, weight loss, chest pain, or shortness of breath.   Objective:    General: Well Developed, well nourished, and in no acute distress.  Neuro: Alert and oriented x3, extra-ocular muscles intact, sensation grossly intact.  HEENT: Normocephalic, atraumatic, pupils equal round reactive to light, neck supple, no masses, no lymphadenopathy, thyroid nonpalpable.  Skin: Warm and dry, no rashes. Cardiac: Regular rate and rhythm, no murmurs rubs or gallops, no lower extremity edema.  Respiratory: Clear to auscultation bilaterally. Not using accessory muscles, speaking in full sentences. Back Exam:  Inspection: Unremarkable  Motion: Flexion 45 deg, Extension 45 deg, Side Bending to 45 deg bilaterally,  Rotation to 45 deg bilaterally  SLR laying: Negative  XSLR laying: Negative  Palpable tenderness: Bilateral lower lumbar paraspinal musculature. FABER: negative. Sensory change: Gross sensation intact to all lumbar and sacral dermatomes.  Reflexes: 2+ at both patellar tendons, 2+ at achilles tendons, Babinski's downgoing.  Strength at foot  Plantar-flexion: 5/5 Dorsi-flexion: 5/5 Eversion: 5/5 Inversion: 5/5  Leg strength  Quad: 5/5 Hamstring: 5/5 Hip flexor: 5/5 Hip abductors: 5/5  Gait unremarkable.  Impression  and Recommendations:

## 2014-07-02 ENCOUNTER — Ambulatory Visit (INDEPENDENT_AMBULATORY_CARE_PROVIDER_SITE_OTHER): Payer: PRIVATE HEALTH INSURANCE | Admitting: Family Medicine

## 2014-07-02 ENCOUNTER — Encounter: Payer: Self-pay | Admitting: Family Medicine

## 2014-07-02 VITALS — BP 118/79 | HR 98 | Wt 138.0 lb

## 2014-07-02 DIAGNOSIS — S90821A Blister (nonthermal), right foot, initial encounter: Secondary | ICD-10-CM | POA: Diagnosis not present

## 2014-07-02 DIAGNOSIS — L03818 Cellulitis of other sites: Secondary | ICD-10-CM | POA: Diagnosis not present

## 2014-07-02 MED ORDER — DOXYCYCLINE HYCLATE 100 MG PO TABS: 100.0000 mg | ORAL_TABLET | Freq: Two times a day (BID) | ORAL | Status: DC

## 2014-07-02 NOTE — Progress Notes (Addendum)
   Subjective:    Patient ID: Carly [name], female    DOB: [birth year], 44 y.o.   MRN: [id number]  HPI bottom of R foot x 2 wks thought that it may have been a wart she has been using compound W to treat now using banaid the area is tender.  She noticed it was red around the wound.   No worsening or alleviating factors.    Review of Systems     Objective:   Physical Exam  Constitutional: She is oriented to person, place, and time. She appears well-developed and well-nourished.  HENT:  Head: Normocephalic and atraumatic.  Neurological: She is alert and oriented to person, place, and time.  Skin: Skin is warm and dry.  On ball of right foot she has a blister.  Blade used to remove the dead tissue.  No drianage.  Some surrounding erythema towards her toes and towards the middle of the foot.   Psychiatric: She has a normal mood and affect. Her behavior is normal.          Assessment & Plan:  Blister on bottom of foot.  Likely from the salicylic acid she was using for the wart.  Since she does have some surrounding erythema will tx with doxy to cover for MRA>

## 2014-07-12 ENCOUNTER — Ambulatory Visit (INDEPENDENT_AMBULATORY_CARE_PROVIDER_SITE_OTHER): Payer: PRIVATE HEALTH INSURANCE | Admitting: Sports Medicine

## 2014-07-12 ENCOUNTER — Encounter: Payer: Self-pay | Admitting: Sports Medicine

## 2014-07-12 VITALS — BP 127/79 | HR 80 | Ht 61.0 in | Wt 136.0 lb

## 2014-07-12 DIAGNOSIS — M47896 Other spondylosis, lumbar region: Secondary | ICD-10-CM

## 2014-07-12 MED ORDER — TRAMADOL HCL 50 MG PO TABS: ORAL_TABLET | ORAL | Status: DC

## 2014-07-12 NOTE — Progress Notes (Signed)
  Subjective:    CC: Follow-up  HPI: Lyrah returns, she is done very well since straining her lumbar spine, she continues to have some pain, but no near like before, it is at the point where she feels as though she can live with. Typically her right-sided pain has responded well to sacroiliac joint injections, and left-sided pain is responded well to epidurals. Unfortunately her etodolac has created some degree of gastritis, she has stopped the etodolac and is using proton inhibitors now. Tramadol however in the meantime has been acceptable.  Past medical history, Surgical history, Family history not pertinant except as noted below, Social history, Allergies, and medications have been entered into the medical record, reviewed, and no changes needed.   Review of Systems: No fevers, chills, night sweats, weight loss, chest pain, or shortness of breath.   Objective:    General: Well Developed, well nourished, and in no acute distress.  Neuro: Alert and oriented x3, extra-ocular muscles intact, sensation grossly intact.  HEENT: Normocephalic, atraumatic, pupils equal round reactive to light, neck supple, no masses, no lymphadenopathy, thyroid nonpalpable.  Skin: Warm and dry, no rashes. Cardiac: Regular rate and rhythm, no murmurs rubs or gallops, no lower extremity edema.  Respiratory: Clear to auscultation bilaterally. Not using accessory muscles, speaking in full sentences.  Impression and Recommendations:

## 2014-07-12 NOTE — Assessment & Plan Note (Signed)
Improved significantly since last visit. Next line still has predominant right-sided pain most likely referable to the sacral iliac joint. Pain is not severe enough to do an injection today. Unfortunately the total lack has created some intractable gastritis we have discontinued this medication, she will continue her H2 blockers and proton pump inhibitors until symptoms resolve, then after a couple of weeks off of proton pump inhibitors we can do a urease breath test, this can be done through her PCP. Tramadol refilled. Return on an as-needed basis.

## 2014-08-05 ENCOUNTER — Other Ambulatory Visit: Payer: Self-pay | Admitting: *Deleted

## 2014-08-05 MED ORDER — LEVOTHYROXINE SODIUM 112 MCG PO TABS: 112.0000 ug | ORAL_TABLET | Freq: Every day | ORAL | Status: DC

## 2014-08-06 ENCOUNTER — Other Ambulatory Visit: Payer: Self-pay | Admitting: Physician Assistant

## 2014-08-13 ENCOUNTER — Other Ambulatory Visit: Payer: Self-pay | Admitting: Family Medicine

## 2014-08-13 DIAGNOSIS — E039 Hypothyroidism, unspecified: Secondary | ICD-10-CM

## 2014-08-13 DIAGNOSIS — R7989 Other specified abnormal findings of blood chemistry: Secondary | ICD-10-CM

## 2014-08-13 DIAGNOSIS — E538 Deficiency of other specified B group vitamins: Secondary | ICD-10-CM

## 2014-08-13 DIAGNOSIS — R790 Abnormal level of blood mineral: Secondary | ICD-10-CM

## 2014-08-14 LAB — VITAMIN B12: VITAMIN B 12: 283 pg/mL (ref 211–911)

## 2014-08-14 LAB — TSH: TSH: 5.097 u[IU]/mL — ABNORMAL HIGH (ref 0.350–4.500)

## 2014-08-14 LAB — VITAMIN D 25 HYDROXY (VIT D DEFICIENCY, FRACTURES): Vit D, 25-Hydroxy: 25 ng/mL — ABNORMAL LOW (ref 30–100)

## 2014-08-14 LAB — FERRITIN: FERRITIN: 14 ng/mL (ref 10–291)

## 2014-08-16 ENCOUNTER — Other Ambulatory Visit: Payer: Self-pay | Admitting: *Deleted

## 2014-08-16 DIAGNOSIS — R7989 Other specified abnormal findings of blood chemistry: Secondary | ICD-10-CM

## 2014-08-16 DIAGNOSIS — E039 Hypothyroidism, unspecified: Secondary | ICD-10-CM

## 2014-08-16 DIAGNOSIS — E538 Deficiency of other specified B group vitamins: Secondary | ICD-10-CM

## 2014-08-16 DIAGNOSIS — R790 Abnormal level of blood mineral: Secondary | ICD-10-CM

## 2014-08-16 MED ORDER — LEVOTHYROXINE SODIUM 125 MCG PO TABS: 125.0000 ug | ORAL_TABLET | Freq: Every day | ORAL | Status: DC

## 2014-08-16 NOTE — Addendum Note (Signed)
Addended by: Beatrice Lecher D on: 08/16/2014 09:05 AM   Modules accepted: Orders

## 2014-08-24 LAB — HM COLONOSCOPY

## 2014-08-29 ENCOUNTER — Other Ambulatory Visit: Payer: Self-pay | Admitting: Sports Medicine

## 2014-09-08 ENCOUNTER — Other Ambulatory Visit: Payer: Self-pay | Admitting: Family Medicine

## 2014-09-09 ENCOUNTER — Other Ambulatory Visit: Payer: Self-pay | Admitting: *Deleted

## 2014-09-09 MED ORDER — ZOLPIDEM TARTRATE 10 MG PO TABS: 10.0000 mg | ORAL_TABLET | Freq: Every day | ORAL | Status: DC

## 2014-10-06 ENCOUNTER — Other Ambulatory Visit: Payer: Self-pay | Admitting: Sports Medicine

## 2014-10-08 ENCOUNTER — Encounter: Payer: Self-pay | Admitting: Sports Medicine

## 2014-10-08 ENCOUNTER — Ambulatory Visit (INDEPENDENT_AMBULATORY_CARE_PROVIDER_SITE_OTHER): Payer: PRIVATE HEALTH INSURANCE | Admitting: Sports Medicine

## 2014-10-08 DIAGNOSIS — M130 Polyarthritis, unspecified: Secondary | ICD-10-CM

## 2014-10-08 MED ORDER — PREGABALIN 75 MG PO CAPS: 75.0000 mg | ORAL_CAPSULE | Freq: Two times a day (BID) | ORAL | Status: DC

## 2014-10-08 MED ORDER — NAPROXEN-ESOMEPRAZOLE 500-20 MG PO TBEC: 1.0000 | DELAYED_RELEASE_TABLET | Freq: Two times a day (BID) | ORAL | Status: DC

## 2014-10-08 MED ORDER — TRAMADOL HCL 50 MG PO TABS
50.0000 mg | ORAL_TABLET | Freq: Three times a day (TID) | ORAL | Status: DC
Start: 2014-10-08 — End: 2014-10-11

## 2014-10-08 NOTE — Progress Notes (Signed)
  Subjective:    CC: Multiple issues  HPI: Carly [name] returns, she is having a recurrence of pain in her left third proximal interphalangeal joint, she does have significant osteoarthritis, and injection 7 months ago has been effective until recently.  Polyarthralgia: She also endorses pain in both shoulders, both hips, her back, her knees. She also describes a burning sensation in her feet. We have considered myofascial pain syndrome in the past, yet we have not yet instituted a serotonin norepinephrine reuptake inhibitor. Pain is moderate, persistent.  Past medical history, Surgical history, Family history not pertinant except as noted below, Social history, Allergies, and medications have been entered into the medical record, reviewed, and no changes needed.   Review of Systems: No fevers, chills, night sweats, weight loss, chest pain, or shortness of breath.   Objective:    General: Well Developed, well nourished, and in no acute distress.  Neuro: Alert and oriented x3, extra-ocular muscles intact, sensation grossly intact.  HEENT: Normocephalic, atraumatic, pupils equal round reactive to light, neck supple, no masses, no lymphadenopathy, thyroid nonpalpable.  Skin: Warm and dry, no rashes. Cardiac: Regular rate and rhythm, no murmurs rubs or gallops, no lower extremity edema.  Respiratory: Clear to auscultation bilaterally. Not using accessory muscles, speaking in full sentences. Left hand: Tenderness and fusiform swelling at the third proximal phalangeal joint.  Procedure: Real-time Ultrasound Guided Injection of left third PIP Device: GE Logiq E  Verbal informed consent obtained.  Time-out conducted.  Noted no overlying erythema, induration, or other signs of local infection.  Skin prepped in a sterile fashion.  Local anesthesia: Topical Ethyl chloride.  With sterile technique and under real time ultrasound guidance:  30-gauge needle advanced into the dorsal left third PIP under  real time ultrasound guidance, I then injected 0.5 mL kenalog 40, 0.5 mL lidocaine. Completed without difficulty  Pain immediately resolved suggesting accurate placement of the medication.  Advised to call if fevers/chills, erythema, induration, drainage, or persistent bleeding.  Images permanently stored and available for review in the ultrasound unit.  Impression: Technically successful ultrasound guided injection.  Impression and Recommendations:    I spent 40 minutes with this patient, greater than 50% was face-to-face time counseling regarding the above diagnoses, the patient had multiple questions on fitness training and weight loss, and all questions were answered over a great deal of time. Time spent with education and answering questions was separate from time spent during the above procedure.

## 2014-10-08 NOTE — Assessment & Plan Note (Signed)
Left third MCP injection today, the most recent one was approximately 7 months ago.  She is having a flare of polyarthritis, we are going to add Lyrica, Vimovo, and some more tramadol.

## 2014-10-11 ENCOUNTER — Other Ambulatory Visit: Payer: Self-pay | Admitting: Sports Medicine

## 2014-10-12 ENCOUNTER — Telehealth: Payer: Self-pay | Admitting: Family Medicine

## 2014-10-12 ENCOUNTER — Other Ambulatory Visit: Payer: Self-pay | Admitting: Sports Medicine

## 2014-10-12 MED ORDER — TRAMADOL HCL 50 MG PO TABS: 100.0000 mg | ORAL_TABLET | Freq: Three times a day (TID) | ORAL | Status: DC

## 2014-10-12 NOTE — Telephone Encounter (Signed)
Received fax for prior authorization on Lyrica sent through cover my meds waiting on authorization. - CF

## 2014-10-13 ENCOUNTER — Encounter: Payer: Self-pay | Admitting: Sports Medicine

## 2014-10-14 ENCOUNTER — Ambulatory Visit (INDEPENDENT_AMBULATORY_CARE_PROVIDER_SITE_OTHER): Payer: PRIVATE HEALTH INSURANCE | Admitting: Family Medicine

## 2014-10-14 ENCOUNTER — Encounter: Payer: Self-pay | Admitting: Family Medicine

## 2014-10-14 VITALS — BP 112/72 | HR 89 | Ht 60.63 in | Wt 140.0 lb

## 2014-10-14 DIAGNOSIS — Z6826 Body mass index (BMI) 26.0-26.9, adult: Secondary | ICD-10-CM | POA: Diagnosis not present

## 2014-10-14 DIAGNOSIS — M130 Polyarthritis, unspecified: Secondary | ICD-10-CM | POA: Diagnosis not present

## 2014-10-14 DIAGNOSIS — R635 Abnormal weight gain: Secondary | ICD-10-CM | POA: Diagnosis not present

## 2014-10-14 MED ORDER — PHENTERMINE HCL 15 MG PO CAPS: 15.0000 mg | ORAL_CAPSULE | ORAL | Status: DC

## 2014-10-14 NOTE — Progress Notes (Signed)
   Subjective:    Patient ID: Carly [name], female    DOB: [birth year], 44 y.o.   MRN: [id number]  HPI Patient here today to discuss medical weight loss. She's having a lot of joint pain and is missing her sports medicine doctor and feels like some of this extra weight is contributing to her pain. She was 140 pounds at 5 foot 1 and has a BMI 26.  Noting it is hard to even get up the stairs. Says if tries to walk more then her feet hurt. Feels more SOB with activity.  Restarted her membership to [facility]. She work long 12 hour shift. No CP or palpiations. She stopped drinking diet soda.  Only drinkphenter water and milk.  Says feels like the Lyrica and Naproxen have been helping some to reduce her daily pain. Says on her days off spends more time in bed to rest her joints.    Review of Systems     Objective:   Physical Exam  Constitutional: She is oriented to person, place, and time. She appears well-developed and well-nourished.  HENT:  Head: Normocephalic and atraumatic.  Cardiovascular: Normal rate, regular rhythm and normal heart sounds.   Pulmonary/Chest: Effort normal and breath sounds normal.  Neurological: She is alert and oriented to person, place, and time.  Skin: Skin is warm and dry.  Psychiatric: She has a normal mood and affect. Her behavior is normal.          Assessment & Plan:  Abnormal weight gain/ BMI 26.4 -we discussed the current options for medical management on the market. We will start with phentermine. She's not having any chest pain or current symptoms such as palpitations etc. Her blood pressures very well controlled. She went on to follow-up up in one month to recheck blood pressure and weight to make sure that she's doing well on it. Going to start her with 15 mg and consider increasing at next office visit if doing well. She is Re: Started [employer] online and has re-upped her membership at [employer]. She says she thinks she might do some of the swimming  or water classes since she is having a lot of problems with her joints.  Polyarthritis-she is getting some relief from the Lyrica and the naproxen.  Time spent 20 min, > 50% s spent counseling about need for weight loss.

## 2014-10-18 ENCOUNTER — Encounter: Payer: PRIVATE HEALTH INSURANCE | Admitting: Sports Medicine

## 2014-10-20 ENCOUNTER — Other Ambulatory Visit: Payer: Self-pay | Admitting: Family Medicine

## 2014-10-20 MED ORDER — LEVONORGESTREL 20 MCG/24HR IU IUD: 1.0000 | INTRAUTERINE_SYSTEM | Freq: Once | INTRAUTERINE | Status: DC

## 2014-10-22 ENCOUNTER — Encounter: Payer: Self-pay | Admitting: Sports Medicine

## 2014-10-22 ENCOUNTER — Ambulatory Visit (INDEPENDENT_AMBULATORY_CARE_PROVIDER_SITE_OTHER): Payer: PRIVATE HEALTH INSURANCE | Admitting: Sports Medicine

## 2014-10-22 VITALS — BP 117/83 | HR 86 | Wt 142.0 lb

## 2014-10-22 DIAGNOSIS — M791 Myalgia: Secondary | ICD-10-CM

## 2014-10-22 DIAGNOSIS — M130 Polyarthritis, unspecified: Secondary | ICD-10-CM

## 2014-10-22 DIAGNOSIS — M7918 Myalgia, other site: Secondary | ICD-10-CM

## 2014-10-22 MED ORDER — PREGABALIN 100 MG PO CAPS: 100.0000 mg | ORAL_CAPSULE | Freq: Two times a day (BID) | ORAL | Status: DC

## 2014-10-22 NOTE — Progress Notes (Signed)

## 2014-10-22 NOTE — Assessment & Plan Note (Signed)
Undesirable side effects with Cymbalta, she did have a fantastic response to the addition of Lyrica increasing suspicion of myofascial pain syndrome versus fibromyalgia. She will continue her tramadol as well. I'm going to increase Lyrica to 100 mg 2-3 times per day. Also custom orthotics as above.

## 2014-11-09 ENCOUNTER — Other Ambulatory Visit: Payer: Self-pay | Admitting: Family Medicine

## 2014-11-11 ENCOUNTER — Encounter: Payer: Self-pay | Admitting: Family Medicine

## 2014-11-11 ENCOUNTER — Ambulatory Visit (INDEPENDENT_AMBULATORY_CARE_PROVIDER_SITE_OTHER): Payer: PRIVATE HEALTH INSURANCE | Admitting: Family Medicine

## 2014-11-11 VITALS — BP 104/66 | HR 96 | Temp 98.5°F | Resp 18 | Wt 141.8 lb

## 2014-11-11 DIAGNOSIS — H6983 Other specified disorders of Eustachian tube, bilateral: Secondary | ICD-10-CM

## 2014-11-11 DIAGNOSIS — H6123 Impacted cerumen, bilateral: Secondary | ICD-10-CM

## 2014-11-11 DIAGNOSIS — Z6827 Body mass index (BMI) 27.0-27.9, adult: Secondary | ICD-10-CM | POA: Diagnosis not present

## 2014-11-11 DIAGNOSIS — R635 Abnormal weight gain: Secondary | ICD-10-CM

## 2014-11-11 DIAGNOSIS — E663 Overweight: Secondary | ICD-10-CM

## 2014-11-11 MED ORDER — LIRAGLUTIDE -WEIGHT MANAGEMENT 18 MG/3ML ~~LOC~~ SOPN: 0.6000 mg | PEN_INJECTOR | Freq: Every day | SUBCUTANEOUS | Status: DC

## 2014-11-11 MED ORDER — PHENTERMINE HCL 37.5 MG PO CAPS: 37.5000 mg | ORAL_CAPSULE | ORAL | Status: DC

## 2014-11-11 NOTE — Progress Notes (Signed)
Subjective:    Patient ID: Carly [name], female    DOB: [birth year], 44 y.o.   MRN: [id number]  HPI Here for follow-up for abnormal weight gain-she's been taking the phentermine for about a month without any significant results. She has lost less than 1 pound. She is shooting for 10000 step per day. She has been trying a low carb diet. Also been following along with Weight Watchers as well. They're not strictly. She said she's actually had an increased appetite since starting Lyrica. The only thing she knows what the phentermine as I gave her a little bit more energy in the morning but did not really curb her appetite at all.    she also complains that she feels like her ears are blocked up as well. She does have problems with fall allergies and has been taking an over-the-counter and histamine. She has used Flonase on and off but not consistently. She says she feels like they need to constantly pop. No drainage or infection or fever or chills or upper respiratory infection.  Review of Systems  BP 104/66 mmHg  Pulse 96  Temp(Src) 98.5 F (36.9 C) (Oral)  Resp 18  Wt 141 lb 12.8 oz (64.32 kg)  SpO2 100%    Allergies  Allergen Reactions  . Cymbalta [Duloxetine Hcl] Other (See Comments)    Jolting/jerking sensations.   . Sulfonamide Derivatives Hives  . Tape Rash    Use paper tape    Past Medical History  Diagnosis Date  . Thyroid disease   . PCOS (polycystic ovarian syndrome)   . Arthritis   . TIA (transient ischemic attack)   . Hypertension     Past Surgical History  Procedure Laterality Date  . Melanoma removal      RT SIDE  . Lymph nodes    . 2 ectopic pregnancies  [id number]  . Removal of fallopian tube  1999    Social History   Social History  . Marital Status: Married    Spouse Name: Herbie Baltimore  . Number of Children: 3  . Years of Education: N/A   Occupational History  . 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    Social History Main Topics  . Smoking  status: Never Smoker   . Smokeless tobacco: Never Used  . Alcohol Use: No  . Drug Use: No  . Sexual Activity:    Partners: Male     Comment: adopted   Other Topics Concern  . Not on file   Social History Narrative   Caffeine daily. No regular exercise.  She was adopted.     Family History  Problem Relation Age of Onset  . Melanoma Mother   . Depression Mother   . Hyperlipidemia Mother   . Colon cancer Maternal Grandmother   . Colon cancer Father 16    deceased.     Outpatient Encounter Prescriptions as of 11/11/2014  Medication Sig  . Diclofenac Sodium 2 % SOLN Place 2 sprays onto the skin 2 (two) times daily.  . fluticasone (FLONASE) 50 MCG/ACT nasal spray Place 2 sprays into the nose daily as needed. For seasonal allergy nasal congestion  . levonorgestrel (MIRENA) 20 MCG/24HR IUD 1 Intra Uterine Device (1 each total) by Intrauterine route once. Inserted 10/19/14  . levothyroxine (SYNTHROID, LEVOTHROID) 125 MCG tablet Take 1 tablet (125 mcg total) by mouth daily.  Marland Kitchen losartan-hydrochlorothiazide (HYZAAR) 50-12.5 MG tablet TAKE 1 TABLET BY MOUTH DAILY.  . Naproxen-Esomeprazole 500-20 MG TBEC Take  1 tablet by mouth 2 (two) times daily.  . phentermine 37.5 MG capsule Take 1 capsule (37.5 mg total) by mouth every morning.  . pregabalin (LYRICA) 100 MG capsule Take 1 capsule (100 mg total) by mouth 2 (two) times daily.  . traMADol (ULTRAM) 50 MG tablet Take 2 tablets (100 mg total) by mouth every 8 (eight) hours.  Marland Kitchen zolpidem (AMBIEN) 10 MG tablet Take 1 tablet (10 mg total) by mouth at bedtime.  . [DISCONTINUED] phentermine 15 MG capsule Take 1 capsule (15 mg total) by mouth every morning.  . Liraglutide -Weight Management (SAXENDA) 18 MG/3ML SOPN Inject 0.6 mg into the skin daily. After one week can increase to 1.2mg  SQ QD   No facility-administered encounter medications on file as of 11/11/2014.          Objective:   Physical Exam  Constitutional: She is oriented to  person, place, and time. She appears well-developed and well-nourished.  HENT:  Head: Normocephalic and atraumatic.  Right Ear: External ear normal.  Left Ear: External ear normal.  Nose: Nose normal.  Left TM is clear with no abnormalities. Left canal is occluded by cerumen.  Eyes: Conjunctivae and EOM are normal. Pupils are equal, round, and reactive to light.  Neck: Neck supple. No thyromegaly present.  Cardiovascular: Normal rate, regular rhythm and normal heart sounds.   Pulmonary/Chest: Effort normal and breath sounds normal. She has no wheezes.  Lymphadenopathy:    She has no cervical adenopathy.  Neurological: She is alert and oriented to person, place, and time.  Skin: Skin is warm and dry.  Psychiatric: She has a normal mood and affect. Her behavior is normal.          Assessment & Plan:  Abnormal weight gain-discussed options. We can try going up on the phentermine to 37.5 mg she's tolerated it well and has not had a big jump in her pulse. She runs borderline tachycardic at baseline. We can also decide to switch to one of the newer agents like Sexenda. We discussed this medication potential side effects today. I will go ahead and give her prescription and a coupon for that as well in case the phentermine increased dose is not effective. I would like to see her back in 1-2 months for follow-up. Encouraged her to continue to work on exercise goals and continue to work on diet. Encouraged her to really track her caloric intake.  Eustachian tube dysfunction-recommend that she restart her nasal steroid in addition to taking her and histamine.  Cerumen impaction-ears were irrigated today. Patient tolerated well.

## 2014-11-26 ENCOUNTER — Other Ambulatory Visit: Payer: Self-pay | Admitting: *Deleted

## 2014-11-26 DIAGNOSIS — R7989 Other specified abnormal findings of blood chemistry: Secondary | ICD-10-CM

## 2014-11-26 DIAGNOSIS — E611 Iron deficiency: Secondary | ICD-10-CM

## 2014-11-26 LAB — TSH: TSH: 0.412 u[IU]/mL (ref 0.350–4.500)

## 2014-11-26 LAB — VITAMIN D 25 HYDROXY (VIT D DEFICIENCY, FRACTURES): Vit D, 25-Hydroxy: 31 ng/mL (ref 30–100)

## 2014-11-26 LAB — FERRITIN: Ferritin: 12 ng/mL (ref 10–291)

## 2014-12-02 ENCOUNTER — Encounter: Payer: Self-pay | Admitting: Family Medicine

## 2014-12-08 ENCOUNTER — Telehealth: Payer: Self-pay | Admitting: *Deleted

## 2014-12-09 MED ORDER — LEVOTHYROXINE SODIUM 125 MCG PO TABS: 125.0000 ug | ORAL_TABLET | Freq: Every day | ORAL | Status: DC

## 2014-12-09 NOTE — Telephone Encounter (Signed)
Pt called and stated that her insurance will not cover any weight loss med. Also she will need a new rx for the thyroid med to be sent to [facility].Shanah Guimaraes, Lahoma Crocker

## 2014-12-11 ENCOUNTER — Other Ambulatory Visit: Payer: Self-pay | Admitting: Family Medicine

## 2014-12-30 ENCOUNTER — Telehealth: Payer: Self-pay

## 2014-12-30 DIAGNOSIS — M130 Polyarthritis, unspecified: Secondary | ICD-10-CM

## 2014-12-30 MED ORDER — PREGABALIN 150 MG PO CAPS: 150.0000 mg | ORAL_CAPSULE | Freq: Two times a day (BID) | ORAL | Status: DC

## 2014-12-30 NOTE — Telephone Encounter (Signed)
Okay, will send over new prescription for 150 mg tab.  Beatrice Lecher, MD

## 2014-12-30 NOTE — Telephone Encounter (Signed)
Pt would like to increase her lyrica due to continued stiffness in hands that's not getting any better. Please advise

## 2014-12-31 LAB — HM MAMMOGRAPHY

## 2015-01-20 ENCOUNTER — Ambulatory Visit (INDEPENDENT_AMBULATORY_CARE_PROVIDER_SITE_OTHER): Payer: PRIVATE HEALTH INSURANCE | Admitting: Sports Medicine

## 2015-01-20 VITALS — BP 142/94 | HR 78 | Temp 98.0°F | Resp 18 | Wt 147.9 lb

## 2015-01-20 DIAGNOSIS — R635 Abnormal weight gain: Secondary | ICD-10-CM | POA: Diagnosis not present

## 2015-01-20 DIAGNOSIS — M130 Polyarthritis, unspecified: Secondary | ICD-10-CM

## 2015-01-20 DIAGNOSIS — M791 Myalgia: Secondary | ICD-10-CM | POA: Diagnosis not present

## 2015-01-20 DIAGNOSIS — M7918 Myalgia, other site: Secondary | ICD-10-CM

## 2015-01-20 MED ORDER — EXENATIDE ER 2 MG ~~LOC~~ PEN: 2.0000 mg | PEN_INJECTOR | SUBCUTANEOUS | Status: DC

## 2015-01-20 NOTE — Assessment & Plan Note (Signed)
Unable to get phentermine or Saxenda covered, we are going to try Bydureon

## 2015-01-20 NOTE — Progress Notes (Signed)
  Subjective:    CC: three-month follow-up  HPI: Hand osteoarthritis: Somewhat painful when the weather is bad, still takes both tramadol and her Vimovo, pain is minimal at this point and not bad enough to consider interphalangeal injection as we have done before.  Fibromyalgia: Excellent control on Lyrica, recently increased to 150 mg.  Abnormal weight gain: Unable to get Saxenda or phentermine covered by her insurance, eager to try something else.  Past medical history, Surgical history, Family history not pertinant except as noted below, Social history, Allergies, and medications have been entered into the medical record, reviewed, and no changes needed.   Review of Systems: No fevers, chills, night sweats, weight loss, chest pain, or shortness of breath.   Objective:    General: Well Developed, well nourished, and in no acute distress.  Neuro: Alert and oriented x3, extra-ocular muscles intact, sensation grossly intact.  HEENT: Normocephalic, atraumatic, pupils equal round reactive to light, neck supple, no masses, no lymphadenopathy, thyroid nonpalpable.  Skin: Warm and dry, no rashes. Cardiac: Regular rate and rhythm, no murmurs rubs or gallops, no lower extremity edema.  Respiratory: Clear to auscultation bilaterally. Not using accessory muscles, speaking in full sentences.  Impression and Recommendations:

## 2015-01-20 NOTE — Assessment & Plan Note (Signed)
Overall doing well, continue Lyrica, tramadol, Vimovo, not yet ready to consider additional injection.

## 2015-01-20 NOTE — Assessment & Plan Note (Signed)
Doing extremely well on Lyrica 150.

## 2015-01-28 ENCOUNTER — Telehealth: Payer: Self-pay

## 2015-01-28 DIAGNOSIS — M130 Polyarthritis, unspecified: Secondary | ICD-10-CM

## 2015-01-28 MED ORDER — PREGABALIN 200 MG PO CAPS: 200.0000 mg | ORAL_CAPSULE | Freq: Two times a day (BID) | ORAL | Status: DC

## 2015-01-28 NOTE — Telephone Encounter (Addendum)
Increasing Lyrica to 200 mg twice a day, prescription is in box.

## 2015-01-28 NOTE — Telephone Encounter (Signed)
Pt left message stating the pain in her joints has increased and would like to know if you're still willing to increase her gabapentin. Please advise.

## 2015-01-28 NOTE — Telephone Encounter (Signed)
Definitely, in the chart I see that she is on Lyrica however? If she is taking gabapentin, what dose is she currently taking?

## 2015-01-28 NOTE — Addendum Note (Signed)
Addended by: Silverio Decamp on: 01/28/2015 02:58 PM   Modules accepted: Orders

## 2015-01-28 NOTE — Telephone Encounter (Signed)
It is Lyrica that she's taking.

## 2015-02-05 ENCOUNTER — Other Ambulatory Visit: Payer: Self-pay | Admitting: Sports Medicine

## 2015-02-17 ENCOUNTER — Ambulatory Visit: Payer: Self-pay | Admitting: Family Medicine

## 2015-03-02 ENCOUNTER — Ambulatory Visit: Payer: Self-pay | Admitting: Family Medicine

## 2015-03-14 ENCOUNTER — Other Ambulatory Visit: Payer: Self-pay | Admitting: Family Medicine

## 2015-03-14 ENCOUNTER — Encounter: Payer: Self-pay | Admitting: Family Medicine

## 2015-03-14 ENCOUNTER — Ambulatory Visit (INDEPENDENT_AMBULATORY_CARE_PROVIDER_SITE_OTHER): Payer: PRIVATE HEALTH INSURANCE | Admitting: Family Medicine

## 2015-03-14 VITALS — BP 118/78 | HR 76 | Wt 149.0 lb

## 2015-03-14 DIAGNOSIS — E282 Polycystic ovarian syndrome: Secondary | ICD-10-CM

## 2015-03-14 DIAGNOSIS — Z79899 Other long term (current) drug therapy: Secondary | ICD-10-CM

## 2015-03-14 DIAGNOSIS — M7918 Myalgia, other site: Secondary | ICD-10-CM

## 2015-03-14 DIAGNOSIS — R635 Abnormal weight gain: Secondary | ICD-10-CM

## 2015-03-14 DIAGNOSIS — M791 Myalgia: Secondary | ICD-10-CM

## 2015-03-14 MED ORDER — METFORMIN HCL 500 MG PO TABS: 500.0000 mg | ORAL_TABLET | Freq: Two times a day (BID) | ORAL | Status: DC

## 2015-03-14 MED ORDER — SPIRONOLACTONE 25 MG PO TABS: 25.0000 mg | ORAL_TABLET | Freq: Every day | ORAL | Status: DC

## 2015-03-14 NOTE — Progress Notes (Signed)
Subjective:    Patient ID: Carly [name], female    DOB: Dec 15, 1970, 45 y.o.   MRN: [id number]  HPI She is here today to discuss weight gain. Ever since she's been on the Lyrica she has gained a significant amount of weight since September. That was the only thing so far that has worked really well for her to help control her myofascial pain. She's tried several different medications with her sports medicine provider in this really has been the one that has provided the most relief. I did review the data and evidently there was report of around 16% of patients reporting weight gain on the medication. So it certainly could be contributing. We previously tried phentermine low-dose and she actually gained weight on it. She tried the battery on for a little over a month and says it really didn't seem to help. She says since being on the Lyrica she started intense craving and thoughts of food.   Polycystic ovarian syndrome-she would like to consider restarting her metformin and spironolactone. She was placed on it years ago by endocrinology. It helped with some hair growth around her chin. She is wanting to retry it. She's also noticed a little hair loss on the top of her head as well.  Review of Systems  BP 118/78 mmHg  Pulse 76  Wt 149 lb (67.586 kg)  SpO2 98%    Allergies  Allergen Reactions  . Cymbalta [Duloxetine Hcl] Other (See Comments)    Jolting/jerking sensations.   . Sulfonamide Derivatives Hives  . Tape Rash    Use paper tape    Past Medical History  Diagnosis Date  . Thyroid disease   . PCOS (polycystic ovarian syndrome)   . Arthritis   . TIA (transient ischemic attack)   . Hypertension     Past Surgical History  Procedure Laterality Date  . Melanoma removal      RT SIDE  . Lymph nodes    . 2 ectopic pregnancies  [id number]  . Removal of fallopian tube  1999    Social History   Social History  . Marital Status: Married    Spouse Name: Herbie Baltimore  . Number of  Children: 3  . Years of Education: N/A   Occupational History  . 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    Social History Main Topics  . Smoking status: Never Smoker   . Smokeless tobacco: Never Used  . Alcohol Use: No  . Drug Use: No  . Sexual Activity:    Partners: Male     Comment: adopted   Other Topics Concern  . Not on file   Social History Narrative   Caffeine daily. No regular exercise.  She was adopted.     Family History  Problem Relation Age of Onset  . Melanoma Mother   . Depression Mother   . Hyperlipidemia Mother   . Colon cancer Maternal Grandmother   . Colon cancer Father 39    deceased.     Outpatient Encounter Prescriptions as of 03/14/2015  Medication Sig  . fluticasone (FLONASE) 50 MCG/ACT nasal spray Place 2 sprays into the nose daily as needed. For seasonal allergy nasal congestion  . levonorgestrel (MIRENA) 20 MCG/24HR IUD 1 Intra Uterine Device (1 each total) by Intrauterine route once. Inserted 10/19/14  . levothyroxine (SYNTHROID, LEVOTHROID) 125 MCG tablet Take 1 tablet (125 mcg total) by mouth daily.  Marland Kitchen losartan-hydrochlorothiazide (HYZAAR) 50-12.5 MG tablet TAKE 1 TABLET BY MOUTH DAILY.  Marland Kitchen  pregabalin (LYRICA) 200 MG capsule Take 1 capsule (200 mg total) by mouth 2 (two) times daily.  . traMADol (ULTRAM) 50 MG tablet Take 2 tablets (100 mg total) by mouth every 8 (eight) hours.  Marland Kitchen VIMOVO 500-20 MG TBEC TAKE ONE TABLET BY MOUTH TWICE DAILY  . zolpidem (AMBIEN) 10 MG tablet TAKE ONE TABLET BY MOUTH AT BEDTIME  . metFORMIN (GLUCOPHAGE) 500 MG tablet Take 1-2 tablets (500-1,000 mg total) by mouth 2 (two) times daily with a meal.  . spironolactone (ALDACTONE) 25 MG tablet Take 1 tablet (25 mg total) by mouth daily.  . [DISCONTINUED] Exenatide ER (BYDUREON) 2 MG PEN Inject 2 mg into the skin once a week.   No facility-administered encounter medications on file as of 03/14/2015.          Objective:   Physical Exam  Constitutional: She is  oriented to person, place, and time. She appears well-developed and well-nourished.  HENT:  Head: Normocephalic and atraumatic.  Eyes: Conjunctivae and EOM are normal.  Cardiovascular: Normal rate.   Pulmonary/Chest: Effort normal.  Neurological: She is alert and oriented to person, place, and time.  Skin: Skin is dry. No pallor.  Psychiatric: She has a normal mood and affect. Her behavior is normal.  Vitals reviewed.         Assessment & Plan:  Myofascial pain-continue with Lyrica. This certainly at some point she could consider trying something else though it sounds like she is Artie tried multiple options including gabapentin and Cymbalta. It really is what provides most relief for her so we discussed strategies to help her at least maintain her weight is not help her try to lose. Next  Abnormal weight gain-her thyroid level will be checked a couple months ago was well controlled. In fact her TSH was a little on the lower side. She plans on getting back on Weight Watchers which she has done before to help control calories. And we also discussed restarting metformin which she was on previously for PCO S. Definitely think this could help.  PCO S-will restart metformin and spironolactone. Will need to taper up metformin. I like to see her back in about 6 weeks to make sure that she is doing well. I did warn about the potential for hyperkalemia with adding spironolactone. She is Artie on an arm but she is also on HCTZ. Also combining to diuretics could be over drying for her so we'll need to monitor potassium and kidney function. BMP recommended to check in one week after starting the medications.

## 2015-03-14 NOTE — Patient Instructions (Signed)
Get back on your weight watchers.

## 2015-03-15 ENCOUNTER — Other Ambulatory Visit: Payer: Self-pay | Admitting: Family Medicine

## 2015-03-16 ENCOUNTER — Other Ambulatory Visit: Payer: Self-pay | Admitting: Sports Medicine

## 2015-04-27 ENCOUNTER — Ambulatory Visit: Payer: Self-pay | Admitting: Family Medicine

## 2015-05-03 ENCOUNTER — Ambulatory Visit (INDEPENDENT_AMBULATORY_CARE_PROVIDER_SITE_OTHER): Payer: PRIVATE HEALTH INSURANCE | Admitting: Family Medicine

## 2015-05-03 ENCOUNTER — Encounter: Payer: Self-pay | Admitting: Family Medicine

## 2015-05-03 VITALS — BP 108/68 | HR 105 | Ht 61.0 in | Wt 149.0 lb

## 2015-05-03 DIAGNOSIS — E282 Polycystic ovarian syndrome: Secondary | ICD-10-CM

## 2015-05-03 DIAGNOSIS — D509 Iron deficiency anemia, unspecified: Secondary | ICD-10-CM | POA: Diagnosis not present

## 2015-05-03 DIAGNOSIS — M791 Myalgia: Secondary | ICD-10-CM

## 2015-05-03 DIAGNOSIS — G4700 Insomnia, unspecified: Secondary | ICD-10-CM

## 2015-05-03 DIAGNOSIS — M130 Polyarthritis, unspecified: Secondary | ICD-10-CM

## 2015-05-03 DIAGNOSIS — E038 Other specified hypothyroidism: Secondary | ICD-10-CM

## 2015-05-03 DIAGNOSIS — E611 Iron deficiency: Secondary | ICD-10-CM

## 2015-05-03 DIAGNOSIS — M7918 Myalgia, other site: Secondary | ICD-10-CM

## 2015-05-03 DIAGNOSIS — R7989 Other specified abnormal findings of blood chemistry: Secondary | ICD-10-CM

## 2015-05-03 MED ORDER — TRAMADOL HCL 50 MG PO TABS: 50.0000 mg | ORAL_TABLET | Freq: Three times a day (TID) | ORAL | Status: DC | PRN

## 2015-05-03 MED ORDER — LEVOTHYROXINE SODIUM 125 MCG PO TABS: 125.0000 ug | ORAL_TABLET | Freq: Every day | ORAL | Status: DC

## 2015-05-03 MED ORDER — METFORMIN HCL 1000 MG PO TABS: 1000.0000 mg | ORAL_TABLET | Freq: Two times a day (BID) | ORAL | Status: DC

## 2015-05-03 MED ORDER — ZOLPIDEM TARTRATE 10 MG PO TABS: 10.0000 mg | ORAL_TABLET | Freq: Every day | ORAL | Status: DC

## 2015-05-03 MED ORDER — LOSARTAN POTASSIUM-HCTZ 50-12.5 MG PO TABS: 1.0000 | ORAL_TABLET | Freq: Every day | ORAL | Status: DC

## 2015-05-03 MED ORDER — PREGABALIN 50 MG PO CAPS: ORAL_CAPSULE | ORAL | Status: DC

## 2015-05-03 NOTE — Progress Notes (Signed)
   Subjective:    Patient ID: Carly [name], female    DOB: 1970-08-09, 45 y.o.   MRN: [id number]  HPI Follow-up PCO S-we decided to restart her metformin and spironolactone. Discussed tapering up the metformin just to make sure that she tolerates it is first GI side effects are concerned. She's here today for her 6-7 week follow-up. He is now taking metformin 1000 mg total once a day.  Hypothyroidism-last thyroid level was 4 months ago looked great the previous one was off.  She was also noted to have borderline vitamin D levels. Encouraged her to start a supplement. She's due to recheck those levels.  Myofascial pain syndrome-she actually decrease her Lyrica down to 200 mg instead of 2 mg twice a day. She says that overall she feels like she's doing well and really didn't notice a big difference between the 2 doses. Because the call should really like to eventually wean off of the Lyrica. She just filled a new prescription but after 30 days would like to go down to 100 mg daily. Next  Insomnia-she feels that the generic Ambien does not work as well as the brand. She says some nights she gets 2-3 hours of sleep on it.   Review of Systems     Objective:   Physical Exam  Constitutional: She is oriented to person, place, and time. She appears well-developed and well-nourished.  HENT:  Head: Normocephalic and atraumatic.  Cardiovascular: Normal rate, regular rhythm and normal heart sounds.   Pulmonary/Chest: Effort normal and breath sounds normal.  Neurological: She is alert and oriented to person, place, and time.  Skin: Skin is warm and dry.  Psychiatric: She has a normal mood and affect. Her behavior is normal.          Assessment & Plan:  PCO S-because we added spironolactone to her heart or thiazide we will need to recheck her renal function. She was supposed to go week after starting the new medication but did not so we will try to have her go for that today. Like to go ahead  and increase the metformin dose. 11 A1c of 5.2 which is fantastic. We'll go ahead and increase metformin 1000 mg twice a day.  Vit D def - due to recheck level.    Hypothyroidism-due to recheck TSH level.  Insomnia-we'll send over new prescription for branded Ambien. N  Myofascial pain-now on 2 mg of Lyrica daily. She's also taking the bone marrow though which is an anti-inflammatory. She's also requesting a refill on her tramadol today. New prescription given for taper off of her Lyrica.

## 2015-05-04 LAB — VITAMIN D 25 HYDROXY (VIT D DEFICIENCY, FRACTURES): VIT D 25 HYDROXY: 23 ng/mL — AB (ref 30–100)

## 2015-05-04 LAB — COMPLETE METABOLIC PANEL WITH GFR
ALT: 12 U/L (ref 6–29)
AST: 18 U/L (ref 10–35)
Albumin: 4.1 g/dL (ref 3.6–5.1)
Alkaline Phosphatase: 39 U/L (ref 33–115)
BILIRUBIN TOTAL: 0.6 mg/dL (ref 0.2–1.2)
BUN: 17 mg/dL (ref 7–25)
CO2: 25 mmol/L (ref 20–31)
CREATININE: 0.91 mg/dL (ref 0.50–1.10)
Calcium: 9 mg/dL (ref 8.6–10.2)
Chloride: 102 mmol/L (ref 98–110)
GFR, EST AFRICAN AMERICAN: 88 mL/min (ref >=60)
GFR, Est Non African American: 76 mL/min (ref >=60)
GLUCOSE: 103 mg/dL — AB (ref 65–99)
Potassium: 3.7 mmol/L (ref 3.5–5.3)
SODIUM: 138 mmol/L (ref 135–146)
TOTAL PROTEIN: 6.9 g/dL (ref 6.1–8.1)

## 2015-05-04 LAB — TSH: TSH: 0.3 m[IU]/L — AB

## 2015-05-04 LAB — FERRITIN: Ferritin: 16 ng/mL (ref 10–232)

## 2015-05-05 ENCOUNTER — Other Ambulatory Visit: Payer: Self-pay | Admitting: *Deleted

## 2015-05-05 MED ORDER — AMBIEN 10 MG PO TABS: 10.0000 mg | ORAL_TABLET | Freq: Every day | ORAL | Status: DC

## 2015-05-05 NOTE — Addendum Note (Signed)
Addended by: Teddy Spike on: 05/05/2015 10:22 AM   Modules accepted: Orders

## 2015-05-12 ENCOUNTER — Ambulatory Visit (INDEPENDENT_AMBULATORY_CARE_PROVIDER_SITE_OTHER): Payer: PRIVATE HEALTH INSURANCE | Admitting: Sports Medicine

## 2015-05-12 VITALS — BP 115/74 | HR 62 | Resp 16 | Wt 149.0 lb

## 2015-05-12 DIAGNOSIS — M130 Polyarthritis, unspecified: Secondary | ICD-10-CM

## 2015-05-12 NOTE — Assessment & Plan Note (Signed)
Left third PIP injection as above, it's been 6 months or so since her last injection. Continues with Lyrica, tramadol, Vimovo.

## 2015-05-12 NOTE — Progress Notes (Signed)
  Subjective:    CC: Left middle finger pain   HPI: Patient is here today for a swollen, stiff, painful left middle finger. Patient has had several injections in this finger before which typically provide relief for 4+ months. Left middle finger became increasingly stiff two weeks ago and she now has limited range of motion of the finger.  Pain is moderate, persistent, she desires repeat injection.  Past medical history, Surgical history, Family history not pertinant except as noted below, Social history, Allergies, and medications have been entered into the medical record, reviewed, and no changes needed.   Review of Systems: No fevers, chills, night sweats, weight loss, chest pain, or shortness of breath.   Objective:    General: Well Developed, well nourished, and in no acute distress.  Neuro: Alert and oriented x3, extra-ocular muscles intact, sensation grossly intact.  HEENT: Normocephalic, atraumatic, pupils equal round reactive to light, neck supple, no masses, no lymphadenopathy, thyroid nonpalpable.  Skin: Warm and dry, no rashes. Cardiac: Regular rate and rhythm, no murmurs rubs or gallops, no lower extremity edema.  Respiratory: Clear to auscultation bilaterally. Not using accessory muscles, speaking in full sentences.  Procedure: Real-time Ultrasound Guided Injection of left third PIP Device: GE Logiq E  Verbal informed consent obtained.  Time-out conducted.  Noted no overlying erythema, induration, or other signs of local infection.  Skin prepped in a sterile fashion.  Local anesthesia: Topical Ethyl chloride.  With sterile technique and under real time ultrasound guidance:  1/2 mL kenalog 40, 1/2 mL lidocaine injected with a 30-gauge needle. Completed without difficulty  Pain immediately resolved suggesting accurate placement of the medication.  Advised to call if fevers/chills, erythema, induration, drainage, or persistent bleeding.  Images permanently stored and  available for review in the ultrasound unit.  Impression: Technically successful ultrasound guided injection. Impression and Recommendations:

## 2015-05-24 ENCOUNTER — Other Ambulatory Visit: Payer: Self-pay | Admitting: Family Medicine

## 2015-05-24 DIAGNOSIS — M130 Polyarthritis, unspecified: Secondary | ICD-10-CM

## 2015-05-24 NOTE — Telephone Encounter (Signed)
Lyrica quantity should have been 47 for taper, pharmacy requesting new Rx to complete taper. Pharmacy also wants verbal to fill Brand Ambien early, Pt states the generic that was last written "doesn't work as well."

## 2015-05-25 MED ORDER — PREGABALIN 50 MG PO CAPS: ORAL_CAPSULE | ORAL | Status: DC

## 2015-05-25 NOTE — Telephone Encounter (Signed)
Ok for both

## 2015-06-20 ENCOUNTER — Other Ambulatory Visit: Payer: Self-pay | Admitting: Sports Medicine

## 2015-06-21 ENCOUNTER — Other Ambulatory Visit: Payer: Self-pay | Admitting: *Deleted

## 2015-06-21 MED ORDER — SPIRONOLACTONE 25 MG PO TABS: 25.0000 mg | ORAL_TABLET | Freq: Every day | ORAL | Status: DC

## 2015-06-28 ENCOUNTER — Ambulatory Visit (INDEPENDENT_AMBULATORY_CARE_PROVIDER_SITE_OTHER): Payer: PRIVATE HEALTH INSURANCE | Admitting: Family Medicine

## 2015-06-28 ENCOUNTER — Encounter: Payer: Self-pay | Admitting: Family Medicine

## 2015-06-28 VITALS — BP 120/83 | HR 87 | Wt 146.0 lb

## 2015-06-28 DIAGNOSIS — E039 Hypothyroidism, unspecified: Secondary | ICD-10-CM

## 2015-06-28 DIAGNOSIS — M791 Myalgia: Secondary | ICD-10-CM

## 2015-06-28 DIAGNOSIS — R0602 Shortness of breath: Secondary | ICD-10-CM

## 2015-06-28 DIAGNOSIS — M7918 Myalgia, other site: Secondary | ICD-10-CM

## 2015-06-28 NOTE — Progress Notes (Signed)
Subjective:    CC: Myofascial pain  HPI:  Myofascial pain - doing well. She has tapered off her lyrica. She ran short so had to stop abuptly. She got nauseated and vomited but is feeling much better. She has tried not to use her tramadol as well.  She has lost some weight since being off of Lyica.    Hypothyroid - we dec dose by half a tab per week as TSH was too supressed. Says she hasn't felt any different on this dose. Still feels fatigued.    Still c/o of SOB walking up steps and climbing upwards when hiking. She recently started swimming for exercise and does feel like she has really built up her tolerance.     Past medical history, Surgical history, Family history not pertinant except as noted below, Social history, Allergies, and medications have been entered into the medical record, reviewed, and corrections made.   Review of Systems: No fevers, chills, night sweats, weight loss, chest pain, or shortness of breath.   Objective:    General: Well Developed, well nourished, and in no acute distress.  Neuro: Alert and oriented x3, extra-ocular muscles intact, sensation grossly intact.  HEENT: Normocephalic, atraumatic  Skin: Warm and dry, no rashes. Cardiac: Regular rate and rhythm, no murmurs rubs or gallops, no lower extremity edema.  Respiratory: Clear to auscultation bilaterally. Not using accessory muscles, speaking in full sentences.   Impression and Recommendations:   Myofascial pain - doing well off of Lyrica.  Using tramadol sparingly    Hypothyroid - due to recheck thyroid after adjustment.  Lab Results  Component Value Date   TSH 0.30* 05/03/2015   SOB with going up steps - had previous neg cardiac W/U.  Will refer to pulmonology. No ref flag sxs.

## 2015-06-29 ENCOUNTER — Other Ambulatory Visit: Payer: Self-pay | Admitting: Family Medicine

## 2015-06-29 LAB — TSH: TSH: 4.03 m[IU]/L

## 2015-06-29 MED ORDER — SYNTHROID 125 MCG PO TABS: 125.0000 ug | ORAL_TABLET | Freq: Every day | ORAL | Status: DC

## 2015-06-30 ENCOUNTER — Telehealth: Payer: Self-pay

## 2015-06-30 ENCOUNTER — Telehealth: Payer: Self-pay | Admitting: *Deleted

## 2015-06-30 NOTE — Telephone Encounter (Signed)
Carly [name] called and states she went to urgent care yesterday, due to trouble breathing. They treated her with albuterol and Qvar

## 2015-06-30 NOTE — Telephone Encounter (Signed)
Left message asking for a return call.

## 2015-06-30 NOTE — Telephone Encounter (Signed)
Ok, plese see if she is feeling better.  Was she able to get her inhaler?

## 2015-06-30 NOTE — Telephone Encounter (Signed)
Pt lvm stating that she cannot get in with pulmonology until July 14th. She wanted to know if Dr. Madilyn Fireman can give her an inhaler until she goes. She stated that she has had a inhaler for bronchitis before. Will fwd to pcp for advice.Carly [name], Lahoma Crocker '

## 2015-06-30 NOTE — Telephone Encounter (Signed)
See note from earlier.

## 2015-07-01 ENCOUNTER — Encounter: Payer: Self-pay | Admitting: Family Medicine

## 2015-07-05 ENCOUNTER — Ambulatory Visit (INDEPENDENT_AMBULATORY_CARE_PROVIDER_SITE_OTHER): Payer: PRIVATE HEALTH INSURANCE | Admitting: Physician Assistant

## 2015-07-05 ENCOUNTER — Encounter: Payer: Self-pay | Admitting: Physician Assistant

## 2015-07-05 VITALS — BP 114/78 | HR 100 | Ht 61.0 in | Wt 149.0 lb

## 2015-07-05 DIAGNOSIS — J209 Acute bronchitis, unspecified: Secondary | ICD-10-CM

## 2015-07-05 DIAGNOSIS — J040 Acute laryngitis: Secondary | ICD-10-CM

## 2015-07-05 DIAGNOSIS — J069 Acute upper respiratory infection, unspecified: Secondary | ICD-10-CM

## 2015-07-05 MED ORDER — PREDNISONE 20 MG PO TABS: ORAL_TABLET | ORAL | Status: DC

## 2015-07-05 MED ORDER — AZITHROMYCIN 250 MG PO TABS: ORAL_TABLET | ORAL | Status: DC

## 2015-07-05 MED ORDER — HYDROCOD POLST-CPM POLST ER 10-8 MG/5ML PO SUER: 5.0000 mL | Freq: Every evening | ORAL | Status: DC | PRN

## 2015-07-05 NOTE — Progress Notes (Signed)
   Subjective:    Patient ID: Carly [name], female    DOB: Feb 26, 1970, 45 y.o.   MRN: [id number]  HPI  Patient is a 45 year old female who presents to the clinic with 10 days of cough, shortness of breath, sinus pressure and sore throat. 2 days ago she completely lost her voice. She is seen urgent care with him the note of onset systems and a chest x-ray was done. It was negative for any pneumonia. She was given albuterol and Qvar inhalers at urgent care. They have helped some with her shortness of breath and chest tightness. She continues to have no voice and productive cough. Sinus pressure seems to be worsening and very sore throat. She has tried mucinex, sudafed, flonase, advil and tylenol. Nothing seems to be helping excetp qvar and albuterol for SOB. She takes albuterol once or twice a day.  No fever, chills, body aches.   Review of Systems  All other systems reviewed and are negative.      Objective:   Physical Exam  Constitutional: She is oriented to person, place, and time. She appears well-developed and well-nourished.  HENT:  Head: Normocephalic and atraumatic.  Right Ear: External ear normal.  Left Ear: External ear normal.  TM's dull bilaterally. + light reflex.  Oropharynx erythematous with tonsils difficult to view.  Bilateral nasal turbinates red and swollen.  Tenderness to palpation over maxillary and frontal sinuses.   Eyes: Conjunctivae are normal. Right eye exhibits no discharge. Left eye exhibits no discharge.  Neck: Normal range of motion. Neck supple.  Swollen, enlarged, tender, anterior cervical lymph nodes.   Cardiovascular: Normal rate, regular rhythm and normal heart sounds.   Pulmonary/Chest: Effort normal and breath sounds normal. She has no wheezes.  Lymphadenopathy:    She has cervical adenopathy.  Neurological: She is alert and oriented to person, place, and time.  Skin: Skin is dry.  Psychiatric: She has a normal mood and affect. Her behavior is  normal.          Assessment & Plan:  URI/acute bronchitis/laryngitis- zpak, prednisone, tussinonex given. Rest and hydrate. Honey and lemon cough drops during the day for cough. Continue qvar and as needed albuterol if helping. Follow up if not improving or worsening.

## 2015-07-05 NOTE — Addendum Note (Signed)
Addended byDonella Stade on: 07/05/2015 01:28 PM   Modules accepted: Medications

## 2015-07-12 ENCOUNTER — Telehealth: Payer: Self-pay

## 2015-07-12 NOTE — Telephone Encounter (Signed)
Carly [name] called and states she still has a cough and ear fullness. She is still on prednisone but has finished antibiotic. She is out of the cough medication and would like more. She stopped taking the Sudafed last week. She would like Jade's recommendations on what she can do about the ear fullness and cough.

## 2015-07-12 NOTE — Telephone Encounter (Signed)
She will have to pick up cough syrup. Ok to refill.   If still coughing. We can add symbicort inhaler twice daily to replace qvar. Would you like to do this?

## 2015-07-13 ENCOUNTER — Other Ambulatory Visit: Payer: Self-pay | Admitting: *Deleted

## 2015-07-13 MED ORDER — HYDROCOD POLST-CPM POLST ER 10-8 MG/5ML PO SUER: 5.0000 mL | Freq: Every evening | ORAL | Status: DC | PRN

## 2015-07-13 NOTE — Telephone Encounter (Signed)
LMOM notifying pt to pick up rx.  Requested call back regarding inhaler.

## 2015-07-14 MED ORDER — BUDESONIDE-FORMOTEROL FUMARATE 80-4.5 MCG/ACT IN AERO: 2.0000 | INHALATION_SPRAY | Freq: Two times a day (BID) | RESPIRATORY_TRACT | Status: DC

## 2015-07-14 NOTE — Telephone Encounter (Signed)
Pt notified to get samples along with cough syrup rx.

## 2015-07-14 NOTE — Telephone Encounter (Signed)
Pt returned call yesterday after clinic.  She is willing to try symbicort.  Can you print this off for me? She is going to pick up cough syrup this morning as well.

## 2015-07-14 NOTE — Telephone Encounter (Signed)
I think we have some samples if she wants to save some cash money, I'll put these on your desk.

## 2015-07-30 ENCOUNTER — Other Ambulatory Visit: Payer: Self-pay | Admitting: Family Medicine

## 2015-08-01 ENCOUNTER — Other Ambulatory Visit: Payer: Self-pay | Admitting: *Deleted

## 2015-08-01 MED ORDER — SPIRONOLACTONE 25 MG PO TABS: 25.0000 mg | ORAL_TABLET | Freq: Every day | ORAL | Status: DC

## 2015-08-05 ENCOUNTER — Institutional Professional Consult (permissible substitution): Payer: Self-pay | Admitting: Pulmonary Disease

## 2015-08-30 ENCOUNTER — Other Ambulatory Visit: Payer: Self-pay | Admitting: Family Medicine

## 2015-09-02 ENCOUNTER — Other Ambulatory Visit: Payer: Self-pay | Admitting: Family Medicine

## 2015-09-02 ENCOUNTER — Other Ambulatory Visit: Payer: Self-pay | Admitting: *Deleted

## 2015-09-02 NOTE — Telephone Encounter (Signed)
error 

## 2015-09-04 ENCOUNTER — Other Ambulatory Visit: Payer: Self-pay | Admitting: Family Medicine

## 2015-09-21 ENCOUNTER — Telehealth: Payer: Self-pay | Admitting: Family Medicine

## 2015-09-21 NOTE — Telephone Encounter (Signed)
I called pt and left a message for pt to call and schedule appt for insomnia with Dr. Madilyn Fireman

## 2015-09-28 ENCOUNTER — Other Ambulatory Visit: Payer: Self-pay | Admitting: *Deleted

## 2015-09-28 DIAGNOSIS — E039 Hypothyroidism, unspecified: Secondary | ICD-10-CM

## 2015-09-28 LAB — TSH: TSH: 1.57 mIU/L

## 2015-09-30 ENCOUNTER — Other Ambulatory Visit: Payer: Self-pay

## 2015-09-30 MED ORDER — SYNTHROID 125 MCG PO TABS: 125.0000 ug | ORAL_TABLET | Freq: Every day | ORAL | 2 refills | Status: DC

## 2015-10-04 ENCOUNTER — Ambulatory Visit (INDEPENDENT_AMBULATORY_CARE_PROVIDER_SITE_OTHER): Payer: PRIVATE HEALTH INSURANCE | Admitting: Family Medicine

## 2015-10-04 ENCOUNTER — Encounter: Payer: Self-pay | Admitting: Family Medicine

## 2015-10-04 VITALS — BP 105/63 | HR 62 | Ht 61.0 in | Wt 144.0 lb

## 2015-10-04 DIAGNOSIS — K2100 Gastro-esophageal reflux disease with esophagitis, without bleeding: Secondary | ICD-10-CM

## 2015-10-04 DIAGNOSIS — G4700 Insomnia, unspecified: Secondary | ICD-10-CM

## 2015-10-04 DIAGNOSIS — J4530 Mild persistent asthma, uncomplicated: Secondary | ICD-10-CM | POA: Insufficient documentation

## 2015-10-04 MED ORDER — ZOLPIDEM TARTRATE 10 MG PO TABS: 10.0000 mg | ORAL_TABLET | Freq: Every day | ORAL | 4 refills | Status: DC

## 2015-10-04 MED ORDER — OMEPRAZOLE 40 MG PO CPDR
40.0000 mg | DELAYED_RELEASE_CAPSULE | Freq: Every day | ORAL | 3 refills | Status: DC
Start: 2015-10-04 — End: 2016-01-09

## 2015-10-04 NOTE — Progress Notes (Signed)
Subjective:    CC: F/U insomnia  HPI:  Insomnia - She still really struggling with her sleep. She works 12 hour shifts at work. Last week she works 80 hours. The time she comes home she takes her Ambien. Sometimes she can fall asleep within 15 minutes sometimes it can take up to an hour. And then usually around 3:57 AM she wakes up. Almost like clockwork. She says that her mind starts racing about the day and she is unable to fall back asleep.  She's been working really hard to lose weight. She has joined [employer] and has really over hold her diet. She's cut out all soda and has cut out sweets. She's been trying to swim and walk for exercise and has lost 12 pounds. Her goal weight is around 120 pounds but she says she'll be happier if she gets under 130.  She did see the pulmonologist. He said that her testing was borderline diagnostic for asthma so they're going to treat her for asthma. He discontinue the Symbicort and switch her to Qvar. She has done well overall and says really the only time she notices some shortness of breath and symptoms is when she is really pushing her exercise or hiking. She says she also started experiencing reflux and he put her on omeprazole 20 mg. She was still having some breakthrough symptoms that he increase it to 40 mg and she says that has made a big difference as well. She wants to know if she can get a prescription for says that she can get it cheaper through [facility].  Past medical history, Surgical history, Family history not pertinant except as noted below, Social history, Allergies, and medications have been entered into the medical record, reviewed, and corrections made.   Review of Systems: No fevers, chills, night sweats, weight loss, chest pain, or shortness of breath.   Objective:    General: Well Developed, well nourished, and in no acute distress.  Neuro: Alert and oriented x3, extra-ocular muscles intact, sensation grossly intact.   HEENT: Normocephalic, atraumatic  Skin: Warm and dry, no rashes. Cardiac: Regular rate and rhythm, no murmurs rubs or gallops, no lower extremity edema.  Respiratory: Clear to auscultation bilaterally. Not using accessory muscles, speaking in full sentences.   Impression and Recommendations:    Insomnia - We discussed several options. We could consider switching medications to something like Belsomra. For now she wants to stick with the Ambien. We did discuss practicing mindfulness in the middle of night when she wakes up. Recommended [address] on You Tube. Recommend try it nightly for one week.    GERD - will send over new prescription for members all 40 mg take about 30 minutes before first meal of the day. Continue work on dietary measures to control reflux.   Asthma - can pretreat and 50 minutes before exercises with 2 puffs of albuterol before hiking. Keep follow-up with pulmonologist in October.

## 2015-10-04 NOTE — Patient Instructions (Signed)
You Tube [address] for sleep. It is mindfulness/meditation for sleep.;

## 2015-10-17 ENCOUNTER — Telehealth: Payer: Self-pay | Admitting: Family Medicine

## 2015-10-17 NOTE — Telephone Encounter (Signed)
No, since Dr. Darene Lamer put her on vimovo can stop any other PPI.

## 2015-10-17 NOTE — Telephone Encounter (Signed)
Pharmacist from [facility] called to question if Pt was supposed to take Prilosec 40mg  Rx along with Vimovo Rx. Will route to PCP for review.

## 2015-10-18 NOTE — Telephone Encounter (Signed)
Left VM for Pt and advised pharmacy.

## 2015-11-21 ENCOUNTER — Other Ambulatory Visit: Payer: Self-pay | Admitting: Family Medicine

## 2015-12-05 ENCOUNTER — Other Ambulatory Visit: Payer: Self-pay | Admitting: Family Medicine

## 2015-12-27 ENCOUNTER — Ambulatory Visit (INDEPENDENT_AMBULATORY_CARE_PROVIDER_SITE_OTHER): Payer: PRIVATE HEALTH INSURANCE | Admitting: Sports Medicine

## 2015-12-27 DIAGNOSIS — M130 Polyarthritis, unspecified: Secondary | ICD-10-CM | POA: Diagnosis not present

## 2015-12-27 MED ORDER — NAPROXEN 500 MG PO TABS: 500.0000 mg | ORAL_TABLET | Freq: Two times a day (BID) | ORAL | 3 refills | Status: DC

## 2015-12-27 NOTE — Assessment & Plan Note (Signed)
Left third PIP injection as above. Currently taking omeprazole, so we will switch from Vimovo to naproxen alone.

## 2015-12-27 NOTE — Progress Notes (Signed)
  Subjective:    CC: Finger pain  HPI: This is a pleasant 45 year old female with known left third PIP osteoarthritis, she's responded well to injections, the most recent of which was 8-10 months ago. She is also simply taking naproxen/omeprazole. Pain is now moderate, persistent, she desires repeat interventional treatment today.  Past medical history:  Negative.  See flowsheet/record as well for more information.  Surgical history: Negative.  See flowsheet/record as well for more information.  Family history: Negative.  See flowsheet/record as well for more information.  Social history: Negative.  See flowsheet/record as well for more information.  Allergies, and medications have been entered into the medical record, reviewed, and no changes needed.   Review of Systems: No fevers, chills, night sweats, weight loss, chest pain, or shortness of breath.   Objective:    General: Well Developed, well nourished, and in no acute distress.  Neuro: Alert and oriented x3, extra-ocular muscles intact, sensation grossly intact.  HEENT: Normocephalic, atraumatic, pupils equal round reactive to light, neck supple, no masses, no lymphadenopathy, thyroid nonpalpable.  Skin: Warm and dry, no rashes. Cardiac: Regular rate and rhythm, no murmurs rubs or gallops, no lower extremity edema.  Respiratory: Clear to auscultation bilaterally. Not using accessory muscles, speaking in full sentences. Left hand: Tender to palpation at the third PIP.  Procedure: Real-time Ultrasound Guided Injection of left third PIP Device: GE Logiq E  Verbal informed consent obtained.  Time-out conducted.  Noted no overlying erythema, induration, or other signs of local infection.  Skin prepped in a sterile fashion.  Local anesthesia: Topical Ethyl chloride.  With sterile technique and under real time ultrasound guidance:  1/2 mL kenalog 40, 1/2 mL lidocaine injected easily Completed without difficulty  Pain immediately  resolved suggesting accurate placement of the medication.  Advised to call if fevers/chills, erythema, induration, drainage, or persistent bleeding.  Images permanently stored and available for review in the ultrasound unit.  Impression: Technically successful ultrasound guided injection.  Impression and Recommendations:    Polyarthritis Left third PIP injection as above. Currently taking omeprazole, so we will switch from Vimovo to naproxen alone.

## 2015-12-30 ENCOUNTER — Ambulatory Visit: Payer: Self-pay | Admitting: Family Medicine

## 2015-12-30 ENCOUNTER — Ambulatory Visit: Payer: Self-pay | Admitting: Sports Medicine

## 2016-01-09 ENCOUNTER — Other Ambulatory Visit: Payer: Self-pay

## 2016-01-09 MED ORDER — OMEPRAZOLE 40 MG PO CPDR: 40.0000 mg | DELAYED_RELEASE_CAPSULE | Freq: Every day | ORAL | 3 refills | Status: DC

## 2016-01-09 MED ORDER — SYNTHROID 125 MCG PO TABS: 125.0000 ug | ORAL_TABLET | Freq: Every day | ORAL | 2 refills | Status: DC

## 2016-01-18 ENCOUNTER — Ambulatory Visit (INDEPENDENT_AMBULATORY_CARE_PROVIDER_SITE_OTHER): Payer: PRIVATE HEALTH INSURANCE | Admitting: Osteopathic Medicine

## 2016-01-18 ENCOUNTER — Encounter: Payer: Self-pay | Admitting: Osteopathic Medicine

## 2016-01-18 ENCOUNTER — Telehealth: Payer: Self-pay | Admitting: Physician Assistant

## 2016-01-18 ENCOUNTER — Encounter: Payer: Self-pay | Admitting: Family Medicine

## 2016-01-18 VITALS — BP 122/85 | HR 82 | Temp 97.4°F | Ht 61.5 in | Wt 134.0 lb

## 2016-01-18 DIAGNOSIS — H6982 Other specified disorders of Eustachian tube, left ear: Secondary | ICD-10-CM | POA: Diagnosis not present

## 2016-01-18 DIAGNOSIS — H6122 Impacted cerumen, left ear: Secondary | ICD-10-CM

## 2016-01-18 MED ORDER — AMOXICILLIN-POT CLAVULANATE 875-125 MG PO TABS: 1.0000 | ORAL_TABLET | Freq: Two times a day (BID) | ORAL | 0 refills | Status: DC

## 2016-01-18 MED ORDER — NITROFURANTOIN MONOHYD MACRO 100 MG PO CAPS: ORAL_CAPSULE | ORAL | 2 refills | Status: DC

## 2016-01-18 MED ORDER — IPRATROPIUM BROMIDE 0.03 % NA SOLN: 2.0000 | Freq: Four times a day (QID) | NASAL | 0 refills | Status: DC | PRN

## 2016-01-18 NOTE — Telephone Encounter (Signed)
Patient came in needing a refill on Nitrofurantoin. She requested that it be sent to [facility]. Thanks!

## 2016-01-18 NOTE — Progress Notes (Signed)
HPI: Carly [name] is a 45 y.o. female who presents to [facility] 01/18/16 for chief complaint of:  Chief Complaint  Patient presents with  . Cough    LEFT EAR PAIN    Acute Illness: . Location: L ear  . Quality: fullness, pressure.  . Assoc signs/symptoms: see ROS - snotty nose, aggravating but dry cough, no fever, L side neck mild sireness but not enlarged LN . Duration: 6 days . Modifying factors: has tried the following OTC/Rx medications: tried alka seltzer capsules, no cough syrup,    Past medical, social and family history reviewed. Current medications and allergies reviewed.     Review of Systems:  Constitutional: No  fever/chills  HEENT: No  headache, Yes  sore throat, No  swollen glands  Cardiovascular: No chest pain  Respiratory:Yes  cough, No  shortness of breath  Gastrointestinal: No  nausea, No  vomiting,  No  diarrhea  Musculoskeletal:   No  myalgia/arthralgia  Skin/Integument:  No  rash   Exam:  BP 122/85   Pulse 82   Temp 97.4 F (36.3 C) (Oral)   Ht 5' 1.5" (1.562 m)   Wt 134 lb (60.8 kg)   BMI 24.91 kg/m   Constitutional: VSS, see above. General Appearance: alert, well-developed, well-nourished, NAD  Eyes: Normal lids and conjunctive, non-icteric sclera, PERRLA  Ears, Nose, Mouth, Throat: Normal external inspection ears/nares/mouth/lips/gums, normal TM, MMM; posterior pharynx without erythema, without exudate, nasal mucosa normal  Neck: No masses, trachea midline. mild tender auricular LN but no enlargement lymph nodes  Respiratory: Normal respiratory effort. No  wheeze/rhonchi/rales  Cardiovascular: S1/S2 normal, no murmur/rub/gallop auscultated. RRR.  TM initially obscured by cerumen  Indication: Cerumen impaction of the L ear Medical necessity statement: On physical examination, cerumen impairs clinically significant portions of the external auditory canal, and tympanic membrane. Noted  obstructive, copious cerumen that cannot be removed without magnification and instrumentations requiring physician skills Consent: Discussed benefits and risks of procedure and verbal consent obtained Procedure: Patient was prepped for the procedure. Utilized an otoscope to assess and take note of the ear canal, the tympanic membrane, and the presence, amount, and placement of the cerumen. Gentle water irrigation and soft plastic curette was utilized to remove cerumen.  Post procedure examination: shows cerumen was completely removed. Patient tolerated procedure well. The patient is made aware that they may experience temporary vertigo, temporary hearing loss, and temporary discomfort. If these symptom last for more than 24 hours to call the clinic or proceed to the ED.     ASSESSMENT/PLAN: Treat conservatively, abx if needed, pt counseled on reasons to fill the medication and/or come get checked out  Dysfunction of left eustachian tube - Plan: ipratropium (ATROVENT) 0.03 % nasal spray, amoxicillin-clavulanate (AUGMENTIN) 875-125 MG tablet, DISCONTINUED: amoxicillin-clavulanate (AUGMENTIN) 875-125 MG tablet  Impacted cerumen of left ear     Visit summary was printed for the patient with medications and pertinent instructions for patient to review. ER/RTC precautions reviewed. All questions answered. Return if symptoms worsen or fail to improve.

## 2016-01-18 NOTE — Telephone Encounter (Signed)
All taken care of -

## 2016-01-18 NOTE — Patient Instructions (Signed)
Note: the following list assumes no pregnancy, normal liver & kidney function and no other drug interactions. Dr. Sheppard Coil has highlighted medications which are safe for you to use, but these may not be appropriate for everyone. Always ask a pharmacist or qualified medical provider if there are any questions!    Aches/Pains, Fever Acetaminophen (Tylenol) 500 mg tablets - take max 2 tablets (1000 mg) every 6 hours (4 times per day)  Ibuprofen (Motrin) 200 mg tablets - take max 4 tablets (800 mg) every 6 hours  Sinus Congestion Prescription Atrovent Cromolyn Nasal Spray (NasalCrom) 1 spray each nostril 3-4 times per day, max 6 imes per day Nasal Saline if desired Oxymetolazone (Afrin, others) sparing use due to rebound congestion Phenylephrine (Sudafed) 10 mg tablets every 4 hours (or the 12-hour formulation) Diphenhydramine (Benadryl) 25 mg tablets - take max 2 tablets every 4 hours  Cough & Sore Throat Prescription cough pills or syrups Dextromethorphan (Robitussin, others) - cough suppressant Guaifenesin (Robitussin, Mucinex, others) - expectorant (helps cough up mucus) (Dextromethorphan and Guaifenesin also come in a combination tablet) Lozenges w/ Benzocaine + Menthol (Cepacol) Honey - as much as you want! Teas which "coat the throat" - look for ingredients Elm Bark, Licorice Root, Marshmallow Root  Other Zinc Lozenges within 24 hours of symptoms onset - mixed evidence this shortens the duration of the common cold Don't waste your money on Vitamin C or Echinacea        Eustachian Tube Dysfunction Introduction The eustachian tube connects the middle ear to the back of the nose. It regulates air pressure in the middle ear by allowing air to move between the ear and nose. It also helps to drain fluid from the middle ear space. When the eustachian tube does not function properly, air pressure, fluid, or both can build up in the middle ear. Eustachian tube dysfunction can affect  one or both ears. What are the causes? This condition happens when the eustachian tube becomes blocked or cannot open normally. This may result from:  Ear infections.  Colds and other upper respiratory infections.  Allergies.  Irritation, such as from cigarette smoke or acid from the stomach coming up into the esophagus (gastroesophageal reflux).  Sudden changes in air pressure, such as from descending in an airplane.  Abnormal growths in the nose or throat, such as nasal polyps, tumors, or enlarged tissue at the back of the throat (adenoids). What increases the risk? This condition may be more likely to develop in people who smoke and people who are overweight. Eustachian tube dysfunction may also be more likely to develop in children, especially children who have:  Certain birth defects of the mouth, such as cleft palate.  Large tonsils and adenoids. What are the signs or symptoms? Symptoms of this condition may include:  A feeling of fullness in the ear.  Ear pain.  Clicking or popping noises in the ear.  Ringing in the ear.  Hearing loss.  Loss of balance. Symptoms may get worse when the air pressure around you changes, such as when you travel to an area of high elevation or fly on an airplane. How is this diagnosed? This condition may be diagnosed based on:  Your symptoms.  A physical exam of your ear, nose, and throat.  Tests, such as those that measure:  The movement of your eardrum (tympanogram).  Your hearing (audiometry). How is this treated? Treatment depends on the cause and severity of your condition. If your symptoms are mild, you may  be able to relieve your symptoms by moving air into ("popping") your ears. If you have symptoms of fluid in your ears, treatment may include:  Decongestants.  Antihistamines.  Nasal sprays or ear drops that contain medicines that reduce swelling (steroids). In some cases, you may need to have a procedure to drain the  fluid in your eardrum (myringotomy). In this procedure, a small tube is placed in the eardrum to:  Drain the fluid.  Restore the air in the middle ear space. Follow these instructions at home:  Take over-the-counter and prescription medicines only as told by your health care provider.  Use techniques to help pop your ears as recommended by your health care provider. These may include:  Chewing gum.  Yawning.  Frequent, forceful swallowing.  Closing your mouth, holding your nose closed, and gently blowing as if you are trying to blow air out of your nose.  Do not do any of the following until your health care provider approves:  Travel to high altitudes.  Fly in airplanes.  Work in a Pension scheme manager or room.  Scuba dive.  Keep your ears dry. Dry your ears completely after showering or bathing.  Do not smoke.  Keep all follow-up visits as told by your health care provider. This is important. Contact a health care provider if:  Your symptoms do not go away after treatment.  Your symptoms come back after treatment.  You are unable to pop your ears.  You have:  A fever.  Pain in your ear.  Pain in your head or neck.  Fluid draining from your ear.  Your hearing suddenly changes.  You become very dizzy.  You lose your balance. This information is not intended to replace advice given to you by your health care provider. Make sure you discuss any questions you have with your health care provider. Document Released: 02/04/2015 Document Revised: 06/16/2015 Document Reviewed: 01/27/2014  2017 Elsevier

## 2016-02-12 ENCOUNTER — Other Ambulatory Visit: Payer: Self-pay | Admitting: Family Medicine

## 2016-02-20 ENCOUNTER — Encounter: Payer: Self-pay | Admitting: Family Medicine

## 2016-02-20 ENCOUNTER — Ambulatory Visit (INDEPENDENT_AMBULATORY_CARE_PROVIDER_SITE_OTHER): Payer: PRIVATE HEALTH INSURANCE | Admitting: Family Medicine

## 2016-02-20 VITALS — BP 120/71 | HR 85 | Wt 133.0 lb

## 2016-02-20 DIAGNOSIS — K2900 Acute gastritis without bleeding: Secondary | ICD-10-CM

## 2016-02-20 MED ORDER — OMEPRAZOLE 40 MG PO CPDR: 40.0000 mg | DELAYED_RELEASE_CAPSULE | Freq: Two times a day (BID) | ORAL | 3 refills | Status: DC

## 2016-02-20 NOTE — Progress Notes (Signed)
Carly [name] is a 46 y.o. female who presents to [place] [place] today for hospital follow-up. Patient was seen in the emergency department over the weekend for central chest pain radiating to left arm. The pain started about a week after she ran out of her omeprazole. She takes NSAIDs daily for various muscle aches and pains and was using omeprazole for GI prophylaxis. She notes in the emergency department she had a chest x-ray, EKG, d-dimer and normal serial troponins. She was given a GI cocktail which helped immediately to control her pain. She was completely asymptomatic shortly after the GI cocktail. She was discharged with Carafate and asked to follow-up. She has a follow-up appointment pending for gastroenterology as well as cardiology. She has some mild residual chest pain has been taking her Carafate. She has not resumed omeprazole yet. She denies any exertional chest pain or palpitations.   Past Medical History:  Diagnosis Date  . Arthritis   . Hypertension   . PCOS (polycystic ovarian syndrome)   . Thyroid disease   . TIA (transient ischemic attack)    Past Surgical History:  Procedure Laterality Date  . 2 ectopic pregnancies  [id number]  . lymph nodes    . melanoma removal     RT SIDE  . removal of fallopian tube  1999   Social History  Substance Use Topics  . Smoking status: Never Smoker  . Smokeless tobacco: Never Used  . Alcohol use No   family history includes Colon cancer in her maternal grandmother; Colon cancer (age of onset: 61) in her father; Depression in her mother; Hyperlipidemia in her mother; Melanoma in her mother.  ROS as above:  Medications: Current Outpatient Prescriptions  Medication Sig Dispense Refill  . albuterol (PROAIR HFA) 108 (90 Base) MCG/ACT inhaler Inhale into the lungs.    . beclomethasone (QVAR) 80 MCG/ACT inhaler  Inhale into the lungs.    . fluticasone (FLONASE) 50 MCG/ACT nasal spray Place 2 sprays into the nose daily as needed. For seasonal allergy nasal congestion 16 g 1  . ipratropium (ATROVENT) 0.03 % nasal spray Place 2 sprays into both nostrils 4 (four) times daily as needed for rhinitis. 30 mL 0  . levonorgestrel (MIRENA) 20 MCG/24HR IUD 1 Intra Uterine Device (1 each total) by Intrauterine route once. Inserted 10/19/14 1 each 0  . losartan-hydrochlorothiazide (HYZAAR) 50-12.5 MG tablet TAKE 1 TABLET BY MOUTH DAILY. 90 tablet 1  . metFORMIN (GLUCOPHAGE) 1000 MG tablet TAKE 1 TABLET BY MOUTH TWO TIMES DAILY WITH MEALS 180 tablet 1  . naproxen (NAPROSYN) 500 MG tablet Take 1 tablet (500 mg total) by mouth 2 (two) times daily with a meal. 60 tablet 3  . nitrofurantoin, macrocrystal-monohydrate, (MACROBID) 100 MG capsule Take one tablet after intercourse to prevent UTI's. 30 capsule 2  . omeprazole (PRILOSEC) 40 MG capsule Take 1 capsule (40 mg total) by mouth 2 (two) times daily. 60 capsule 3  . spironolactone (ALDACTONE) 25 MG tablet TAKE 1 TABLET BY MOUTH DAILY. 30 tablet 3  . SYNTHROID 125 MCG tablet Take 1 tablet (125 mcg total) by mouth daily before breakfast. Needs TSH before more refills. 30 tablet 2  . zolpidem (AMBIEN) 10 MG tablet Take 1 tablet (10 mg total) by mouth at bedtime. 30 tablet 4   No current facility-administered medications for this visit.    Allergies  Allergen Reactions  . Cymbalta [Duloxetine Hcl] Other (See Comments)  Jolting/jerking sensations.   . Sulfonamide Derivatives Hives  . Tape Rash    Use paper tape    Health Maintenance Health Maintenance  Topic Date Due  . HIV Screening  05/05/1984  . INFLUENZA VACCINE  08/23/2015  . MAMMOGRAM  12/31/2015  . PAP SMEAR  05/20/2017  . TETANUS/TDAP  01/23/2019  . COLONOSCOPY  08/24/2019     Exam:  BP 120/71   Pulse 85   Wt 133 lb (60.3 kg)   BMI 24.72 kg/m  Gen: Well NAD Nontoxic appearing HEENT: EOMI,  MMM  no increased JVD Lungs: Normal work of breathing. CTABL Heart: RRR no MRG Abd: NABS, Soft. Nondistended, Nontender Exts: Brisk capillary refill, warm and well perfused. No edema  ED report from [facility] reviewed   No results found for this or any previous visit (from the past 72 hour(s)). No results found.    Assessment and Plan: 46 y.o. female with  Chest pain very likely gastritis versus esophageal spasm versus gastric ulcer. Plan to continue Carafate and used twice daily omeprazole. Recommend patient follow-up with gastroenterology for upper endoscopy. Recommend patient follow-up with PCP in the near future.   No orders of the defined types were placed in this encounter.   Discussed warning signs or symptoms. Please see discharge instructions. Patient expresses understanding.

## 2016-02-20 NOTE — Patient Instructions (Signed)
Thank you for coming in today. If your belly pain worsens, or you have high fever, bad vomiting, blood in your stool or black tarry stool go to the Emergency Room.  Continue caraftate for 1-2 weeks.  Take omeprazole twice daily for a month and then go to once daily.  Follow up with GI and Dr Madilyn Fireman.    Gastritis, Adult Gastritis is soreness and swelling (inflammation) of the lining of the stomach. Gastritis can develop as a sudden onset (acute) or long-term (chronic) condition. If gastritis is not treated, it can lead to stomach bleeding and ulcers. CAUSES  Gastritis occurs when the stomach lining is weak or damaged. Digestive juices from the stomach then inflame the weakened stomach lining. The stomach lining may be weak or damaged due to viral or bacterial infections. One common bacterial infection is the Helicobacter pylori infection. Gastritis can also result from excessive alcohol consumption, taking certain medicines, or having too much acid in the stomach.  SYMPTOMS  In some cases, there are no symptoms. When symptoms are present, they may include:  Pain or a burning sensation in the upper abdomen.  Nausea.  Vomiting.  An uncomfortable feeling of fullness after eating. DIAGNOSIS  Your caregiver may suspect you have gastritis based on your symptoms and a physical exam. To determine the cause of your gastritis, your caregiver may perform the following:  Blood or stool tests to check for the H pylori bacterium.  Gastroscopy. A thin, flexible tube (endoscope) is passed down the esophagus and into the stomach. The endoscope has a light and camera on the end. Your caregiver uses the endoscope to view the inside of the stomach.  Taking a tissue sample (biopsy) from the stomach to examine under a microscope. TREATMENT  Depending on the cause of your gastritis, medicines may be prescribed. If you have a bacterial infection, such as an H pylori infection, antibiotics may be given. If  your gastritis is caused by too much acid in the stomach, H2 blockers or antacids may be given. Your caregiver may recommend that you stop taking aspirin, ibuprofen, or other nonsteroidal anti-inflammatory drugs (NSAIDs). HOME CARE INSTRUCTIONS  Only take over-the-counter or prescription medicines as directed by your caregiver.  If you were given antibiotic medicines, take them as directed. Finish them even if you start to feel better.  Drink enough fluids to keep your urine clear or pale yellow.  Avoid foods and drinks that make your symptoms worse, such as:  Caffeine or alcoholic drinks.  Chocolate.  Peppermint or mint flavorings.  Garlic and onions.  Spicy foods.  Citrus fruits, such as oranges, lemons, or limes.  Tomato-based foods such as sauce, chili, salsa, and pizza.  Fried and fatty foods.  Eat small, frequent meals instead of large meals. SEEK IMMEDIATE MEDICAL CARE IF:   You have black or dark red stools.  You vomit blood or material that looks like coffee grounds.  You are unable to keep fluids down.  Your abdominal pain gets worse.  You have a fever.  You do not feel better after 1 week.  You have any other questions or concerns. MAKE SURE YOU:  Understand these instructions.  Will watch your condition.  Will get help right away if you are not doing well or get worse. This information is not intended to replace advice given to you by your health care provider. Make sure you discuss any questions you have with your health care provider. Document Released: 01/02/2001 Document Revised: 07/10/2011 Document Reviewed:  10/02/2014 Elsevier Interactive Patient Education  2017 [employer].

## 2016-02-23 LAB — HM MAMMOGRAPHY

## 2016-03-05 ENCOUNTER — Encounter: Payer: Self-pay | Admitting: Family Medicine

## 2016-03-05 ENCOUNTER — Ambulatory Visit (INDEPENDENT_AMBULATORY_CARE_PROVIDER_SITE_OTHER): Payer: PRIVATE HEALTH INSURANCE | Admitting: Family Medicine

## 2016-03-05 VITALS — BP 93/57 | HR 88 | Ht 62.0 in | Wt 132.0 lb

## 2016-03-05 DIAGNOSIS — K2900 Acute gastritis without bleeding: Secondary | ICD-10-CM | POA: Diagnosis not present

## 2016-03-05 DIAGNOSIS — R51 Headache: Secondary | ICD-10-CM

## 2016-03-05 DIAGNOSIS — F5104 Psychophysiologic insomnia: Secondary | ICD-10-CM

## 2016-03-05 DIAGNOSIS — R519 Headache, unspecified: Secondary | ICD-10-CM

## 2016-03-05 MED ORDER — ZOLPIDEM TARTRATE 10 MG PO TABS: 10.0000 mg | ORAL_TABLET | Freq: Every day | ORAL | 4 refills | Status: DC

## 2016-03-05 NOTE — Progress Notes (Signed)
Subjective:    Patient ID: Carly [name], female    DOB: 13-Feb-1970, 46 y.o.   MRN: [id number]  HPI 8-year-old female is here today to follow-up for gastritis. She was actually seen acutely by one of our providers about 2 weeks ago after she had gone to the emergency department the weekend prior for centralized chest pain radiating into her left arm. She had previous limited taken omeprazole and) out about a week before her symptoms started. She restarted the omeprazole BID and has noticed improvement in her symptoms. He also added Carafate twice a day.  She had also been taking Narpoxen.    Chronic insomnia - Needs refill on Ambien today. Doing well on the med with no S.S   Also c/o of chronic daily HA. Takes Excedrin almost every day since she was in college. Says used to have bad migraines but says hasn't had to use imitrex in a long time.  Just gets daily tension headaches. About a year or more ago she actually had a head injury and had a postconcussive syndrome. At that time she did try nortriptyline and says it really wasn't very effective..   Review of Systems  BP (!) 93/57   Pulse 88   Ht 5\' 2"  (1.575 m)   Wt 132 lb (59.9 kg)   SpO2 100%   BMI 24.14 kg/m     Allergies  Allergen Reactions  . Cymbalta [Duloxetine Hcl] Other (See Comments)    Jolting/jerking sensations.   . Sulfonamide Derivatives Hives  . Tape Rash    Use paper tape    Past Medical History:  Diagnosis Date  . Arthritis   . Hypertension   . PCOS (polycystic ovarian syndrome)   . Thyroid disease   . TIA (transient ischemic attack)     Past Surgical History:  Procedure Laterality Date  . 2 ectopic pregnancies  [id number]  . lymph nodes    . melanoma removal     RT SIDE  . removal of fallopian tube  1999    Social History   Social History  . Marital status: Married    Spouse name: Herbie Baltimore  . Number of children: 3  . Years of education: N/A   Occupational History  . 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 Smoking status: Never Smoker  . Smokeless tobacco: Never Used  . Alcohol use No  . Drug use: No  . Sexual activity: Yes    Partners: Male     Comment: adopted   Other Topics Concern  . Not on file   Social History Narrative   Caffeine daily. No regular exercise.  She was adopted.     Family History  Problem Relation Age of Onset  . Melanoma Mother   . Depression Mother   . Hyperlipidemia Mother   . Colon cancer Maternal Grandmother   . Colon cancer Father 24    deceased.     Outpatient Encounter Prescriptions as of 03/05/2016  Medication Sig  . albuterol (PROAIR HFA) 108 (90 Base) MCG/ACT inhaler Inhale into the lungs.  . beclomethasone (QVAR) 80 MCG/ACT inhaler Inhale into the lungs.  . fluticasone (FLONASE) 50 MCG/ACT nasal spray Place 2 sprays into the nose daily as needed. For seasonal allergy nasal congestion  . ipratropium (ATROVENT) 0.03 % nasal spray Place 2 sprays into both nostrils 4 (four) times daily as needed for rhinitis.  Marland Kitchen levonorgestrel (MIRENA)  20 MCG/24HR IUD 1 Intra Uterine Device (1 each total) by Intrauterine route once. Inserted 10/19/14  . losartan-hydrochlorothiazide (HYZAAR) 50-12.5 MG tablet TAKE 1 TABLET BY MOUTH DAILY.  . metFORMIN (GLUCOPHAGE) 1000 MG tablet TAKE 1 TABLET BY MOUTH TWO TIMES DAILY WITH MEALS  . naproxen (NAPROSYN) 500 MG tablet Take 1 tablet (500 mg total) by mouth 2 (two) times daily with a meal.  . nitrofurantoin, macrocrystal-monohydrate, (MACROBID) 100 MG capsule Take one tablet after intercourse to prevent UTI's.  . omeprazole (PRILOSEC) 40 MG capsule Take 1 capsule (40 mg total) by mouth 2 (two) times daily.  Marland Kitchen spironolactone (ALDACTONE) 25 MG tablet TAKE 1 TABLET BY MOUTH DAILY.  Marland Kitchen sucralfate (CARAFATE) 1 g tablet Take by mouth.  . SYNTHROID 125 MCG tablet Take 1 tablet (125 mcg total) by mouth daily before breakfast. Needs TSH before more refills.  Marland Kitchen  zolpidem (AMBIEN) 10 MG tablet Take 1 tablet (10 mg total) by mouth at bedtime.  . [DISCONTINUED] zolpidem (AMBIEN) 10 MG tablet Take 1 tablet (10 mg total) by mouth at bedtime.   No facility-administered encounter medications on file as of 03/05/2016.          Objective:   Physical Exam  Constitutional: She is oriented to person, place, and time. She appears well-developed and well-nourished.  HENT:  Head: Normocephalic and atraumatic.  Cardiovascular: Normal rate, regular rhythm and normal heart sounds.   Pulmonary/Chest: Effort normal and breath sounds normal.  Neurological: She is alert and oriented to person, place, and time.  Skin: Skin is warm and dry.  Psychiatric: She has a normal mood and affect. Her behavior is normal.       Assessment & Plan:  Gastritis - She is actually doing much better and her symptoms under better control. For now we'll hold off on GI referral. Continue PPIs with twice a day dosing for 6 weeks and at that point if she is doing well go down to once a day. If she continues to have breakthrough symptoms after going down to once a day and making some dietary changes then consider GI referral for endoscopy. Her last endoscopy was about 5 years ago and she was diagnosed with gastritis at that time. We also discussed chronic NSAID use including her Excedrin Migraine and her naproxen increase her risk of GI ulcers and irritation and to really work on trying to wean those back.  Chronic insomnia - Refilled her Ambien today.  Chronic daily HA - discussed possibly a trial Imitrex normal at her to lean at bedtime to help control chronic daily headache instead of relying on NSAIDs. She said she tried to cut back on her Excedrin over the last couple weeks. Even just doing that may help as she likely is getting a rebound phenomenon at this point.

## 2016-04-23 ENCOUNTER — Other Ambulatory Visit: Payer: Self-pay | Admitting: Family Medicine

## 2016-05-09 ENCOUNTER — Encounter: Payer: Self-pay | Admitting: Sports Medicine

## 2016-05-09 ENCOUNTER — Ambulatory Visit (INDEPENDENT_AMBULATORY_CARE_PROVIDER_SITE_OTHER): Payer: PRIVATE HEALTH INSURANCE | Admitting: Sports Medicine

## 2016-05-09 DIAGNOSIS — M130 Polyarthritis, unspecified: Secondary | ICD-10-CM

## 2016-05-09 NOTE — Assessment & Plan Note (Signed)
4 and a half month response to previous injection, repeat left third PIP injection today.

## 2016-05-09 NOTE — Progress Notes (Signed)
  Subjective:    CC: Finger pain  HPI: For now half months ago I injected this very pleasant 46 year old females left third proximal interphalangeal joint. She had a good response until now, no having recurrence of pain, moderate, persistent, localized without radiation. Desires repeat injection today.  Past medical history:  Negative.  See flowsheet/record as well for more information.  Surgical history: Negative.  See flowsheet/record as well for more information.  Family history: Negative.  See flowsheet/record as well for more information.  Social history: Negative.  See flowsheet/record as well for more information.  Allergies, and medications have been entered into the medical record, reviewed, and no changes needed.   Review of Systems: No fevers, chills, night sweats, weight loss, chest pain, or shortness of breath.   Objective:    General: Well Developed, well nourished, and in no acute distress.  Neuro: Alert and oriented x3, extra-ocular muscles intact, sensation grossly intact.  HEENT: Normocephalic, atraumatic, pupils equal round reactive to light, neck supple, no masses, no lymphadenopathy, thyroid nonpalpable.  Skin: Warm and dry, no rashes. Cardiac: Regular rate and rhythm, no murmurs rubs or gallops, no lower extremity edema.  Respiratory: Clear to auscultation bilaterally. Not using accessory muscles, speaking in full sentences. Left hand: Swollen, painful left third PIP  Procedure: Real-time Ultrasound Guided Injection of left third PIP Device: GE Logiq E  Verbal informed consent obtained.  Time-out conducted.  Noted no overlying erythema, induration, or other signs of local infection.  Skin prepped in a sterile fashion.  Local anesthesia: Topical Ethyl chloride.  With sterile technique and under real time ultrasound guidance:  1/2 mL kenalog 40, 1/2 mL lidocaine injected easily Completed without difficulty  Pain immediately resolved suggesting accurate placement  of the medication.  Advised to call if fevers/chills, erythema, induration, drainage, or persistent bleeding.  Images permanently stored and available for review in the ultrasound unit.  Impression: Technically successful ultrasound guided injection.  Impression and Recommendations:    Polyarthritis 4 and a half month response to previous injection, repeat left third PIP injection today.

## 2016-05-19 ENCOUNTER — Other Ambulatory Visit: Payer: Self-pay | Admitting: Sports Medicine

## 2016-05-19 ENCOUNTER — Other Ambulatory Visit: Payer: Self-pay | Admitting: Family Medicine

## 2016-05-19 DIAGNOSIS — M130 Polyarthritis, unspecified: Secondary | ICD-10-CM

## 2016-05-27 ENCOUNTER — Other Ambulatory Visit: Payer: Self-pay | Admitting: Family Medicine

## 2016-06-22 ENCOUNTER — Ambulatory Visit: Payer: Self-pay | Admitting: Sports Medicine

## 2016-08-02 ENCOUNTER — Other Ambulatory Visit: Payer: Self-pay

## 2016-08-02 MED ORDER — ZOLPIDEM TARTRATE 10 MG PO TABS
10.0000 mg | ORAL_TABLET | Freq: Every day | ORAL | 0 refills | Status: DC
Start: 2016-08-02 — End: 2016-08-31

## 2016-08-20 ENCOUNTER — Other Ambulatory Visit: Payer: Self-pay

## 2016-08-20 MED ORDER — LOSARTAN POTASSIUM-HCTZ 50-12.5 MG PO TABS
1.0000 | ORAL_TABLET | Freq: Every day | ORAL | 0 refills | Status: DC
Start: 2016-08-20 — End: 2016-09-11

## 2016-08-27 ENCOUNTER — Other Ambulatory Visit: Payer: Self-pay

## 2016-08-27 MED ORDER — SPIRONOLACTONE 25 MG PO TABS: 25.0000 mg | ORAL_TABLET | Freq: Every day | ORAL | 1 refills | Status: DC

## 2016-08-31 ENCOUNTER — Other Ambulatory Visit: Payer: Self-pay

## 2016-08-31 MED ORDER — ZOLPIDEM TARTRATE 10 MG PO TABS: 10.0000 mg | ORAL_TABLET | Freq: Every day | ORAL | 0 refills | Status: DC

## 2016-09-11 ENCOUNTER — Encounter: Payer: Self-pay | Admitting: Family Medicine

## 2016-09-11 ENCOUNTER — Ambulatory Visit (INDEPENDENT_AMBULATORY_CARE_PROVIDER_SITE_OTHER): Payer: Managed Care, Other (non HMO) | Admitting: Family Medicine

## 2016-09-11 VITALS — BP 109/72 | HR 80 | Wt 143.0 lb

## 2016-09-11 DIAGNOSIS — G4700 Insomnia, unspecified: Secondary | ICD-10-CM

## 2016-09-11 DIAGNOSIS — D500 Iron deficiency anemia secondary to blood loss (chronic): Secondary | ICD-10-CM | POA: Diagnosis not present

## 2016-09-11 DIAGNOSIS — E039 Hypothyroidism, unspecified: Secondary | ICD-10-CM

## 2016-09-11 DIAGNOSIS — I10 Essential (primary) hypertension: Secondary | ICD-10-CM | POA: Diagnosis not present

## 2016-09-11 LAB — CBC
HCT: 31.7 % — ABNORMAL LOW (ref 35.0–45.0)
HEMOGLOBIN: 10.4 g/dL — AB (ref 11.7–15.5)
MCH: 28.3 pg (ref 27.0–33.0)
MCHC: 32.8 g/dL (ref 32.0–36.0)
MCV: 86.1 fL (ref 80.0–100.0)
MPV: 8.7 fL (ref 7.5–12.5)
Platelets: 401 10*3/uL — ABNORMAL HIGH (ref 140–400)
RBC: 3.68 MIL/uL — ABNORMAL LOW (ref 3.80–5.10)
RDW: 14.7 % (ref 11.0–15.0)
WBC: 7.7 10*3/uL (ref 3.8–10.8)

## 2016-09-11 LAB — T3, FREE: T3 FREE: 2.5 pg/mL (ref 2.3–4.2)

## 2016-09-11 LAB — TSH: TSH: 0.35 m[IU]/L — AB

## 2016-09-11 LAB — T4, FREE: FREE T4: 1.5 ng/dL (ref 0.8–1.8)

## 2016-09-11 MED ORDER — SYNTHROID 125 MCG PO TABS: 125.0000 ug | ORAL_TABLET | Freq: Every day | ORAL | 3 refills | Status: DC

## 2016-09-11 MED ORDER — LOSARTAN POTASSIUM-HCTZ 50-12.5 MG PO TABS: 1.0000 | ORAL_TABLET | Freq: Every day | ORAL | 1 refills | Status: DC

## 2016-09-11 MED ORDER — ZOLPIDEM TARTRATE 10 MG PO TABS: 10.0000 mg | ORAL_TABLET | Freq: Every day | ORAL | 5 refills | Status: DC

## 2016-09-11 NOTE — Progress Notes (Signed)
Carly [name] is a 46 y.o. female who presents to [facility]: [place] today for follow-up hypertension, hypothyroidism, and insomnia.     Patient typically sees Dr. Charise Carwin for her medical problems. She has been extremely busy recently and has been unable to get in. 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  Hypertension: Patient takes losartan/hydrochlorothiazide listed below. She notes this medication works well for her with no chest pain palpitations or shortness of breath.    Hypothyroidism: Patient takes levothyroxine daily. She ran out of medicines yesterday. She denies feeling too hot or too cold.  Insomnia: Patient takes Ambien daily. She's tried multiple other medications previously and has had not much results with them. She notes this allows her to function and sleep normally. She denies feeling significantly fatigued however.  She also notes a history of iron deficiency anemia. She's feeling pretty well now but thinks he slept to be checked with other labs.   Past Medical History:  Diagnosis Date  . Arthritis   . Hypertension   . PCOS (polycystic ovarian syndrome)   . Thyroid disease   . TIA (transient ischemic attack)    Past Surgical History:  Procedure Laterality Date  . 2 ectopic pregnancies  [id number]  . lymph nodes    . melanoma removal     RT SIDE  . removal of fallopian tube  1999   Social History  Substance Use Topics  . Smoking status: Never Smoker  . Smokeless tobacco: Never Used  . Alcohol use No   family history includes Colon cancer in her maternal grandmother; Colon cancer (age of onset: 50) in her father; Depression in her mother; Hyperlipidemia in her mother; Melanoma in her mother.  ROS as above:  Medications: Current Outpatient Prescriptions    Medication Sig Dispense Refill  . albuterol (PROAIR HFA) 108 (90 Base) MCG/ACT inhaler Inhale into the lungs.    . beclomethasone (QVAR) 80 MCG/ACT inhaler Inhale into the lungs.    . fluticasone (FLONASE) 50 MCG/ACT nasal spray Place 2 sprays into the nose daily as needed. For seasonal allergy nasal congestion 16 g 1  . ipratropium (ATROVENT) 0.03 % nasal spray Place 2 sprays into both nostrils 4 (four) times daily as needed for rhinitis. 30 mL 0  . levonorgestrel (MIRENA) 20 MCG/24HR IUD 1 Intra Uterine Device (1 each total) by Intrauterine route once. Inserted 10/19/14 1 each 0  . losartan-hydrochlorothiazide (HYZAAR) 50-12.5 MG tablet Take 1 tablet by mouth daily. 90 tablet 1  . metFORMIN (GLUCOPHAGE) 1000 MG tablet TAKE 1 TABLET BY MOUTH TWO TIMES DAILY WITH MEALS 180 tablet 1  . naproxen (NAPROSYN) 500 MG tablet TAKE 1 TABLET BY MOUTH TWO TIMES DAILY WITH A MEAL. 60 tablet 3  . nitrofurantoin, macrocrystal-monohydrate, (MACROBID) 100 MG capsule Take one tablet after intercourse to prevent UTI's. 30 capsule 2  . omeprazole (PRILOSEC) 40 MG capsule Take 1 capsule (40 mg total) by mouth 2 (two) times daily. 60 capsule 3  . spironolactone (ALDACTONE) 25 MG tablet Take 1 tablet (25 mg total) by mouth daily. 30 tablet 1  . sucralfate (CARAFATE) 1 g tablet Take by mouth.    . SYNTHROID 125 MCG tablet Take 1 tablet (125 mcg total) by mouth daily before breakfast. 30 tablet 3  . zolpidem (AMBIEN) 10 MG tablet Take 1 tablet (10 mg total) by mouth at bedtime.  30 tablet 5   No current facility-administered medications for this visit.    Allergies  Allergen Reactions  . Cymbalta [Duloxetine Hcl] Other (See Comments)    Jolting/jerking sensations.   . Sulfonamide Derivatives Hives  . Tape Rash    Use paper tape    Health Maintenance Health Maintenance  Topic Date Due  . HIV Screening  05/05/1984  . MAMMOGRAM  12/31/2015  . INFLUENZA VACCINE  08/22/2016  . PAP SMEAR  05/20/2017  .  TETANUS/TDAP  01/23/2019  . COLONOSCOPY  08/24/2019     Exam:  BP 109/72   Pulse 80   Wt 143 lb (64.9 kg)   BMI 26.16 kg/m  Gen: Well NAD HEENT: EOMI,  MMM No goiter Lungs: Normal work of breathing. CTABL Heart: RRR no MRG Abd: NABS, Soft. Nondistended, Nontender Exts: Brisk capillary refill, warm and well perfused.      Assessment and Plan: 46 y.o. female with  Hypertension and goal. Check metabolic panel and continue olmesartan/hydrochlorothiazide. Follow-up with PCP in a few months.      Hypothyroidism: We'll check thyroid labs listed below and follow-up with PCP. I prescribed her short-term amount of levothyroxine now.  Insomnia: Refill Ambien. Continued watchful waiting for adverse effects of this medication.  Iron deficiency anemia: We'll recheck ferritin iron binding capacity and CBC.  Influenza vaccine declined today. Patient notes that she typically gets the influenza vaccine at work.  Orders Placed This Encounter  Procedures  . CBC  . COMPLETE METABOLIC PANEL WITH GFR  . T4, free  . T3, free  . TSH  . Iron and TIBC  . Ferritin   Meds ordered this encounter  Medications  . losartan-hydrochlorothiazide (HYZAAR) 50-12.5 MG tablet    Sig: Take 1 tablet by mouth daily.    Dispense:  90 tablet    Refill:  1  . SYNTHROID 125 MCG tablet    Sig: Take 1 tablet (125 mcg total) by mouth daily before breakfast.    Dispense:  30 tablet    Refill:  3  . zolpidem (AMBIEN) 10 MG tablet    Sig: Take 1 tablet (10 mg total) by mouth at bedtime.    Dispense:  30 tablet    Refill:  5     Discussed warning signs or symptoms. Please see discharge instructions. Patient expresses understanding.  Of note Mammogram done in 02/23/2016 will update health maintenance.

## 2016-09-11 NOTE — Patient Instructions (Signed)
Thank you for coming in today. Get labs today.  I recommend you follow up with Dr Madilyn Fireman for a well visit in about 3 months.

## 2016-09-12 ENCOUNTER — Encounter: Payer: Self-pay | Admitting: Family Medicine

## 2016-09-12 LAB — COMPLETE METABOLIC PANEL WITH GFR
ALBUMIN: 4.2 g/dL (ref 3.6–5.1)
ALK PHOS: 36 U/L (ref 33–115)
ALT: 8 U/L (ref 6–29)
AST: 12 U/L (ref 10–35)
BILIRUBIN TOTAL: 0.3 mg/dL (ref 0.2–1.2)
BUN: 22 mg/dL (ref 7–25)
CALCIUM: 9.4 mg/dL (ref 8.6–10.2)
CO2: 25 mmol/L (ref 20–32)
Chloride: 102 mmol/L (ref 98–110)
Creat: 1.06 mg/dL (ref 0.50–1.10)
GFR, EST AFRICAN AMERICAN: 72 mL/min (ref >=60)
GFR, EST NON AFRICAN AMERICAN: 63 mL/min (ref >=60)
Glucose, Bld: 91 mg/dL (ref 65–99)
POTASSIUM: 3.4 mmol/L — AB (ref 3.5–5.3)
SODIUM: 138 mmol/L (ref 135–146)
TOTAL PROTEIN: 6.7 g/dL (ref 6.1–8.1)

## 2016-09-12 LAB — FERRITIN: Ferritin: 6 ng/mL — ABNORMAL LOW (ref 10–232)

## 2016-09-12 LAB — IRON AND TIBC
%SAT: 8 % — ABNORMAL LOW (ref 11–50)
IRON: 35 ug/dL — AB (ref 40–190)
TIBC: 444 ug/dL (ref 250–450)
UIBC: 409 ug/dL

## 2016-09-13 ENCOUNTER — Ambulatory Visit (INDEPENDENT_AMBULATORY_CARE_PROVIDER_SITE_OTHER): Payer: Managed Care, Other (non HMO) | Admitting: Sports Medicine

## 2016-09-13 ENCOUNTER — Encounter: Payer: Self-pay | Admitting: Sports Medicine

## 2016-09-13 DIAGNOSIS — M130 Polyarthritis, unspecified: Secondary | ICD-10-CM | POA: Diagnosis not present

## 2016-09-13 DIAGNOSIS — M47816 Spondylosis without myelopathy or radiculopathy, lumbar region: Secondary | ICD-10-CM | POA: Diagnosis not present

## 2016-09-13 NOTE — Assessment & Plan Note (Signed)
MRI from 2015 shows diffuse degenerative disc disease from L3 down to S1, with facet arthritis at all levels. She does have however a larger broad-based protruding disc at L5-S1 with ligamenta flavum hypertrophy contributing to moderate central canal stenosis. I did give her some rehabilitation exercises that she is now due for the next 4-6 weeks, if insufficient relief we will proceed with spinal injections, I do suspect that she is going to need both epidurals and facet joint injections. Her MRI is 46 years old so we will need a new one.

## 2016-09-13 NOTE — Assessment & Plan Note (Signed)
Previous injection was over 4 months ago, repeat left third PIP injection. Return as needed.

## 2016-09-13 NOTE — Progress Notes (Signed)
  Subjective:    CC: Finger pain, back pain  HPI: Carly [name] is a pleasant 46 year old female, she has known left third PIP osteoarthritis, previous injection was 4-1/2 months ago. Now having recurrence of pain, moderate, persistent, localized without radiation, desires repeat interventional treatment today.  Low back pain: Worse with sitting, flexion, Valsalva, also feels a little better with forward flexion of her spine. MRI from 2015 did show some spondylitic processes as well as spinal stenosis. She has not been diligent with her exercises and agrees to try these before we proceed with another MRI for epidural/facet joint injection planning. No bowel or bladder dysfunction, saddle numbness, constitutional symptoms.  Past medical history:  Negative.  See flowsheet/record as well for more information.  Surgical history: Negative.  See flowsheet/record as well for more information.  Family history: Negative.  See flowsheet/record as well for more information.  Social history: Negative.  See flowsheet/record as well for more information.  Allergies, and medications have been entered into the medical record, reviewed, and no changes needed.   Review of Systems: No fevers, chills, night sweats, weight loss, chest pain, or shortness of breath.   Objective:    General: Well Developed, well nourished, and in no acute distress.  Neuro: Alert and oriented x3, extra-ocular muscles intact, sensation grossly intact.  HEENT: Normocephalic, atraumatic, pupils equal round reactive to light, neck supple, no masses, no lymphadenopathy, thyroid nonpalpable.  Skin: Warm and dry, no rashes. Cardiac: Regular rate and rhythm, no murmurs rubs or gallops, no lower extremity edema.  Respiratory: Clear to auscultation bilaterally. Not using accessory muscles, speaking in full sentences. Left hand: Visibly swollen, tender third PIP joint.  Procedure: Real-time Ultrasound Guided Injection of left third PIP Device: GE  Logiq E  Verbal informed consent obtained.  Time-out conducted.  Noted no overlying erythema, induration, or other signs of local infection.  Skin prepped in a sterile fashion.  Local anesthesia: Topical Ethyl chloride.  With sterile technique and under real time ultrasound guidance:  I injected medication into the left third PIP, used a total of 1/2 mL Kenalog 40, 1/2 mL lidocaine, some of the medication was placed outside the joint, periarticular when the joint reached its capacity. Completed without difficulty  Pain immediately resolved suggesting accurate placement of the medication.  Advised to call if fevers/chills, erythema, induration, drainage, or persistent bleeding.  Images permanently stored and available for review in the ultrasound unit.  Impression: Technically successful ultrasound guided injection.  Lumbar spine MRI from 2015 shows diffuse degenerative disc disease from L3 down to S1, with facet arthritis at all levels. She does have however a larger broad-based protruding disc at L5-S1 with ligamenta flavum hypertrophy contributing to moderate central canal stenosis.  Impression and Recommendations:    Polyarthritis with left third PIP osteoarthritis Previous injection was over 4 months ago, repeat left third PIP injection. Return as needed.  Lumbar spondylosis MRI from 2015 shows diffuse degenerative disc disease from L3 down to S1, with facet arthritis at all levels. She does have however a larger broad-based protruding disc at L5-S1 with ligamenta flavum hypertrophy contributing to moderate central canal stenosis. I did give her some rehabilitation exercises that she is now due for the next 4-6 weeks, if insufficient relief we will proceed with spinal injections, I do suspect that she is going to need both epidurals and facet joint injections. Her MRI is 46 years old so we will need a new one.

## 2016-09-13 NOTE — Addendum Note (Signed)
Addended by: Gregor Hams on: 09/13/2016 08:30 AM   Modules accepted: Orders

## 2016-09-27 ENCOUNTER — Other Ambulatory Visit: Payer: Self-pay | Admitting: *Deleted

## 2016-09-27 ENCOUNTER — Encounter: Payer: Self-pay | Admitting: Family Medicine

## 2016-09-27 MED ORDER — OMEPRAZOLE 40 MG PO CPDR: 40.0000 mg | DELAYED_RELEASE_CAPSULE | Freq: Two times a day (BID) | ORAL | 6 refills | Status: DC

## 2016-10-11 ENCOUNTER — Ambulatory Visit: Payer: Self-pay | Admitting: Sports Medicine

## 2016-10-25 ENCOUNTER — Other Ambulatory Visit: Payer: Self-pay | Admitting: Family Medicine

## 2016-10-26 ENCOUNTER — Ambulatory Visit (INDEPENDENT_AMBULATORY_CARE_PROVIDER_SITE_OTHER): Payer: Managed Care, Other (non HMO) | Admitting: Sports Medicine

## 2016-10-26 ENCOUNTER — Encounter: Payer: Self-pay | Admitting: Sports Medicine

## 2016-10-26 DIAGNOSIS — M47816 Spondylosis without myelopathy or radiculopathy, lumbar region: Secondary | ICD-10-CM

## 2016-10-26 DIAGNOSIS — M4807 Spinal stenosis, lumbosacral region: Secondary | ICD-10-CM

## 2016-10-26 MED ORDER — KETOROLAC TROMETHAMINE 30 MG/ML IJ SOLN
30.0000 mg | Freq: Once | INTRAMUSCULAR | Status: AC
  Administered 2016-10-26: 30 mg via INTRAMUSCULAR

## 2016-10-26 MED ORDER — KETOROLAC TROMETHAMINE 30 MG/ML IJ SOLN: 30.0000 mg | Freq: Once | INTRAMUSCULAR | Status: AC

## 2016-10-26 MED ORDER — PREDNISONE 50 MG PO TABS: ORAL_TABLET | ORAL | 0 refills | Status: DC

## 2016-10-26 NOTE — Addendum Note (Signed)
Addended by: Beatris Ship L on: 10/26/2016 02:27 PM   Modules accepted: Orders

## 2016-10-26 NOTE — Assessment & Plan Note (Signed)
Diffuse degenerative disc disease on an MRI from 2015 with a large broad-based L5-S1 protruding disc with ligamentum flavum hypertrophy with moderate central canal stenosis. There is also multilevel lower facet arthritis. Pain is currently discogenic, adding prednisone, 30 of Toradol, new MRI which will probably lead to an L5-S1 interlaminar epidural.

## 2016-10-26 NOTE — Progress Notes (Signed)
  Subjective:    CC: Low back pain  HPI: This is a pleasant 46 year old female, she is a long history of lumbar degenerative changes, more recently bent over to pick something up, felt a pain in her back, now has severe axial back pain, worse with sitting, flexion, Valsalva, no bowel or bladder dysfunction, saddle numbness, no constitutional symptoms. At this point she's been through months of physical therapy, steroids, NSAIDs, muscle relaxers. Her MRI is old though, from 37.  Past medical history:  Negative.  See flowsheet/record as well for more information.  Surgical history: Negative.  See flowsheet/record as well for more information.  Family history: Negative.  See flowsheet/record as well for more information.  Social history: Negative.  See flowsheet/record as well for more information.  Allergies, and medications have been entered into the medical record, reviewed, and no changes needed.   Review of Systems: No fevers, chills, night sweats, weight loss, chest pain, or shortness of breath.   Objective:    General: Well Developed, well nourished, and in no acute distress.  Neuro: Alert and oriented x3, extra-ocular muscles intact, sensation grossly intact.  HEENT: Normocephalic, atraumatic, pupils equal round reactive to light, neck supple, no masses, no lymphadenopathy, thyroid nonpalpable.  Skin: Warm and dry, no rashes. Cardiac: Regular rate and rhythm, no murmurs rubs or gallops, no lower extremity edema.  Respiratory: Clear to auscultation bilaterally. Not using accessory muscles, speaking in full sentences. Back Exam:  Inspection: Unremarkable  Motion: Flexion 45 deg, Extension 45 deg, Side Bending to 45 deg bilaterally,  Rotation to 45 deg bilaterally  SLR laying: Negative  XSLR laying: Negative  Palpable tenderness: None. FABER: negative. Sensory change: Gross sensation intact to all lumbar and sacral dermatomes.  Reflexes: 2+ at both patellar tendons, 2+ at achilles  tendons, Babinski's downgoing.  Strength at foot  Plantar-flexion: 5/5 Dorsi-flexion: 5/5 Eversion: 5/5 Inversion: 5/5  Leg strength  Quad: 5/5 Hamstring: 5/5 Hip flexor: 5/5 Hip abductors: 5/5  Gait unremarkable.  Impression and Recommendations:    Lumbar spondylosis Diffuse degenerative disc disease on an MRI from 2015 with a large broad-based L5-S1 protruding disc with ligamentum flavum hypertrophy with moderate central canal stenosis. There is also multilevel lower facet arthritis. Pain is currently discogenic, adding prednisone, 30 of Toradol, new MRI which will probably lead to an L5-S1 interlaminar epidural. ___________________________________________ Gwen Her. Dianah Field, M.D., ABFM., CAQSM. Primary Care and [place] Instructor of [place] of [facility] of Medicine

## 2016-10-27 ENCOUNTER — Ambulatory Visit (HOSPITAL_BASED_OUTPATIENT_CLINIC_OR_DEPARTMENT_OTHER)
Admission: RE | Admit: 2016-10-27 | Discharge: 2016-10-27 | Disposition: A | Discharge status: Home / Self Care | Payer: Managed Care, Other (non HMO) | Source: Ambulatory Visit | Attending: Sports Medicine | Admitting: Sports Medicine

## 2016-10-27 DIAGNOSIS — M4807 Spinal stenosis, lumbosacral region: Secondary | ICD-10-CM | POA: Insufficient documentation

## 2016-10-27 DIAGNOSIS — M47816 Spondylosis without myelopathy or radiculopathy, lumbar region: Secondary | ICD-10-CM | POA: Diagnosis present

## 2016-10-27 DIAGNOSIS — D509 Iron deficiency anemia, unspecified: Secondary | ICD-10-CM | POA: Diagnosis not present

## 2016-10-27 DIAGNOSIS — M5126 Other intervertebral disc displacement, lumbar region: Secondary | ICD-10-CM | POA: Diagnosis not present

## 2016-10-28 ENCOUNTER — Encounter: Payer: Self-pay | Admitting: Sports Medicine

## 2016-10-29 ENCOUNTER — Encounter: Payer: Self-pay | Admitting: Sports Medicine

## 2016-10-29 ENCOUNTER — Ambulatory Visit (INDEPENDENT_AMBULATORY_CARE_PROVIDER_SITE_OTHER): Payer: Managed Care, Other (non HMO) | Admitting: Sports Medicine

## 2016-10-29 DIAGNOSIS — M47816 Spondylosis without myelopathy or radiculopathy, lumbar region: Secondary | ICD-10-CM

## 2016-10-29 MED ORDER — GABAPENTIN 300 MG PO CAPS: ORAL_CAPSULE | ORAL | 3 refills | Status: DC

## 2016-10-29 MED ORDER — HYDROCODONE-ACETAMINOPHEN 5-325 MG PO TABS: 1.0000 | ORAL_TABLET | Freq: Three times a day (TID) | ORAL | 0 refills | Status: DC | PRN

## 2016-10-29 NOTE — Assessment & Plan Note (Addendum)
MRI shows moderate central canal stenosis at L4-L5, lesser so L5-S1 with bilateral facet arthritis. At this point has not responded to steroids, conservative measures we are going to proceed with a right L4-L5 interlaminar epidural on an urgent basis. Small hydrocodone, gabapentin given in the meantime.

## 2016-10-29 NOTE — Progress Notes (Signed)
  Subjective:    CC: MRI results  HPI: This is a pleasant 46 year old female, she has known lumbar degenerative disc disease, she bent forward a couple of weeks ago to pick something up, felt a severe pain in her back, localized without radiation past the knee on the right. We did prednisone, rehabilitation exercises which has only improved her symptoms to a slight degree. No bowel or bladder dysfunction, saddle numbness, constitutional symptoms, pain is severe and she is requesting referral for epidural.  Past medical history:  Negative.  See flowsheet/record as well for more information.  Surgical history: Negative.  See flowsheet/record as well for more information.  Family history: Negative.  See flowsheet/record as well for more information.  Social history: Negative.  See flowsheet/record as well for more information.  Allergies, and medications have been entered into the medical record, reviewed, and no changes needed.   Review of Systems: No fevers, chills, night sweats, weight loss, chest pain, or shortness of breath.   Objective:    General: Well Developed, well nourished, and in no acute distress.  Neuro: Alert and oriented x3, extra-ocular muscles intact, sensation grossly intact.  HEENT: Normocephalic, atraumatic, pupils equal round reactive to light, neck supple, no masses, no lymphadenopathy, thyroid nonpalpable.  Skin: Warm and dry, no rashes. Cardiac: Regular rate and rhythm, no murmurs rubs or gallops, no lower extremity edema.  Respiratory: Clear to auscultation bilaterally. Not using accessory muscles, speaking in full sentences.  MRI reviewed and shows multilevel degenerative disc disease worse at the L4-L5 level with desiccation, and a broad-based protrusion with some degree of central canal stenosis related to disc protrusion, facet hypertrophy as well as ligament and playful hypertrophy, she has a similar central disc protrusion at the L5-S1 level with some ligamentum  flavum hypertrophy causing mild central canal stenosis. There is also facet arthropathy at this level.  Impression and Recommendations:    Lumbar spondylosis MRI shows moderate central canal stenosis at L4-L5, lesser so L5-S1 with bilateral facet arthritis. At this point has not responded to steroids, conservative measures we are going to proceed with a right L4-L5 interlaminar epidural on an urgent basis. Small hydrocodone, gabapentin given in the meantime.  I spent 25 minutes with this patient, greater than 50% was face-to-face time counseling regarding the above diagnoses ___________________________________________ Gwen Her. Dianah Field, M.D., ABFM., CAQSM. Primary Care and [place] Instructor of [place] of [facility] of Medicine

## 2016-10-30 ENCOUNTER — Other Ambulatory Visit: Payer: Self-pay | Admitting: Sports Medicine

## 2016-10-30 ENCOUNTER — Ambulatory Visit
Admission: RE | Admit: 2016-10-30 | Discharge: 2016-10-30 | Disposition: A | Discharge status: Home / Self Care | Payer: No Typology Code available for payment source | Source: Ambulatory Visit | Attending: Sports Medicine | Admitting: Sports Medicine

## 2016-10-30 ENCOUNTER — Other Ambulatory Visit: Payer: Self-pay | Admitting: Family Medicine

## 2016-10-30 DIAGNOSIS — R1010 Upper abdominal pain, unspecified: Secondary | ICD-10-CM

## 2016-10-30 MED ORDER — IOPAMIDOL (ISOVUE-M 200) INJECTION 41%
1.0000 mL | Freq: Once | INTRAMUSCULAR | Status: AC
  Administered 2016-10-30: 1 mL via EPIDURAL

## 2016-10-30 MED ORDER — METHYLPREDNISOLONE ACETATE 40 MG/ML INJ SUSP (RADIOLOG
120.0000 mg | Freq: Once | INTRAMUSCULAR | Status: AC
  Administered 2016-10-30: 120 mg via EPIDURAL

## 2016-10-30 NOTE — Discharge Instructions (Signed)

## 2016-11-07 ENCOUNTER — Other Ambulatory Visit: Payer: Self-pay

## 2016-11-12 ENCOUNTER — Encounter: Payer: Self-pay | Admitting: Family Medicine

## 2016-11-12 DIAGNOSIS — E039 Hypothyroidism, unspecified: Secondary | ICD-10-CM

## 2016-11-13 ENCOUNTER — Encounter: Payer: Self-pay | Admitting: Sports Medicine

## 2016-11-15 ENCOUNTER — Encounter: Payer: Self-pay | Admitting: Family Medicine

## 2016-11-15 ENCOUNTER — Other Ambulatory Visit: Payer: Self-pay | Admitting: *Deleted

## 2016-11-15 LAB — TSH: TSH: 0.26 mIU/L — ABNORMAL LOW

## 2016-11-16 MED ORDER — SYNTHROID 125 MCG PO TABS: 125.0000 ug | ORAL_TABLET | Freq: Every day | ORAL | 3 refills | Status: DC

## 2016-11-16 NOTE — Addendum Note (Signed)
Addended by: Huel Cote on: 11/16/2016 09:43 AM   Modules accepted: Orders

## 2016-12-07 ENCOUNTER — Ambulatory Visit (INDEPENDENT_AMBULATORY_CARE_PROVIDER_SITE_OTHER): Payer: BLUE CROSS/BLUE SHIELD | Admitting: Sports Medicine

## 2016-12-07 DIAGNOSIS — M130 Polyarthritis, unspecified: Secondary | ICD-10-CM

## 2016-12-07 DIAGNOSIS — M47816 Spondylosis without myelopathy or radiculopathy, lumbar region: Secondary | ICD-10-CM

## 2016-12-07 NOTE — Progress Notes (Addendum)
  Subjective:    CC: Hand osteoarthritis  HPI: This is a pleasant 46 year old female, she has known left third PIP osteoarthritis, we have done occasional injections, the last one provided 4 months of relief, the most recent only 2 months and 3 weeks.  She is having recurrence of pain and swelling, moderate, persistent, localized at the PIP, desires repeat interventional treatment today.  Past medical history:  Negative.  See flowsheet/record as well for more information.  Surgical history: Negative.  See flowsheet/record as well for more information.  Family history: Negative.  See flowsheet/record as well for more information.  Social history: Negative.  See flowsheet/record as well for more information.  Allergies, and medications have been entered into the medical record, reviewed, and no changes needed.   Review of Systems: No fevers, chills, night sweats, weight loss, chest pain, or shortness of breath.   Objective:    General: Well Developed, well nourished, and in no acute distress.  Neuro: Alert and oriented x3, extra-ocular muscles intact, sensation grossly intact.  HEENT: Normocephalic, atraumatic, pupils equal round reactive to light, neck supple, no masses, no lymphadenopathy, thyroid nonpalpable.  Skin: Warm and dry, no rashes. Cardiac: Regular rate and rhythm, no murmurs rubs or gallops, no lower extremity edema.  Respiratory: Clear to auscultation bilaterally. Not using accessory muscles, speaking in full sentences. Left hand: Swollen, tender third PIP.  Procedure: Real-time Ultrasound Guided Injection of left third PIP Device: GE Logiq E  Verbal informed consent obtained.  Time-out conducted.  Noted no overlying erythema, induration, or other signs of local infection.  Skin prepped in a sterile fashion.  Local anesthesia: Topical Ethyl chloride.  With sterile technique and under real time ultrasound guidance: 1/2 cc kenalog 40, 1/2 cc lidocaine injected  easily Completed without difficulty  Pain immediately resolved suggesting accurate placement of the medication.  Advised to call if fevers/chills, erythema, induration, drainage, or persistent bleeding.  Images permanently stored and available for review in the ultrasound unit.  Impression: Technically successful ultrasound guided injection.  Impression and Recommendations:    Polyarthritis with left third PIP osteoarthritis 2 months and 3 weeks post last injection, a bit early but the weather is been terrible over the past several days, having an increase in pain and swelling at the left third PIP. Injection as above, return as needed. Continue Plaquenil.  Lumbar spondylosis Central canal stenosis at L4-L5, bilateral facet arthritis. Did well with a right L4-L5 interlaminar epidural, ordering epidural #2 of the series.  ___________________________________________ Gwen Her. Dianah Field, M.D., ABFM., CAQSM. Primary Care and [place] Instructor of [place] of [facility] of Medicine

## 2016-12-07 NOTE — Assessment & Plan Note (Signed)
Central canal stenosis at L4-L5, bilateral facet arthritis. Did well with a right L4-L5 interlaminar epidural, ordering epidural #2 of the series.

## 2016-12-07 NOTE — Addendum Note (Signed)
Addended by: Silverio Decamp on: 12/07/2016 09:51 AM   Modules accepted: Orders

## 2016-12-07 NOTE — Assessment & Plan Note (Signed)
2 months and 3 weeks post last injection, a bit early but the weather is been terrible over the past several days, having an increase in pain and swelling at the left third PIP. Injection as above, return as needed. Continue Plaquenil.

## 2016-12-12 DIAGNOSIS — K3189 Other diseases of stomach and duodenum: Secondary | ICD-10-CM | POA: Diagnosis not present

## 2016-12-12 DIAGNOSIS — D509 Iron deficiency anemia, unspecified: Secondary | ICD-10-CM | POA: Diagnosis not present

## 2016-12-12 LAB — HM COLONOSCOPY

## 2016-12-19 ENCOUNTER — Encounter: Payer: Self-pay | Admitting: Family Medicine

## 2017-01-06 ENCOUNTER — Other Ambulatory Visit: Payer: Self-pay | Admitting: Family Medicine

## 2017-01-07 ENCOUNTER — Encounter: Payer: Self-pay | Admitting: *Deleted

## 2017-01-18 NOTE — Telephone Encounter (Signed)
Error

## 2017-02-06 ENCOUNTER — Other Ambulatory Visit: Payer: Self-pay | Admitting: *Deleted

## 2017-02-06 MED ORDER — SPIRONOLACTONE 25 MG PO TABS: ORAL_TABLET | ORAL | 0 refills | Status: DC

## 2017-02-26 ENCOUNTER — Other Ambulatory Visit: Payer: Self-pay

## 2017-02-26 MED ORDER — LOSARTAN POTASSIUM-HCTZ 50-12.5 MG PO TABS: 1.0000 | ORAL_TABLET | Freq: Every day | ORAL | 0 refills | Status: DC

## 2017-03-16 ENCOUNTER — Other Ambulatory Visit: Payer: Self-pay | Admitting: Family Medicine

## 2017-03-18 ENCOUNTER — Other Ambulatory Visit: Payer: Self-pay | Admitting: *Deleted

## 2017-03-18 MED ORDER — SPIRONOLACTONE 25 MG PO TABS: ORAL_TABLET | ORAL | 0 refills | Status: DC

## 2017-03-18 MED ORDER — METFORMIN HCL 1000 MG PO TABS: ORAL_TABLET | ORAL | 0 refills | Status: DC

## 2017-04-01 DIAGNOSIS — E538 Deficiency of other specified B group vitamins: Secondary | ICD-10-CM | POA: Diagnosis not present

## 2017-04-01 DIAGNOSIS — D509 Iron deficiency anemia, unspecified: Secondary | ICD-10-CM | POA: Diagnosis not present

## 2017-04-04 DIAGNOSIS — D509 Iron deficiency anemia, unspecified: Secondary | ICD-10-CM | POA: Diagnosis not present

## 2017-04-04 DIAGNOSIS — E538 Deficiency of other specified B group vitamins: Secondary | ICD-10-CM | POA: Diagnosis not present

## 2017-04-12 ENCOUNTER — Encounter: Payer: Self-pay | Admitting: Family Medicine

## 2017-04-12 ENCOUNTER — Ambulatory Visit (INDEPENDENT_AMBULATORY_CARE_PROVIDER_SITE_OTHER): Payer: BLUE CROSS/BLUE SHIELD | Admitting: Family Medicine

## 2017-04-12 VITALS — BP 124/68 | HR 80 | Ht 64.0 in | Wt 142.0 lb

## 2017-04-12 DIAGNOSIS — H1013 Acute atopic conjunctivitis, bilateral: Secondary | ICD-10-CM

## 2017-04-12 DIAGNOSIS — I10 Essential (primary) hypertension: Secondary | ICD-10-CM | POA: Diagnosis not present

## 2017-04-12 DIAGNOSIS — E039 Hypothyroidism, unspecified: Secondary | ICD-10-CM | POA: Diagnosis not present

## 2017-04-12 DIAGNOSIS — J301 Allergic rhinitis due to pollen: Secondary | ICD-10-CM | POA: Diagnosis not present

## 2017-04-12 MED ORDER — SPIRONOLACTONE 25 MG PO TABS: ORAL_TABLET | ORAL | 3 refills | Status: DC

## 2017-04-12 MED ORDER — OLOPATADINE HCL 0.2 % OP SOLN: 1.0000 [drp] | Freq: Every day | OPHTHALMIC | 3 refills | Status: DC

## 2017-04-12 NOTE — Progress Notes (Signed)
Subjective:    Patient ID: Carly [name], female    DOB: 1970-03-16, 47 y.o.   MRN: [id number]  HPI  Hypertension- Pt denies chest pain, SOB, dizziness, or heart palpitations.  Taking meds as directed w/o problems.  Denies medication side effects.  She said when she was at the hematologist her blood pressure was actually low.  Hypothyroidism -TSH was a little low at 0.26.  Based on the last TSH I had recommended that she take a half a tab 1 day a week and a whole tab the other 6 days a week.  Currently takes 125 mcg daily.  She does report some hair loss but says this is not new.  No skin changes or significant changes in weight.  She reports taking her medication regularly.  PCOS - on metformin and spironolactone.  Doing well on the medication without any side effects or diarrhea.  She also has not felt well for about the last 3 days.  She typically gets a lot of sinus pressure and congestion this time a year with spring allergies.  She has been doing her nasal Flonase and took some oral Sudafed today.  She is having a lot of pain and particularly in her left ear.  She is also had a dry cough.  Fevers chills.  Review of Systems  BP 124/68   Pulse 80   Ht 5\' 4"  (1.626 m)   Wt 142 lb (64.4 kg)   SpO2 100%   BMI 24.37 kg/m     Allergies  Allergen Reactions  . Cymbalta [Duloxetine Hcl] Other (See Comments)    Jolting/jerking sensations.   . Sulfonamide Derivatives Hives  . Tape Rash    Use paper tape    Past Medical History:  Diagnosis Date  . Arthritis   . Hypertension   . PCOS (polycystic ovarian syndrome)   . Thyroid disease   . TIA (transient ischemic attack)     Past Surgical History:  Procedure Laterality Date  . 2 ectopic pregnancies  [id number]  . lymph nodes    . melanoma removal     RT SIDE  . removal of fallopian tube  1999    Social History   Socioeconomic History  . Marital status: Married    Spouse name: Herbie Baltimore  . Number of children: 3  . Years  of education: Not on file  . Highest education level: Not on file  Occupational History  . Occupation: Education officer, museum [employer]    Employer: [place]: [employer].   Social Needs  . Financial resource strain: Not on file  . Food insecurity:    Worry: Not on file    Inability: Not on file  . Transportation needs:    Medical: Not on file    Non-medical: Not on file  Tobacco Use  . Smoking status: Never Smoker  . Smokeless tobacco: Never Used  Substance and Sexual Activity  . Alcohol use: No  . Drug use: No  . Sexual activity: Yes    Partners: Male    Comment: adopted  Lifestyle  . Physical activity:    Days per week: Not on file    Minutes per session: Not on file  . Stress: Not on file  Relationships  . Social connections:    Talks on phone: Not on file    Gets together: Not on file    Attends religious service: Not on file    Active member of club  or organization: Not on file    Attends meetings of clubs or organizations: Not on file    Relationship status: Not on file  . Intimate partner violence:    Fear of current or ex partner: Not on file    Emotionally abused: Not on file    Physically abused: Not on file    Forced sexual activity: Not on file  Other Topics Concern  . Not on file  Social History Narrative   Caffeine daily. No regular exercise.  She was adopted.     Family History  Problem Relation Age of Onset  . Melanoma Mother   . Depression Mother   . Hyperlipidemia Mother   . Colon cancer Maternal Grandmother   . Colon cancer Father 48       deceased.     Outpatient Encounter Medications as of 04/12/2017  Medication Sig  . fluticasone (FLONASE) 50 MCG/ACT nasal spray Place 2 sprays into the nose daily as needed. For seasonal allergy nasal congestion  . levonorgestrel (MIRENA) 20 MCG/24HR IUD 1 Intra Uterine Device (1 each total) by Intrauterine route once. Inserted 10/19/14  . losartan-hydrochlorothiazide (HYZAAR) 50-12.5 MG tablet  Take 1 tablet by mouth daily. Due for follow up  . metFORMIN (GLUCOPHAGE) 1000 MG tablet TAKE 1 TABLET BY MOUTH TWO TIMES DAILY WITH MEALS  . omeprazole (PRILOSEC) 40 MG capsule Take 1 capsule (40 mg total) by mouth 2 (two) times daily.  [place] spironolactone (ALDACTONE) 25 MG tablet TAKE 1 TABLET BY MOUTH ONCE DAILY  . SYNTHROID 125 MCG tablet Take 1 tablet (125 mcg total) by mouth daily before breakfast.  . zolpidem (AMBIEN) 10 MG tablet Take 1 tablet (10 mg total) by mouth at bedtime.  . [DISCONTINUED] spironolactone (ALDACTONE) 25 MG tablet TAKE 1 TABLET BY MOUTH ONCE DAILY, DUE FOR FOLLOW UP VISIT WITH PCP  . Olopatadine HCl 0.2 % SOLN Apply 1 drop to eye daily.  . [DISCONTINUED] albuterol (PROAIR HFA) 108 (90 Base) MCG/ACT inhaler Inhale into the lungs.  . [DISCONTINUED] beclomethasone (QVAR) 80 MCG/ACT inhaler Inhale into the lungs.  . [DISCONTINUED] gabapentin (NEURONTIN) 300 MG capsule 1 tab by mouth daily at bedtime for a week then twice a day for a week then 3 times a day.  . [DISCONTINUED] ipratropium (ATROVENT) 0.03 % nasal spray Place 2 sprays into both nostrils 4 (four) times daily as needed for rhinitis.  . [DISCONTINUED] naproxen (NAPROSYN) 500 MG tablet TAKE 1 TABLET BY MOUTH TWO TIMES DAILY WITH A MEAL.  . [DISCONTINUED] sucralfate (CARAFATE) 1 g tablet Take by mouth.   No facility-administered encounter medications on file as of 04/12/2017.          Objective:   Physical Exam  Constitutional: She is oriented to person, place, and time. She appears well-developed and well-nourished.  HENT:  Head: Normocephalic and atraumatic.  Right Ear: External ear normal.  Left Ear: External ear normal.  Nose: Nose normal.  Mouth/Throat: Oropharynx is clear and moist.  TMs and canals are clear.   Eyes: Pupils are equal, round, and reactive to light. Conjunctivae and EOM are normal.  Neck: Neck supple. No thyromegaly present.  Cardiovascular: Normal rate, regular rhythm and normal  heart sounds.  Pulmonary/Chest: Effort normal and breath sounds normal. She has no wheezes.  Lymphadenopathy:    She has no cervical adenopathy.  Neurological: She is alert and oriented to person, place, and time.  Skin: Skin is warm and dry.  Psychiatric: She has a normal mood and affect.  Her behavior is normal.       Assessment & Plan:  HTN -pressure looks fantastic today but did encourage her to try checking it at home occasionally to see if she is getting some lower blood pressures at home.  Hypothyroidism - due to recheck TSH.  Will adjust dose if needed.  Sinus congestion pressure and left ear pain-most likely related to her allergies.  Continue Flonase.  But if not better after the weekend or she feels like she is getting worse than please call back and we will treat for acute sinus infection.  Allergic eye-we will treat with [place] drops.

## 2017-04-15 MED ORDER — ZOLPIDEM TARTRATE 10 MG PO TABS: 10.0000 mg | ORAL_TABLET | Freq: Every day | ORAL | 5 refills | Status: DC

## 2017-04-17 DIAGNOSIS — I10 Essential (primary) hypertension: Secondary | ICD-10-CM | POA: Diagnosis not present

## 2017-04-17 DIAGNOSIS — E039 Hypothyroidism, unspecified: Secondary | ICD-10-CM | POA: Diagnosis not present

## 2017-04-17 LAB — COMPLETE METABOLIC PANEL WITH GFR
AG Ratio: 1.7 (calc) (ref 1.0–2.5)
ALBUMIN MSPROF: 4.3 g/dL (ref 3.6–5.1)
ALKALINE PHOSPHATASE (APISO): 44 U/L (ref 33–115)
ALT: 8 U/L (ref 6–29)
AST: 13 U/L (ref 10–35)
BILIRUBIN TOTAL: 0.4 mg/dL (ref 0.2–1.2)
BUN: 17 mg/dL (ref 7–25)
CHLORIDE: 103 mmol/L (ref 98–110)
CO2: 30 mmol/L (ref 20–32)
CREATININE: 0.98 mg/dL (ref 0.50–1.10)
Calcium: 9.4 mg/dL (ref 8.6–10.2)
GFR, Est African American: 80 mL/min/{1.73_m2} (ref >=60)
GFR, Est Non African American: 69 mL/min/{1.73_m2} (ref >=60)
Globulin: 2.5 g/dL (calc) (ref 1.9–3.7)
Glucose, Bld: 82 mg/dL (ref 65–99)
Potassium: 3.7 mmol/L (ref 3.5–5.3)
Sodium: 138 mmol/L (ref 135–146)
TOTAL PROTEIN: 6.8 g/dL (ref 6.1–8.1)

## 2017-04-17 LAB — LIPID PANEL
CHOL/HDL RATIO: 4 (calc) (ref <5.0)
CHOLESTEROL: 178 mg/dL (ref <200)
HDL: 45 mg/dL — AB (ref >50)
LDL Cholesterol (Calc): 117 mg/dL (calc) — ABNORMAL HIGH
Non-HDL Cholesterol (Calc): 133 mg/dL (calc) — ABNORMAL HIGH (ref <130)
TRIGLYCERIDES: 65 mg/dL (ref <150)

## 2017-04-17 LAB — TSH: TSH: 2.05 m[IU]/L

## 2017-04-18 ENCOUNTER — Encounter: Payer: Self-pay | Admitting: Family Medicine

## 2017-04-19 MED ORDER — SYNTHROID 125 MCG PO TABS: 125.0000 ug | ORAL_TABLET | Freq: Every day | ORAL | 1 refills | Status: DC

## 2017-04-29 ENCOUNTER — Encounter: Payer: Self-pay | Admitting: Family Medicine

## 2017-05-01 MED ORDER — NITROFURANTOIN MONOHYD MACRO 100 MG PO CAPS: ORAL_CAPSULE | ORAL | 2 refills | Status: DC

## 2017-05-12 ENCOUNTER — Other Ambulatory Visit: Payer: Self-pay | Admitting: Family Medicine

## 2017-05-23 DIAGNOSIS — M25571 Pain in right ankle and joints of right foot: Secondary | ICD-10-CM | POA: Diagnosis not present

## 2017-05-24 ENCOUNTER — Ambulatory Visit (INDEPENDENT_AMBULATORY_CARE_PROVIDER_SITE_OTHER): Payer: BLUE CROSS/BLUE SHIELD | Admitting: Sports Medicine

## 2017-05-24 ENCOUNTER — Encounter: Payer: Self-pay | Admitting: Sports Medicine

## 2017-05-24 DIAGNOSIS — M84374A Stress fracture, right foot, initial encounter for fracture: Secondary | ICD-10-CM

## 2017-05-24 MED ORDER — CALCIUM CARBONATE-VITAMIN D 600-400 MG-UNIT PO TABS: 1.0000 | ORAL_TABLET | Freq: Two times a day (BID) | ORAL | 11 refills | Status: DC

## 2017-05-24 NOTE — Assessment & Plan Note (Signed)
Dorsal third metatarsal pain, also with some metatarsalgia on the plantar aspect. Postop shoe, calcium and vitamin D supplement twice a day, avoid NSAIDs. Cut back steps to about 10,000 steps per day instead of 30. Note written so that she can wear her postop shoe at work.

## 2017-05-24 NOTE — Patient Instructions (Signed)
Stress Fracture Stress fracture is a small break or crack in a bone. A stress fracture can be fully broken (complete) or partially broken (incomplete). The most common sites for stress fractures are the bones in the front of your feet (metatarsals), your heels (calcaneus), and the long bone of your lower leg (tibia). What are the causes? A stress fracture is caused by overuse or repetitive exercise, such as running. It happens when a bone cannot absorb any more shock because the muscles around it are weak. Stress fractures happen most commonly when:  You rapidly increase or start a new physical activity.  You use shoes that are worn out or do not fit you properly.  You exercise on a new surface.  What increases the risk? You may be at higher risk for this type of fracture if:  You have a condition that causes weak bones (osteoporosis).  You are female. Stress fractures are more likely to occur in women.  What are the signs or symptoms? The most common symptom of a stress fracture is feeling pain when you are using the affected part of your body. The pain usually goes away when you are resting. Other symptoms may include:  Swelling of the affected area.  Pain in the area when it is touched.  Decreased pain while resting.  Stress fracture pain usually develops over time. How is this diagnosed? Diagnosis may include:  Medical history and physical exam.  X-rays.  Bone scan.  MRI.  How is this treated? Treatment depends on the severity of your stress fracture. Treatment usually involves resting, icing, compression, and elevation (RICE) of the affected part of your body. Treatment may also include:  Medicines to reduce inflammation.  A cast or a walking shoe.  Crutches.  Surgery.  Follow these instructions at home: If you have a cast:  Do not stick anything inside the cast to scratch your skin. Doing that increases your risk of infection.  Check the skin around the  cast every day. Report any concerns to your health care provider. You may put lotion on dry skin around the edges of the cast. Do not apply lotion to the skin underneath the cast.  Keep the cast clean and dry.  Cover the cast with a watertight plastic bag to protect it from water while you take a bath or a shower. Do not let the cast get wet.  Do not put pressure on any part of the cast until it is fully hardened. This may take several hours. If You Have a Walking Shoe:   Wear it as directed by your health care provider. Managing pain, stiffness, and swelling  If directed, apply ice to the injured area: ? Put ice in a plastic bag. ? Place a towel between your skin and the bag. ? Leave the ice on for 20 minutes, 2-3 times per day.  Move your fingers or toes often to avoid stiffness and to lessen swelling.  Raise the injured area above the level of your heart while you are sitting or lying down. Activity  Rest as directed by your health care provider. Ask your health care provider if you may do alternative exercises, such as swimming or biking, while you are healing.  Return to your normal activities as directed by your health care provider. Ask your health care provider what activities are safe for you.  Perform range-of-motion exercises only as directed by your health care provider. Safety  Do not use the injured limb to support   yourbody weight until your health care provider says that you can. Use crutches if your health care provider tells you to do so. General instructions  Do not use any tobacco products, including cigarettes, chewing tobacco, or electronic cigarettes. Tobacco can delay bone healing. If you need help quitting, ask your health care provider.  Take medicines only as directed by your health care provider.  Keep all follow-up visits as directed by your health care provider. This is important. How is this prevented?  Only wear shoes that: ? Fit well. ? Are  not worn out.  Eat a healthy diet that contains vitamin D and calcium. This helps keeps your bones strong.  Be careful when you start a new physical activity. Give your body time to adjust.  Avoid doing only one kind of activity. Do different exercises, such as swimming and running, so that no single part of your body gets overused.  Do strength-training exercises. Contact a health care provider if:  Your pain gets worse.  You have new symptoms.  You have increased swelling. Get help right away if:  You lose feeling in the affected area. This information is not intended to replace advice given to you by your health care provider. Make sure you discuss any questions you have with your health care provider. Document Released: 03/31/2002 Document Revised: 09/07/2015 Document Reviewed: 08/13/2013 Elsevier Interactive Patient Education  2018 [employer]  

## 2017-05-24 NOTE — Progress Notes (Signed)
Subjective:    I'm seeing this patient as a consultation for: Dr. Beatrice Lecher  CC: Right foot pain  HPI: This is a very pleasant 47 year old female, she has been increasing her exercise, recently from 5000 steps to approximately 30,000 steps per day.  Fortunately she has not started to have pain that she localizes over the dorsum of her right foot, specifically the third metatarsal shaft, with a bit of pain over the plantar metatarsal heads as well.  She was seen in an outside urgent care, x-rays were negative and she was referred out for further evaluation and definitive treatment.  Pain is moderate, worsening.  I reviewed the past medical history, family history, social history, surgical history, and allergies today and no changes were needed.  Please see the problem list section below in epic for further details.  Past Medical History: Past Medical History:  Diagnosis Date  . Arthritis   . Hypertension   . PCOS (polycystic ovarian syndrome)   . Thyroid disease   . TIA (transient ischemic attack)    Past Surgical History: Past Surgical History:  Procedure Laterality Date  . 2 ectopic pregnancies  [id number]  . lymph nodes    . melanoma removal     RT SIDE  . removal of fallopian tube  1999   Social History: Social History   Socioeconomic History  . Marital status: Married    Spouse name: Herbie Baltimore  . Number of children: 3  . Years of education: Not on file  . Highest education level: Not on file  Occupational History  . Occupation: Education officer, museum IN ED    Employer: [employer]: [employer] [place].   Social Needs  . Financial resource strain: Not on file  . Food insecurity:    Worry: Not on file    Inability: Not on file  . Transportation needs:    Medical: Not on file    Non-medical: Not on file  Tobacco Use  . Smoking status: Never Smoker  . Smokeless tobacco: Never Used  Substance and Sexual Activity  . Alcohol use: No  . Drug use: No  .  Sexual activity: Yes    Partners: Male    Comment: adopted  Lifestyle  . Physical activity:    Days per week: Not on file    Minutes per session: Not on file  . Stress: Not on file  Relationships  . Social connections:    Talks on phone: Not on file    Gets together: Not on file    Attends religious service: Not on file    Active member of club or organization: Not on file    Attends meetings of clubs or organizations: Not on file    Relationship status: Not on file  Other Topics Concern  . Not on file  Social History Narrative   Caffeine daily. No regular exercise.  She was adopted.    Family History: Family History  Problem Relation Age of Onset  . Melanoma Mother   . Depression Mother   . Hyperlipidemia Mother   . Colon cancer Maternal Grandmother   . Colon cancer Father 1       deceased.    Allergies: Allergies  Allergen Reactions  . Cymbalta [Duloxetine Hcl] Other (See Comments)    Jolting/jerking sensations.   . Sulfonamide Derivatives Hives  . Tape Rash    Use paper tape   Medications: See med rec.  Review of Systems: No headache, visual  changes, nausea, vomiting, diarrhea, constipation, dizziness, abdominal pain, skin rash, fevers, chills, night sweats, weight loss, swollen lymph nodes, body aches, joint swelling, muscle aches, chest pain, shortness of breath, mood changes, visual or auditory hallucinations.   Objective:   General: Well Developed, well nourished, and in no acute distress.  Neuro:  Extra-ocular muscles intact, able to move all 4 extremities, sensation grossly intact.  Deep tendon reflexes tested were normal. Psych: Alert and oriented, mood congruent with affect. ENT:  Ears and nose appear unremarkable.  Hearing grossly normal. Neck: Unremarkable overall appearance, trachea midline.  No visible thyroid enlargement. Eyes: Conjunctivae and lids appear unremarkable.  Pupils equal and round. Skin: Warm and dry, no rashes noted.  Cardiovascular:  Pulses palpable, no extremity edema. Right foot: No visible erythema or swelling. Range of motion is full in all directions. Strength is 5/5 in all directions. No hallux valgus. No pes cavus or pes planus. No abnormal callus noted. No pain over the navicular prominence, or base of fifth metatarsal. No tenderness to palpation of the calcaneal insertion of plantar fascia. No pain at the Achilles insertion. No pain over the calcaneal bursa. No pain of the retrocalcaneal bursa. Moderate tenderness over the third metatarsal shaft No hallux rigidus or limitus. No tenderness palpation over interphalangeal joints. No pain with compression of the metatarsal heads. Neurovascularly intact distally.  Foot x-rays from an outside facility were negative  Impression and Recommendations:   This case required medical decision making of moderate complexity.  Stress fracture of metatarsal bone of right foot Dorsal third metatarsal pain, also with some metatarsalgia on the plantar aspect. Postop shoe, calcium and vitamin D supplement twice a day, avoid NSAIDs. Cut back steps to about 10,000 steps per day instead of 30. Note written so that she can wear her postop shoe at work.  ___________________________________________ Gwen Her. Dianah Field, M.D., ABFM., CAQSM. Primary Care and [place] Instructor of [place] [facility] of Medicine

## 2017-06-01 ENCOUNTER — Other Ambulatory Visit: Payer: Self-pay | Admitting: Family Medicine

## 2017-06-18 ENCOUNTER — Encounter: Payer: Self-pay | Admitting: Physician Assistant

## 2017-06-18 ENCOUNTER — Encounter: Payer: Self-pay | Admitting: Sports Medicine

## 2017-06-18 ENCOUNTER — Ambulatory Visit (INDEPENDENT_AMBULATORY_CARE_PROVIDER_SITE_OTHER): Payer: BLUE CROSS/BLUE SHIELD | Admitting: Sports Medicine

## 2017-06-18 ENCOUNTER — Ambulatory Visit (INDEPENDENT_AMBULATORY_CARE_PROVIDER_SITE_OTHER): Payer: BLUE CROSS/BLUE SHIELD | Admitting: Physician Assistant

## 2017-06-18 VITALS — BP 116/72 | HR 79 | Temp 98.4°F | Wt 145.0 lb

## 2017-06-18 DIAGNOSIS — B9689 Other specified bacterial agents as the cause of diseases classified elsewhere: Secondary | ICD-10-CM

## 2017-06-18 DIAGNOSIS — J019 Acute sinusitis, unspecified: Secondary | ICD-10-CM | POA: Diagnosis not present

## 2017-06-18 DIAGNOSIS — M130 Polyarthritis, unspecified: Secondary | ICD-10-CM | POA: Diagnosis not present

## 2017-06-18 DIAGNOSIS — J4530 Mild persistent asthma, uncomplicated: Secondary | ICD-10-CM

## 2017-06-18 MED ORDER — CEFUROXIME AXETIL 250 MG PO TABS: 250.0000 mg | ORAL_TABLET | Freq: Two times a day (BID) | ORAL | 0 refills | Status: AC

## 2017-06-18 MED ORDER — ALBUTEROL SULFATE HFA 108 (90 BASE) MCG/ACT IN AERS: 2.0000 | INHALATION_SPRAY | RESPIRATORY_TRACT | 0 refills | Status: DC | PRN

## 2017-06-18 NOTE — Progress Notes (Signed)
HPI:                                                                Carly [name] is a 47 y.o. female who presents to [facility] [place] today for sinusitis  Sinusitis  This is a new problem. The current episode started 1 to 4 weeks ago. The problem is unchanged. There has been no fever. The pain is mild. Associated symptoms include congestion, coughing (productive of purulent sputum), headaches, sinus pressure and swollen glands. Pertinent negatives include no ear pain or shortness of breath. (+ fatigue) Past treatments include spray decongestants (Mucinex, Acapella). The treatment provided no relief.  Has needed to use rescue inhaler on a few occasions in the last week.    No flowsheet data found.    Past Medical History:  Diagnosis Date  . Arthritis   . Hypertension   . PCOS (polycystic ovarian syndrome)   . Thyroid disease   . TIA (transient ischemic attack)    Past Surgical History:  Procedure Laterality Date  . 2 ectopic pregnancies  [id number]  . lymph nodes    . melanoma removal     RT SIDE  . removal of fallopian tube  1999   Social History   Tobacco Use  . Smoking status: Never Smoker  . Smokeless tobacco: Never Used  Substance Use Topics  . Alcohol use: No   family history includes Colon cancer in her maternal grandmother; Colon cancer (age of onset: 14) in her father; Depression in her mother; Hyperlipidemia in her mother; Melanoma in her mother.    ROS: negative except as noted in the HPI  Medications: Current Outpatient Medications  Medication Sig Dispense Refill  . Calcium Carbonate-Vitamin D 600-400 MG-UNIT tablet Take 1 tablet by mouth 2 (two) times daily. 60 tablet 11  . fluticasone (FLONASE) 50 MCG/ACT nasal spray Place 2 sprays into the nose daily as needed. For seasonal allergy nasal congestion 16 g 1  . levonorgestrel (MIRENA) 20 MCG/24HR IUD 1 Intra Uterine Device (1 each total) by Intrauterine  route once. Inserted 10/19/14 1 each 0  . losartan-hydrochlorothiazide (HYZAAR) 50-12.5 MG tablet TAKE 1 TABLET BY MOUTH ONCE DAILY 150 tablet 0  . metFORMIN (GLUCOPHAGE) 1000 MG tablet TAKE 1 TABLET BY MOUTH TWICE DAILY WITH MEALS 90 tablet 0  . nitrofurantoin, macrocrystal-monohydrate, (MACROBID) 100 MG capsule Take one tablet after intercourse to prevent UTI's. 30 capsule 2  . Olopatadine HCl 0.2 % SOLN Apply 1 drop to eye daily. 2.5 mL 3  . omeprazole (PRILOSEC) 40 MG capsule Take 1 capsule (40 mg total) by mouth 2 (two) times daily. 60 capsule 6  . spironolactone (ALDACTONE) 25 MG tablet TAKE 1 TABLET BY MOUTH ONCE DAILY 90 tablet 3  . SYNTHROID 125 MCG tablet Take 1 tablet (125 mcg total) by mouth daily before breakfast. 90 tablet 1  . zolpidem (AMBIEN) 10 MG tablet Take 1 tablet (10 mg total) by mouth at bedtime. 30 tablet 5   No current facility-administered medications for this visit.    Allergies  Allergen Reactions  . Cymbalta [Duloxetine Hcl] Other (See Comments)    Jolting/jerking sensations.   . Sulfonamide Derivatives Hives  . Tape Rash    Use paper tape  Objective:  BP 116/72   Pulse 79   Temp 98.4 F (36.9 C) (Oral)   Wt 145 lb (65.8 kg)   SpO2 97%   BMI 24.89 kg/m  Gen:  alert, not ill-appearing, no distress, appropriate for age 79: head normocephalic without obvious abnormality, conjunctiva and cornea clear, TM's pearly gray and semi-transparent, oropharynx clear, nasal mucosa edematous with rhinorrhea, nares erythematous and dry, there is tender left tonsillar adenopathy, neck supple, trachea midline Pulm: Normal work of breathing, normal phonation, clear to auscultation bilaterally, no wheezes, rales or rhonchi CV: Normal rate, regular rhythm, s1 and s2 distinct, no murmurs, clicks or rubs  Neuro: alert and oriented x 3, no tremor MSK: extremities atraumatic, normal gait and station Skin: intact, no rashes on exposed skin, no jaundice, no  cyanosis Psych: well-groomed, cooperative, good eye contact, euthymic mood, affect mood-congruent, speech is articulate, and thought processes clear and goal-directed    No results found for this or any previous visit (from the past 72 hour(s)). No results found.    Assessment and Plan: 47 y.o. female with   Acute bacterial sinusitis - Plan: cefUROXime (CEFTIN) 250 MG tablet  Mild persistent asthma without complication - Plan: albuterol (PROVENTIL HFA;VENTOLIN HFA) 108 (90 Base) MCG/ACT inhaler  - afebrile, no tachypnea, no tachycardia, SpO2 97% on RA at rest, no adventitious lung sounds. Increased need for rescue inhaler, so refilling this. She declines Prednisone burst - treating empirically for bacterial sinusitis with Ceftin x 10 days. Counseled on supportive care    Patient education and anticipatory guidance given Patient agrees with treatment plan Follow-up as needed if symptoms worsen or fail to improve  Darlyne Russian PA-C

## 2017-06-18 NOTE — Progress Notes (Signed)
Subjective:    CC: Left finger pain  HPI: This is a pleasant 47 year old female with an increase in pain at her left third PIP.  Moderate, persistent, localized without radiation.  She has had an injection here about 6 months ago.  At this point oral analgesics and NSAIDs are not effective.  I reviewed the past medical history, family history, social history, surgical history, and allergies today and no changes were needed.  Please see the problem list section below in epic for further details.  Past Medical History: Past Medical History:  Diagnosis Date  . Arthritis   . Hypertension   . PCOS (polycystic ovarian syndrome)   . Thyroid disease   . TIA (transient ischemic attack)    Past Surgical History: Past Surgical History:  Procedure Laterality Date  . 2 ectopic pregnancies  [id number]  . lymph nodes    . melanoma removal     RT SIDE  . removal of fallopian tube  1999   Social History: Social History   Socioeconomic History  . Marital status: Married    Spouse name: Herbie Baltimore  . Number of children: 3  . Years of education: Not on file  . Highest education level: Not on file  Occupational History  . Occupation: Education officer, museum IN ED    Employer: [employer]: [employer].   Social Needs  . Financial resource strain: Not on file  . Food insecurity:    Worry: Not on file    Inability: Not on file  . Transportation needs:    Medical: Not on file    Non-medical: Not on file  Tobacco Use  . Smoking status: Never Smoker  . Smokeless tobacco: Never Used  Substance and Sexual Activity  . Alcohol use: No  . Drug use: No  . Sexual activity: Yes    Partners: Male    Comment: adopted  Lifestyle  . Physical activity:    Days per week: Not on file    Minutes per session: Not on file  . Stress: Not on file  Relationships  . Social connections:    Talks on phone: Not on file    Gets together: Not on file    Attends religious service: Not on file   Active member of club or organization: Not on file    Attends meetings of clubs or organizations: Not on file    Relationship status: Not on file  Other Topics Concern  . Not on file  Social History Narrative   Caffeine daily. No regular exercise.  She was adopted.    Family History: Family History  Problem Relation Age of Onset  . Melanoma Mother   . Depression Mother   . Hyperlipidemia Mother   . Colon cancer Maternal Grandmother   . Colon cancer Father 19       deceased.    Allergies: Allergies  Allergen Reactions  . Cymbalta [Duloxetine Hcl] Other (See Comments)    Jolting/jerking sensations.   . Sulfonamide Derivatives Hives  . Tape Rash    Use paper tape   Medications: See med rec.  Review of Systems: No fevers, chills, night sweats, weight loss, chest pain, or shortness of breath.   Objective:    General: Well Developed, well nourished, and in no acute distress.  Neuro: Alert and oriented x3, extra-ocular muscles intact, sensation grossly intact.  HEENT: Normocephalic, atraumatic, pupils equal round reactive to light, neck supple, no masses, no lymphadenopathy, thyroid nonpalpable.  Skin:  Warm and dry, no rashes. Cardiac: Regular rate and rhythm, no murmurs rubs or gallops, no lower extremity edema.  Respiratory: Clear to auscultation bilaterally. Not using accessory muscles, speaking in full sentences. Left hand: Fusiform swelling at the PIP of the third digit, tender to palpation here.  Procedure: Real-time Ultrasound Guided Injection of left third PIP Device: GE Logiq E  Verbal informed consent obtained.  Time-out conducted.  Noted no overlying erythema, induration, or other signs of local infection.  Skin prepped in a sterile fashion.  Local anesthesia: Topical Ethyl chloride.  With sterile technique and under real time ultrasound guidance: 1/2 cc kenalog 40, 1/2 cc lidocaine injected easily Completed without difficulty  Pain immediately resolved  suggesting accurate placement of the medication.  Advised to call if fevers/chills, erythema, induration, drainage, or persistent bleeding.  Images permanently stored and available for review in the ultrasound unit.  Impression: Technically successful ultrasound guided injection.  Impression and Recommendations:    Polyarthritis with left third PIP osteoarthritis Previous injection was November 2018, repeat left third PIP injection. Return as needed. ___________________________________________ Gwen Her. Dianah Field, M.D., ABFM., CAQSM. Primary Care and [place] Instructor of [place] [facility] of Medicine

## 2017-06-18 NOTE — Assessment & Plan Note (Signed)
Previous injection was November 2018, repeat left third PIP injection. Return as needed.

## 2017-06-18 NOTE — Patient Instructions (Addendum)
- antibiotic twice a day for 10 days - nasal saline rinses / netti pot - intranasal steroid like Flonase or Nasonex may be beneficial - Ayr nasal gel or Aquafor or Vaseline for nosebleeds (apply to the nasal passages gentle with a Q-tip) - warm facial compresses - tylenol [phone] mg every 8 hours or Ibuprofen 400-600 mg every 6 hours as needed for pain/headaches - oral decongestants like Sudafed may help with nasal symptoms, but avoid if you have high blood pressure, heart disease or kidney disease   Sinusitis, Adult Sinusitis is soreness and inflammation of your sinuses. Sinuses are hollow spaces in the bones around your face. Your sinuses are located:  Around your eyes.  In the middle of your forehead.  Behind your nose.  In your cheekbones.  Your sinuses and nasal passages are lined with a stringy fluid (mucus). Mucus normally drains out of your sinuses. When your nasal tissues become inflamed or swollen, the mucus can become trapped or blocked so air cannot flow through your sinuses. This allows bacteria, viruses, and funguses to grow, which leads to infection. Sinusitis can develop quickly and last for 7?10 days (acute) or for more than 12 weeks (chronic). Sinusitis often develops after a cold. What are the causes? This condition is caused by anything that creates swelling in the sinuses or stops mucus from draining, including:  Allergies.  Asthma.  Bacterial or viral infection.  Abnormally shaped bones between the nasal passages.  Nasal growths that contain mucus (nasal polyps).  Narrow sinus openings.  Pollutants, such as chemicals or irritants in the air.  A foreign object stuck in the nose.  A fungal infection. This is rare.  What increases the risk? The following factors may make you more likely to develop this condition:  Having allergies or asthma.  Having had a recent cold or respiratory tract infection.  Having structural deformities or blockages in  your nose or sinuses.  Having a weak immune system.  Doing a lot of swimming or diving.  Overusing nasal sprays.  Smoking.  What are the signs or symptoms? The main symptoms of this condition are pain and a feeling of pressure around the affected sinuses. Other symptoms include:  Upper toothache.  Earache.  Headache.  Bad breath.  Decreased sense of smell and taste.  A cough that may get worse at night.  Fatigue.  Fever.  Thick drainage from your nose. The drainage is often green and it may contain pus (purulent).  Stuffy nose or congestion.  Postnasal drip. This is when extra mucus collects in the throat or back of the nose.  Swelling and warmth over the affected sinuses.  Sore throat.  Sensitivity to light.  How is this diagnosed? This condition is diagnosed based on symptoms, a medical history, and a physical exam. To find out if your condition is acute or chronic, your health care provider may:  Look in your nose for signs of nasal polyps.  Tap over the affected sinus to check for signs of infection.  View the inside of your sinuses using an imaging device that has a light attached (endoscope).  If your health care provider suspects that you have chronic sinusitis, you may also:  Be tested for allergies.  Have a sample of mucus taken from your nose (nasal culture) and checked for bacteria.  Have a mucus sample examined to see if your sinusitis is related to an allergy.  If your sinusitis does not respond to treatment and it lasts longer  than 8 weeks, you may have an MRI or CT scan to check your sinuses. These scans also help to determine how severe your infection is. In rare cases, a bone biopsy may be done to rule out more serious types of fungal sinus disease. How is this treated? Treatment for sinusitis depends on the cause and whether your condition is chronic or acute. If a virus is causing your sinusitis, your symptoms will go away on their own  within 10 days. You may be given medicines to relieve your symptoms, including:  Topical nasal decongestants. They shrink swollen nasal passages and let mucus drain from your sinuses.  Antihistamines. These drugs block inflammation that is triggered by allergies. This can help to ease swelling in your nose and sinuses.  Topical nasal corticosteroids. These are nasal sprays that ease inflammation and swelling in your nose and sinuses.  Nasal saline washes. These rinses can help to get rid of thick mucus in your nose.  If your condition is caused by bacteria, you will be given an antibiotic medicine. If your condition is caused by a fungus, you will be given an antifungal medicine. Surgery may be needed to correct underlying conditions, such as narrow nasal passages. Surgery may also be needed to remove polyps. Follow these instructions at home: Medicines  Take, use, or apply over-the-counter and prescription medicines only as told by your health care provider. These may include nasal sprays.  If you were prescribed an antibiotic medicine, take it as told by your health care provider. Do not stop taking the antibiotic even if you start to feel better. Hydrate and Humidify  Drink enough water to keep your urine clear or pale yellow. Staying hydrated will help to thin your mucus.  Use a cool mist humidifier to keep the humidity level in your home above 50%.  Inhale steam for 10-15 minutes, 3-4 times a day or as told by your health care provider. You can do this in the bathroom while a hot shower is running.  Limit your exposure to cool or dry air. Rest  Rest as much as possible.  Sleep with your head raised (elevated).  Make sure to get enough sleep each night. General instructions  Apply a warm, moist washcloth to your face 3-4 times a day or as told by your health care provider. This will help with discomfort.  Wash your hands often with soap and water to reduce your exposure to  viruses and other germs. If soap and water are not available, use hand sanitizer.  Do not smoke. Avoid being around people who are smoking (secondhand smoke).  Keep all follow-up visits as told by your health care provider. This is important. Contact a health care provider if:  You have a fever.  Your symptoms get worse.  Your symptoms do not improve within 10 days. Get help right away if:  You have a severe headache.  You have persistent vomiting.  You have pain or swelling around your face or eyes.  You have vision problems.  You develop confusion.  Your neck is stiff.  You have trouble breathing. This information is not intended to replace advice given to you by your health care provider. Make sure you discuss any questions you have with your health care provider. Document Released: 01/08/2005 Document Revised: 09/04/2015 Document Reviewed: 11/03/2014 Elsevier Interactive Patient Education  [employer].

## 2017-06-21 ENCOUNTER — Ambulatory Visit: Payer: Self-pay | Admitting: Sports Medicine

## 2017-06-29 ENCOUNTER — Other Ambulatory Visit: Payer: Self-pay | Admitting: Family Medicine

## 2017-07-03 ENCOUNTER — Encounter: Payer: Self-pay | Admitting: Sports Medicine

## 2017-08-12 DIAGNOSIS — K259 Gastric ulcer, unspecified as acute or chronic, without hemorrhage or perforation: Secondary | ICD-10-CM | POA: Diagnosis not present

## 2017-08-12 DIAGNOSIS — D509 Iron deficiency anemia, unspecified: Secondary | ICD-10-CM | POA: Diagnosis not present

## 2017-08-12 DIAGNOSIS — E538 Deficiency of other specified B group vitamins: Secondary | ICD-10-CM | POA: Diagnosis not present

## 2017-08-12 DIAGNOSIS — K5730 Diverticulosis of large intestine without perforation or abscess without bleeding: Secondary | ICD-10-CM | POA: Diagnosis not present

## 2017-09-23 ENCOUNTER — Encounter: Payer: Self-pay | Admitting: Sports Medicine

## 2017-09-23 DIAGNOSIS — M47816 Spondylosis without myelopathy or radiculopathy, lumbar region: Secondary | ICD-10-CM

## 2017-09-24 NOTE — Telephone Encounter (Signed)
Roberta notified. 

## 2017-09-24 NOTE — Telephone Encounter (Signed)
Epidural ordered, please contact [facility] for scheduling. 

## 2017-09-27 ENCOUNTER — Ambulatory Visit
Admission: RE | Admit: 2017-09-27 | Discharge: 2017-09-27 | Disposition: A | Discharge status: Home / Self Care | Payer: BLUE CROSS/BLUE SHIELD | Source: Ambulatory Visit | Attending: Sports Medicine | Admitting: Sports Medicine

## 2017-09-27 DIAGNOSIS — M47817 Spondylosis without myelopathy or radiculopathy, lumbosacral region: Secondary | ICD-10-CM | POA: Diagnosis not present

## 2017-09-27 MED ORDER — METHYLPREDNISOLONE ACETATE 40 MG/ML INJ SUSP (RADIOLOG
120.0000 mg | Freq: Once | INTRAMUSCULAR | Status: AC
  Administered 2017-09-27: 120 mg via EPIDURAL

## 2017-09-27 MED ORDER — IOPAMIDOL (ISOVUE-M 200) INJECTION 41%
1.0000 mL | Freq: Once | INTRAMUSCULAR | Status: AC
  Administered 2017-09-27: 1 mL via EPIDURAL

## 2017-09-29 ENCOUNTER — Other Ambulatory Visit: Payer: Self-pay | Admitting: Family Medicine

## 2017-10-06 ENCOUNTER — Other Ambulatory Visit: Payer: Self-pay | Admitting: Sports Medicine

## 2017-10-06 ENCOUNTER — Other Ambulatory Visit: Payer: Self-pay | Admitting: Family Medicine

## 2017-10-06 DIAGNOSIS — M47816 Spondylosis without myelopathy or radiculopathy, lumbar region: Secondary | ICD-10-CM

## 2017-10-07 ENCOUNTER — Other Ambulatory Visit: Payer: Self-pay

## 2017-10-07 NOTE — Telephone Encounter (Signed)
Routing to pcp for signature.Carly [name], CMA  

## 2017-10-14 ENCOUNTER — Encounter: Payer: Self-pay | Admitting: Family Medicine

## 2017-10-14 ENCOUNTER — Ambulatory Visit (INDEPENDENT_AMBULATORY_CARE_PROVIDER_SITE_OTHER): Payer: BLUE CROSS/BLUE SHIELD | Admitting: Family Medicine

## 2017-10-14 VITALS — BP 97/62 | HR 78 | Ht 64.0 in | Wt 145.0 lb

## 2017-10-14 DIAGNOSIS — I10 Essential (primary) hypertension: Secondary | ICD-10-CM | POA: Diagnosis not present

## 2017-10-14 DIAGNOSIS — Z1231 Encounter for screening mammogram for malignant neoplasm of breast: Secondary | ICD-10-CM | POA: Diagnosis not present

## 2017-10-14 DIAGNOSIS — E039 Hypothyroidism, unspecified: Secondary | ICD-10-CM | POA: Diagnosis not present

## 2017-10-14 DIAGNOSIS — J4520 Mild intermittent asthma, uncomplicated: Secondary | ICD-10-CM

## 2017-10-14 NOTE — Patient Instructions (Signed)
Go ahead and catch her blood pressure medication in half.  Take half a tab daily.

## 2017-10-14 NOTE — Progress Notes (Signed)
Subjective:    Patient ID: Carly [name], female    DOB: July 03, 1970, 47 y.o.   MRN: [id number]  HPI  Hypertension- Pt denies chest pain, SOB, dizziness, or heart palpitations.  Taking meds as directed w/o problems.  Denies medication side effects.    She gets her pap done by Dr. Imelda Pillow at [facility] in [place].   F/U PCOS  -he is currently on metformin and spironolactone. She is doing wel.   Follow up asthma-she is actually done really well the spring and summer with no flares or exacerbations.  She says her albuterol inhaler is up-to-date.  She has not had to use it recently.  Usually does use Flonase in the fall but has not started it yet.  Hypothyroidism - Taking medication regularly in the AM away from food and vitamins, etc. No recent change to skin, hair, or energy levels.  Review of Systems     BP 97/62   Pulse 78   Ht 5\' 4"  (1.626 m)   Wt 145 lb (65.8 kg)   SpO2 99%   BMI 24.89 kg/m     Allergies  Allergen Reactions  . Cymbalta [Duloxetine Hcl] Other (See Comments)    Jolting/jerking sensations.   . Sulfonamide Derivatives Hives  . Tape Rash    Use paper tape    Past Medical History:  Diagnosis Date  . Arthritis   . Hypertension   . PCOS (polycystic ovarian syndrome)   . Thyroid disease   . TIA (transient ischemic attack)     Past Surgical History:  Procedure Laterality Date  . 2 ectopic pregnancies  0086,7619  . lymph nodes    . melanoma removal     RT SIDE  . removal of fallopian tube  1999    Social History   Socioeconomic History  . Marital status: Married    Spouse name: Herbie Baltimore  . Number of children: 3  . Years of education: Not on file  . Highest education level: Not on file  Occupational History  . Occupation: Education officer, museum [place]    Employer: [place] [place].   Social Needs  . Financial resource strain: Not on file  . Food insecurity:    Worry: Not on file    Inability: Not on file  . Transportation needs:     Medical: Not on file    Non-medical: Not on file  Tobacco Use  . Smoking status: Never Smoker  . Smokeless tobacco: Never Used  Substance and Sexual Activity  . Alcohol use: No  . Drug use: No  . Sexual activity: Yes    Partners: Male    Comment: adopted  Lifestyle  . Physical activity:    Days per week: Not on file    Minutes per session: Not on file  . Stress: Not on file  Relationships  . Social connections:    Talks on phone: Not on file    Gets together: Not on file    Attends religious service: Not on file    Active member of club or organization: Not on file    Attends meetings of clubs or organizations: Not on file    Relationship status: Not on file  . Intimate partner violence:    Fear of current or ex partner: Not on file    Emotionally abused: Not on file    Physically abused: Not on file    Forced sexual activity: Not on file  Other Topics  Concern  . Not on file  Social History Narrative   Caffeine daily. No regular exercise.  She was adopted.     Family History  Problem Relation Age of Onset  . Melanoma Mother   . Depression Mother   . Hyperlipidemia Mother   . Colon cancer Maternal Grandmother   . Colon cancer Father 18       deceased.     Outpatient Encounter Medications as of 10/14/2017  Medication Sig  . albuterol (PROVENTIL HFA;VENTOLIN HFA) 108 (90 Base) MCG/ACT inhaler Inhale 2 puffs into the lungs every 4 (four) hours as needed for wheezing or shortness of breath (bronchospasm).  . Calcium Carbonate-Vitamin D 600-400 MG-UNIT tablet Take 1 tablet by mouth 2 (two) times daily.  . fluticasone (FLONASE) 50 MCG/ACT nasal spray Place 2 sprays into the nose daily as needed. For seasonal allergy nasal congestion  . gabapentin (NEURONTIN) 300 MG capsule TAKE 1 CAPSULE BY MOUTH AT BEDTIME FOR 7 DAYS THEN 1 CAPSULE TWICE DAILY FOR 7 DAYS THEN 1 CAPSULE THREE TIMES DAILY  . levonorgestrel (MIRENA) 20 MCG/24HR IUD 1 Intra Uterine Device (1 each total) by  Intrauterine route once. Inserted 10/19/14  . losartan-hydrochlorothiazide (HYZAAR) 50-12.5 MG tablet TAKE 1 TABLET BY MOUTH ONCE DAILY  . metFORMIN (GLUCOPHAGE) 1000 MG tablet TAKE 1 TABLET BY MOUTH TWICE DAILY WITH MEALS  . Olopatadine HCl 0.2 % SOLN Apply 1 drop to eye daily.  Marland Kitchen omeprazole (PRILOSEC) 40 MG capsule Take 1 capsule (40 mg total) by mouth 2 (two) times daily.  Marland Kitchen spironolactone (ALDACTONE) 25 MG tablet TAKE 1 TABLET BY MOUTH ONCE DAILY  . SYNTHROID 125 MCG tablet Take 1 tablet (125 mcg total) by mouth daily before breakfast.  . zolpidem (AMBIEN) 10 MG tablet TAKE 1 TABLET BY MOUTH AT BEDTIME   No facility-administered encounter medications on file as of 10/14/2017.       Objective:   Physical Exam  Constitutional: She is oriented to person, place, and time. She appears well-developed and well-nourished.  HENT:  Head: Normocephalic and atraumatic.  Cardiovascular: Normal rate, regular rhythm and normal heart sounds.  Pulmonary/Chest: Effort normal and breath sounds normal.  Neurological: She is alert and oriented to person, place, and time.  Skin: Skin is warm and dry.  Psychiatric: She has a normal mood and affect. Her behavior is normal.       Assessment & Plan:  HTN - cut BP tab in half, check BMP.    PCOS -continue metformin and spironolactone.  Due to check a BMP.  Hypothyroid is him-due to recheck TSH.  Will adjust dose if needed.  Asthma, mild intermittant - Stable.  No recent flares. INhaler is up to date.

## 2017-10-15 ENCOUNTER — Encounter: Payer: Self-pay | Admitting: Family Medicine

## 2017-10-15 LAB — BASIC METABOLIC PANEL WITH GFR
BUN: 16 mg/dL (ref 7–25)
CO2: 29 mmol/L (ref 20–32)
CREATININE: 1.03 mg/dL (ref 0.50–1.10)
Calcium: 9.7 mg/dL (ref 8.6–10.2)
Chloride: 101 mmol/L (ref 98–110)
GFR, Est African American: 74 mL/min/{1.73_m2} (ref >=60)
GFR, Est Non African American: 64 mL/min/{1.73_m2} (ref >=60)
GLUCOSE: 75 mg/dL (ref 65–99)
POTASSIUM: 4 mmol/L (ref 3.5–5.3)
SODIUM: 138 mmol/L (ref 135–146)

## 2017-10-15 LAB — TSH: TSH: 5.18 mIU/L — ABNORMAL HIGH

## 2017-10-16 ENCOUNTER — Other Ambulatory Visit: Payer: Self-pay | Admitting: *Deleted

## 2017-10-16 DIAGNOSIS — E039 Hypothyroidism, unspecified: Secondary | ICD-10-CM

## 2017-10-16 MED ORDER — SYNTHROID 125 MCG PO TABS: 125.0000 ug | ORAL_TABLET | Freq: Every day | ORAL | 1 refills | Status: DC

## 2017-10-18 MED ORDER — SYNTHROID 137 MCG PO TABS: 137.0000 ug | ORAL_TABLET | Freq: Every day | ORAL | 0 refills | Status: DC

## 2017-11-17 ENCOUNTER — Encounter: Payer: Self-pay | Admitting: Family Medicine

## 2017-11-18 MED ORDER — LOSARTAN POTASSIUM-HCTZ 50-12.5 MG PO TABS: 0.5000 | ORAL_TABLET | Freq: Every day | ORAL | 3 refills | Status: DC

## 2017-11-25 DIAGNOSIS — R29898 Other symptoms and signs involving the musculoskeletal system: Secondary | ICD-10-CM | POA: Diagnosis not present

## 2017-11-25 DIAGNOSIS — M542 Cervicalgia: Secondary | ICD-10-CM | POA: Diagnosis not present

## 2017-11-25 DIAGNOSIS — G8929 Other chronic pain: Secondary | ICD-10-CM | POA: Diagnosis not present

## 2017-12-01 ENCOUNTER — Other Ambulatory Visit: Payer: Self-pay | Admitting: Family Medicine

## 2017-12-03 ENCOUNTER — Ambulatory Visit (INDEPENDENT_AMBULATORY_CARE_PROVIDER_SITE_OTHER): Payer: BLUE CROSS/BLUE SHIELD

## 2017-12-03 ENCOUNTER — Ambulatory Visit (INDEPENDENT_AMBULATORY_CARE_PROVIDER_SITE_OTHER): Payer: BLUE CROSS/BLUE SHIELD | Admitting: Sports Medicine

## 2017-12-03 DIAGNOSIS — M5412 Radiculopathy, cervical region: Secondary | ICD-10-CM

## 2017-12-03 DIAGNOSIS — M130 Polyarthritis, unspecified: Secondary | ICD-10-CM | POA: Diagnosis not present

## 2017-12-03 DIAGNOSIS — M5030 Other cervical disc degeneration, unspecified cervical region: Secondary | ICD-10-CM | POA: Insufficient documentation

## 2017-12-03 DIAGNOSIS — M50122 Cervical disc disorder at C5-C6 level with radiculopathy: Secondary | ICD-10-CM | POA: Diagnosis not present

## 2017-12-03 DIAGNOSIS — M542 Cervicalgia: Secondary | ICD-10-CM | POA: Diagnosis not present

## 2017-12-03 MED ORDER — PREDNISONE 50 MG PO TABS: ORAL_TABLET | ORAL | 0 refills | Status: DC

## 2017-12-03 NOTE — Assessment & Plan Note (Signed)
Failed greater than 6 weeks of physician directed conservative measures, right C7 distribution radiculitis. Burst of prednisone today, x-rays, MRI.

## 2017-12-03 NOTE — Assessment & Plan Note (Signed)
Previous injection was 7 months ago, repeat left third PIP injection, return as needed.

## 2017-12-03 NOTE — Patient Instructions (Signed)
Cervical Radiculopathy Cervical radiculopathy happens when a nerve in the neck (cervical nerve) is pinched or bruised. This condition can develop because of an injury or as part of the normal aging process. Pressure on the cervical nerves can cause pain or numbness that runs from the neck all the way down into the arm and fingers. Usually, this condition gets better with rest. Treatment may be needed if the condition does not improve. What are the causes? This condition may be caused by:  Injury.  Slipped (herniated) disk.  Muscle tightness in the neck because of overuse.  Arthritis.  Breakdown or degeneration in the bones and joints of the spine (spondylosis) due to aging.  Bone spurs that may develop near the cervical nerves.  What are the signs or symptoms? Symptoms of this condition include:  Pain that runs from the neck to the arm and hand. The pain can be severe or irritating. It may be worse when the neck is moved.  Numbness or weakness in the affected arm and hand.  How is this diagnosed? This condition may be diagnosed based on symptoms, medical history, and a physical exam. You may also have tests, including:  X-rays.  CT scan.  MRI.  Electromyogram (EMG).  Nerve conduction tests.  How is this treated? In many cases, treatment is not needed for this condition. With rest, the condition usually gets better over time. If treatment is needed, options may include:  Wearing a soft neck collar for short periods of time.  Physical therapy to strengthen your neck muscles.  Medicines, such as NSAIDs, oral corticosteroids, or spinal injections.  Surgery. This may be needed if other treatments do not help. Various types of surgery may be done depending on the cause of your problems.  Follow these instructions at home: Managing pain  Take over-the-counter and prescription medicines only as told by your health care provider.  If directed, apply ice to the affected  area. ? Put ice in a plastic bag. ? Place a towel between your skin and the bag. ? Leave the ice on for 20 minutes, 2-3 times per day.  If ice does not help, you can try using heat. Take a warm shower or warm bath, or use a heat pack as told by your health care provider.  Try a gentle neck and shoulder massage to help relieve symptoms. Activity  Rest as needed. Follow instructions from your health care provider about any restrictions on activities.  Do stretching and strengthening exercises as told by your health care provider or physical therapist. General instructions  If you were given a soft collar, wear it as told by your health care provider.  Use a flat pillow when you sleep.  Keep all follow-up visits as told by your health care provider. This is important. Contact a health care provider if:  Your condition does not improve with treatment. Get help right away if:  Your pain gets much worse and cannot be controlled with medicines.  You have weakness or numbness in your hand, arm, face, or leg.  You have a high fever.  You have a stiff, rigid neck.  You lose control of your bowels or your bladder (have incontinence).  You have trouble with walking, balance, or speaking. This information is not intended to replace advice given to you by your health care provider. Make sure you discuss any questions you have with your health care provider. Document Released: 10/03/2000 Document Revised: 06/16/2015 Document Reviewed: 03/04/2014 Elsevier Interactive Patient Education    2018 Elsevier Inc.  

## 2017-12-03 NOTE — Progress Notes (Signed)
Subjective:    CC: Follow-up  HPI: Left hand pain: Pain at the left third PIP, previous injection was 7 months ago, pain is recurrent, moderate, localized without radiation.  Neck pain: Right-sided, going from the neck and trapezius down to the second and third fingers.  Burning sensation.  No progressive weakness, no trauma, no constitutional symptoms.  I reviewed the past medical history, family history, social history, surgical history, and allergies today and no changes were needed.  Please see the problem list section below in epic for further details.  Past Medical History: Past Medical History:  Diagnosis Date  . Arthritis   . Hypertension   . PCOS (polycystic ovarian syndrome)   . Thyroid disease   . TIA (transient ischemic attack)    Past Surgical History: Past Surgical History:  Procedure Laterality Date  . 2 ectopic pregnancies  [id number]  . lymph nodes    . melanoma removal     RT SIDE  . removal of fallopian tube  1999   Social History: Social History   Socioeconomic History  . Marital status: Married    Spouse name: Herbie Baltimore  . Number of children: 3  . Years of education: Not on file  . Highest education level: Not on file  Occupational History  . 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   Social Needs  . Financial resource strain: Not on file  . Food insecurity:    Worry: Not on file    Inability: Not on file  . Transportation needs:    Medical: Not on file    Non-medical: Not on file  Tobacco Use  . Smoking status: Never Smoker  . Smokeless tobacco: Never Used  Substance and Sexual Activity  . Alcohol use: No  . Drug use: No  . Sexual activity: Yes    Partners: Male    Comment: adopted  Lifestyle  . Physical activity:    Days per week: Not on file    Minutes per session: Not on file  . Stress: Not on file  Relationships  . Social connections:    Talks on phone: Not on file    Gets  together: Not on file    Attends religious service: Not on file    Active member of club or organization: Not on file    Attends meetings of clubs or organizations: Not on file    Relationship status: Not on file  Other Topics Concern  . Not on file  Social History Narrative   Caffeine daily. No regular exercise.  She was adopted.    Family History: Family History  Problem Relation Age of Onset  . Melanoma Mother   . Depression Mother   . Hyperlipidemia Mother   . Colon cancer Maternal Grandmother   . Colon cancer Father 11       deceased.    Allergies: Allergies  Allergen Reactions  . Cymbalta [Duloxetine Hcl] Other (See Comments)    Jolting/jerking sensations.   . Sulfonamide Derivatives Hives  . Tape Rash    Use paper tape   Medications: See med rec.  Review of Systems: No fevers, chills, night sweats, weight loss, chest pain, or shortness of breath.   Objective:    General: Well Developed, well nourished, and in no acute distress.  Neuro: Alert and oriented x3, extra-ocular muscles intact, sensation grossly intact.  HEENT: Normocephalic, atraumatic, pupils equal round reactive to light, neck supple, no  masses, no lymphadenopathy, thyroid nonpalpable.  Skin: Warm and dry, no rashes. Cardiac: Regular rate and rhythm, no murmurs rubs or gallops, no lower extremity edema.  Respiratory: Clear to auscultation bilaterally. Not using accessory muscles, speaking in full sentences. Left hand: Fusiform swelling at the PIP of the third digit, tender to palpation here.  Procedure: Real-time Ultrasound Guided Injection of left third PIP Device: GE Logiq E  Verbal informed consent obtained.  Time-out conducted.  Noted no overlying erythema, induration, or other signs of local infection.  Skin prepped in a sterile fashion.  Local anesthesia: Topical Ethyl chloride.  With sterile technique and under real time ultrasound guidance: 1/2 cc kenalog 40, 1/2 cc lidocaine injected  easily Completed without difficulty  Pain immediately resolved suggesting accurate placement of the medication.  Advised to call if fevers/chills, erythema, induration, drainage, or persistent bleeding.  Images permanently stored and available for review in the ultrasound unit.  Impression: Technically successful ultrasound guided injection.  Impression and Recommendations:    Polyarthritis with left third PIP osteoarthritis Previous injection was 7 months ago, repeat left third PIP injection, return as needed.  Radiculitis of right cervical region Failed greater than 6 weeks of physician directed conservative measures, right C7 distribution radiculitis. Burst of prednisone today, x-rays, MRI. ___________________________________________ Gwen Her. Dianah Field, M.D., ABFM., CAQSM. Primary Care and Sports Medicine [facility]  Adjunct Professor of [employer]

## 2017-12-04 ENCOUNTER — Encounter: Payer: Self-pay | Admitting: Sports Medicine

## 2017-12-05 DIAGNOSIS — M542 Cervicalgia: Secondary | ICD-10-CM | POA: Diagnosis not present

## 2017-12-05 DIAGNOSIS — G8929 Other chronic pain: Secondary | ICD-10-CM | POA: Diagnosis not present

## 2017-12-05 DIAGNOSIS — R29898 Other symptoms and signs involving the musculoskeletal system: Secondary | ICD-10-CM | POA: Diagnosis not present

## 2017-12-23 ENCOUNTER — Ambulatory Visit (INDEPENDENT_AMBULATORY_CARE_PROVIDER_SITE_OTHER): Payer: BLUE CROSS/BLUE SHIELD

## 2017-12-23 ENCOUNTER — Encounter: Payer: Self-pay | Admitting: Sports Medicine

## 2017-12-23 DIAGNOSIS — M47892 Other spondylosis, cervical region: Secondary | ICD-10-CM | POA: Diagnosis not present

## 2017-12-23 DIAGNOSIS — M4802 Spinal stenosis, cervical region: Secondary | ICD-10-CM | POA: Diagnosis not present

## 2017-12-23 DIAGNOSIS — M50122 Cervical disc disorder at C5-C6 level with radiculopathy: Secondary | ICD-10-CM

## 2017-12-23 DIAGNOSIS — M50223 Other cervical disc displacement at C6-C7 level: Secondary | ICD-10-CM | POA: Diagnosis not present

## 2017-12-23 DIAGNOSIS — E039 Hypothyroidism, unspecified: Secondary | ICD-10-CM | POA: Diagnosis not present

## 2017-12-23 DIAGNOSIS — M5412 Radiculopathy, cervical region: Secondary | ICD-10-CM

## 2017-12-23 NOTE — Telephone Encounter (Signed)
Epidural orders placed, please contact [facility] for scheduling.

## 2017-12-24 LAB — TSH: TSH: 0.85 mIU/L

## 2017-12-24 NOTE — Telephone Encounter (Signed)
Carly [name] at [facility] notified.

## 2017-12-25 ENCOUNTER — Encounter: Payer: Self-pay | Admitting: Family Medicine

## 2017-12-27 ENCOUNTER — Encounter: Payer: Self-pay | Admitting: Physician Assistant

## 2017-12-27 ENCOUNTER — Ambulatory Visit (INDEPENDENT_AMBULATORY_CARE_PROVIDER_SITE_OTHER): Payer: BLUE CROSS/BLUE SHIELD | Admitting: Physician Assistant

## 2017-12-27 VITALS — BP 112/74 | HR 79 | Ht 64.0 in | Wt 143.0 lb

## 2017-12-27 DIAGNOSIS — L989 Disorder of the skin and subcutaneous tissue, unspecified: Secondary | ICD-10-CM

## 2017-12-27 DIAGNOSIS — Z85820 Personal history of malignant melanoma of skin: Secondary | ICD-10-CM | POA: Diagnosis not present

## 2017-12-27 DIAGNOSIS — R591 Generalized enlarged lymph nodes: Secondary | ICD-10-CM | POA: Diagnosis not present

## 2017-12-27 LAB — CBC WITH DIFFERENTIAL/PLATELET
BASOS PCT: 1.3 %
Basophils Absolute: 79 cells/uL (ref 0–200)
Eosinophils Absolute: 220 cells/uL (ref 15–500)
Eosinophils Relative: 3.6 %
HCT: 35.1 % (ref 35.0–45.0)
Hemoglobin: 11.8 g/dL (ref 11.7–15.5)
Lymphs Abs: 1464 cells/uL (ref 850–3900)
MCH: 30.7 pg (ref 27.0–33.0)
MCHC: 33.6 g/dL (ref 32.0–36.0)
MCV: 91.4 fL (ref 80.0–100.0)
MPV: 9.7 fL (ref 7.5–12.5)
Monocytes Relative: 8.5 %
NEUTROS ABS: 3819 {cells}/uL (ref 1500–7800)
Neutrophils Relative %: 62.6 %
PLATELETS: 349 10*3/uL (ref 140–400)
RBC: 3.84 10*6/uL (ref 3.80–5.10)
RDW: 12.2 % (ref 11.0–15.0)
TOTAL LYMPHOCYTE: 24 %
WBC: 6.1 10*3/uL (ref 3.8–10.8)
WBCMIX: 519 {cells}/uL (ref 200–950)

## 2017-12-27 MED ORDER — DOXYCYCLINE HYCLATE 100 MG PO TABS: 100.0000 mg | ORAL_TABLET | Freq: Two times a day (BID) | ORAL | 0 refills | Status: DC

## 2017-12-27 NOTE — Progress Notes (Signed)
   Subjective:    Patient ID: Carly [name], female    DOB: Dec 13, 1970, 47 y.o.   MRN: [id number]  HPI  Pt is a 47 yo female who presents to the clinic with 2 painful nodules on the left side of her body noticed for the last 4 days. She denies any ear pain, sinus pressure, ST, cough, headache. She does have a tender spot on the left parietal  to touch. She noticed it after her glasses that she start wearning started pushing on it. She is concerned because she had melanoma in 2003/2004 and lymph nodes on the left side of her body removed.   Started wearing new glasses 2 weeks ago. Has not gotten them fitted yet. Has appointment w/ eye doctor next week.  .. Active Ambulatory Problems    Diagnosis Date Noted  . Hypothyroid 11/01/2010  . PCOS (polycystic ovarian syndrome) 11/01/2010  . Chronic right SI joint pain 11/01/2010  . Polyarthritis with left third PIP osteoarthritis 11/01/2010  . Melanoma ([place]) 11/01/2010  . Essential hypertension, benign 11/01/2010  . ANEMIA, IRON DEFICIENCY 10/17/2010  . INSOMNIA, CHRONIC 10/17/2010  . FATIGUE 10/17/2010  . EDEMA 10/17/2010  . Major depression, recurrent ([place]) 12/14/2011  . Migraine 02/13/2012  . Lumbar spondylosis 04/02/2013  . Myofascial pain 08/03/2013  . Recurrent UTI 05/06/2014  . DUB (dysfunctional uterine bleeding) 05/06/2014  . SOB (shortness of breath) 05/31/2014  . Other fatigue 05/31/2014  . Tachycardia 05/31/2014  . Abnormal weight gain 01/20/2015  . Asthma, mild persistent 10/04/2015  . Stress fracture of metatarsal bone of right foot 05/24/2017  . Radiculitis of right cervical region 12/03/2017  . Lymphadenopathy 12/29/2017  . History of melanoma 12/29/2017  . Skin sore 12/29/2017   Resolved Ambulatory Problems    Diagnosis Date Noted  . HYPOTHYROIDISM 09/30/2010  . Laryngitis 07/05/2015   Past Medical History:  Diagnosis Date  . Arthritis   . Hypertension   . Thyroid disease   . TIA (transient ischemic attack)        Review of Systems See HPI.     Objective:   Physical Exam  Constitutional: She is oriented to person, place, and time. She appears well-developed and well-nourished.  HENT:  Head: Normocephalic and atraumatic.  Neck: Normal range of motion.    Cardiovascular: Normal rate and regular rhythm.  Pulmonary/Chest: Effort normal and breath sounds normal.  Lymphadenopathy:    She has cervical adenopathy.  Neurological: She is alert and oriented to person, place, and time.  Skin:  Left raised firm nodule with erythema very tender to palpation. .44mm vs .26mm. No pustule.  Psychiatric: She has a normal mood and affect. Her behavior is normal.          Assessment & Plan:  Marland KitchenMarland KitchenDiagnoses and all orders for this visit:  Lymphadenopathy -     doxycycline (VIBRA-TABS) 100 MG tablet; Take 1 tablet (100 mg total) by mouth 2 (two) times daily. -     CBC with Differential/Platelet  History of melanoma  Skin sore -     doxycycline (VIBRA-TABS) 100 MG tablet; Take 1 tablet (100 mg total) by mouth 2 (two) times daily.   No signs of URI. She does have the sore, red nodule on skull on the left side. Could be an ingrown hair/infected bug bite. Treated with doxy. Use warm compresses. If lymph nodes are not improving in the next 2 weeks then need to get imaging of nodes due to hx of melanoma. CBC ordered today.

## 2017-12-27 NOTE — Patient Instructions (Signed)
Start antibiotic.  Get CBC. Warm compresses on head sore.

## 2017-12-29 ENCOUNTER — Encounter: Payer: Self-pay | Admitting: Physician Assistant

## 2017-12-29 DIAGNOSIS — R591 Generalized enlarged lymph nodes: Secondary | ICD-10-CM | POA: Insufficient documentation

## 2017-12-29 DIAGNOSIS — L989 Disorder of the skin and subcutaneous tissue, unspecified: Secondary | ICD-10-CM | POA: Insufficient documentation

## 2017-12-29 DIAGNOSIS — Z85820 Personal history of malignant melanoma of skin: Secondary | ICD-10-CM | POA: Insufficient documentation

## 2017-12-29 NOTE — Progress Notes (Signed)
Call pt: CBC looks perfect.

## 2017-12-30 ENCOUNTER — Encounter: Payer: Self-pay | Admitting: Emergency Medicine

## 2017-12-30 ENCOUNTER — Encounter: Payer: Self-pay | Admitting: Physician Assistant

## 2017-12-30 ENCOUNTER — Emergency Department
Admission: EM | Admit: 2017-12-30 | Discharge: 2017-12-30 | Disposition: A | Discharge status: Home / Self Care | Payer: BLUE CROSS/BLUE SHIELD | Source: Home / Self Care | Attending: Family Medicine | Admitting: Family Medicine

## 2017-12-30 DIAGNOSIS — R591 Generalized enlarged lymph nodes: Secondary | ICD-10-CM

## 2017-12-30 DIAGNOSIS — R51 Headache: Secondary | ICD-10-CM

## 2017-12-30 DIAGNOSIS — R519 Headache, unspecified: Secondary | ICD-10-CM

## 2017-12-30 DIAGNOSIS — R21 Rash and other nonspecific skin eruption: Secondary | ICD-10-CM

## 2017-12-30 MED ORDER — VALACYCLOVIR HCL 1 G PO TABS: 1000.0000 mg | ORAL_TABLET | Freq: Three times a day (TID) | ORAL | 0 refills | Status: DC

## 2017-12-30 NOTE — ED Provider Notes (Signed)
[facility]    CSN: [id number] Arrival date & time: 12/30/17  1712     History   Chief Complaint Chief Complaint  Patient presents with  . Rash    HPI Carly [name] is a 48 y.o. female.   HPI  Carly [name] is a 47 y.o. female presenting to UC with c/o burning painful lesion on the Left side of her scalp that she noticed last week with associated swollen lymph nodes on the Left side of her head and neck. She was seen by her PCP who started her on doxycycline for potential bacterial infection.  Two days ago she woke with her Left eye being red and swollen "it looked like I was punched in the eye." the redness and swelling has nearly resolved after applying warm compresses but when she mentioned the sore on her scalp today to her dentist, he encouraged her to be reevaluated for potential shingles. Denies fever or chills. She did have a mild case of chicken pox when she was young.  She also notes she had a severe stomach virus with vomiting, diarrhea and fever just before Thanksgiving.  Those symptoms resolved within 2-3 days.  She has also been under some increased stress.  Denies any other rashes.        Past Medical History:  Diagnosis Date  . Arthritis   . Hypertension   . PCOS (polycystic ovarian syndrome)   . Thyroid disease   . TIA (transient ischemic attack)     Patient Active Problem List   Diagnosis Date Noted  . Lymphadenopathy 12/29/2017  . History of melanoma 12/29/2017  . Skin sore 12/29/2017  . Radiculitis of right cervical region 12/03/2017  . Stress fracture of metatarsal bone of right foot 05/24/2017  . Asthma, mild persistent 10/04/2015  . Abnormal weight gain 01/20/2015  . SOB (shortness of breath) 05/31/2014  . Other fatigue 05/31/2014  . Tachycardia 05/31/2014  . Recurrent UTI 05/06/2014  . DUB (dysfunctional uterine bleeding) 05/06/2014  . Myofascial pain 08/03/2013  . Lumbar spondylosis 04/02/2013  . Migraine 02/13/2012  . Major  depression, recurrent ([place]) 12/14/2011  . Hypothyroid 11/01/2010  . PCOS (polycystic ovarian syndrome) 11/01/2010  . Chronic right SI joint pain 11/01/2010  . Polyarthritis with left third PIP osteoarthritis 11/01/2010  . Melanoma ([place]) 11/01/2010  . Essential hypertension, benign 11/01/2010  . ANEMIA, IRON DEFICIENCY 10/17/2010  . INSOMNIA, CHRONIC 10/17/2010  . FATIGUE 10/17/2010  . EDEMA 10/17/2010    Past Surgical History:  Procedure Laterality Date  . 2 ectopic pregnancies  2229,7989  . lymph nodes    . melanoma removal     RT SIDE  . removal of fallopian tube  1999    OB History   None      Home Medications    Prior to Admission medications   Medication Sig Start Date End Date Taking? Authorizing Provider  albuterol (PROVENTIL HFA;VENTOLIN HFA) 108 (90 Base) MCG/ACT inhaler Inhale 2 puffs into the lungs every 4 (four) hours as needed for wheezing or shortness of breath (bronchospasm). 06/18/17   Trixie Dredge, PA-C  Calcium Carbonate-Vitamin D 600-400 MG-UNIT tablet Take 1 tablet by mouth 2 (two) times daily. 05/24/17   Silverio Decamp, MD  doxycycline (VIBRA-TABS) 100 MG tablet Take 1 tablet (100 mg total) by mouth 2 (two) times daily. 12/27/17   Breeback, Jade L, PA-C  fluticasone (FLONASE) 50 MCG/ACT nasal spray Place 2 sprays into the nose daily as needed. For seasonal allergy nasal  congestion 05/02/12   Hali Marry, MD  gabapentin (NEURONTIN) 300 MG capsule TAKE 1 CAPSULE BY MOUTH AT BEDTIME FOR 7 DAYS THEN 1 CAPSULE TWICE DAILY FOR 7 DAYS THEN 1 CAPSULE THREE TIMES DAILY 10/07/17   Silverio Decamp, MD  levonorgestrel (MIRENA) 20 MCG/24HR IUD 1 Intra Uterine Device (1 each total) by Intrauterine route once. Inserted 10/19/14 10/20/14   Hali Marry, MD  losartan-hydrochlorothiazide (HYZAAR) 50-12.5 MG tablet Take 0.5 tablets by mouth daily. 11/18/17   Hali Marry, MD  metFORMIN (GLUCOPHAGE) 1000 MG tablet TAKE 1 TABLET  BY MOUTH TWICE DAILY WITH MEALS 09/30/17   Hali Marry, MD  Olopatadine HCl 0.2 % SOLN Apply 1 drop to eye daily. 04/12/17   Hali Marry, MD  omeprazole (PRILOSEC) 40 MG capsule TAKE 1 CAPSULE BY MOUTH TWICE DAILY 12/02/17   Hali Marry, MD  predniSONE (DELTASONE) 50 MG tablet One tab PO daily for 5 days. 12/03/17   Silverio Decamp, MD  spironolactone (ALDACTONE) 25 MG tablet TAKE 1 TABLET BY MOUTH ONCE DAILY 04/12/17   Hali Marry, MD  SYNTHROID 137 MCG tablet Take 1 tablet (137 mcg total) by mouth daily before breakfast. 10/18/17   Hali Marry, MD  valACYclovir (VALTREX) 1000 MG tablet Take 1 tablet (1,000 mg total) by mouth 3 (three) times daily. 12/30/17   Noe Gens, PA-C  zolpidem (AMBIEN) 10 MG tablet TAKE 1 TABLET BY MOUTH AT BEDTIME 10/07/17   Hali Marry, MD    Family History Family History  Problem Relation Age of Onset  . Melanoma Mother   . Depression Mother   . Hyperlipidemia Mother   . Colon cancer Maternal Grandmother   . Colon cancer Father 20       deceased.     Social History Social History   Tobacco Use  . Smoking status: Never Smoker  . Smokeless tobacco: Never Used  Substance Use Topics  . Alcohol use: No  . Drug use: No     Allergies   Cymbalta [duloxetine hcl]; Sulfonamide derivatives; and Tape   Review of Systems Review of Systems  Constitutional: Negative for chills and fever.  HENT: Positive for facial swelling (under Left eye). Negative for congestion, ear pain, sore throat, trouble swallowing and voice change.   Eyes: Positive for pain (mild soreness under eye) and redness. Negative for photophobia and visual disturbance.  Respiratory: Negative for cough and shortness of breath.   Cardiovascular: Negative for chest pain and palpitations.  Gastrointestinal: Negative for abdominal pain, diarrhea, nausea and vomiting.  Musculoskeletal: Negative for arthralgias, back pain and  myalgias.  Skin: Positive for color change and rash.  Neurological: Negative for dizziness and headaches.     Physical Exam Triage Vital Signs ED Triage Vitals  Enc Vitals Group     BP 12/30/17 1741 118/82     Pulse Rate 12/30/17 1741 74     Resp --      Temp 12/30/17 1741 98 F (36.7 C)     Temp Source 12/30/17 1741 Oral     SpO2 12/30/17 1741 99 %     Weight 12/30/17 1742 145 lb (65.8 kg)     Height --      Head Circumference --      Peak Flow --      Pain Score 12/30/17 1741 5     Pain Loc --      Pain Edu? --      Excl.  in [place]? --    No data found.  Updated Vital Signs BP 118/82 (BP Location: Right Arm)   Pulse 74   Temp 98 F (36.7 C) (Oral)   Wt 145 lb (65.8 kg)   SpO2 99%   BMI 24.89 kg/m   Visual Acuity Right Eye Distance:   Left Eye Distance:   Bilateral Distance:    Right Eye Near:   Left Eye Near:    Bilateral Near:     Physical Exam  Constitutional: She is oriented to person, place, and time. She appears well-developed and well-nourished. No distress.  HENT:  Head: Normocephalic and atraumatic.    Right Ear: Tympanic membrane normal.  Left Ear: Tympanic membrane normal.  Nose: Nose normal. Right sinus exhibits no maxillary sinus tenderness and no frontal sinus tenderness. Left sinus exhibits no maxillary sinus tenderness and no frontal sinus tenderness.  Mouth/Throat: Uvula is midline, oropharynx is clear and moist and mucous membranes are normal.  Left side of scalp: 2-3cm erythematous tender macular lesion. No vesicles visualized, difficulty exposing lesion under pt's thick hair.   Eyes: Pupils are equal, round, and reactive to light. Conjunctivae and EOM are normal. Right eye exhibits no discharge. Left eye exhibits no discharge.    Left eye: faint erythema and mild edema under Left eye, minimally tender. No drainage.   Neck: Normal range of motion. Neck supple.  Cardiovascular: Normal rate and regular rhythm.  Pulmonary/Chest: Effort  normal and breath sounds normal. No stridor. No respiratory distress.  Musculoskeletal: Normal range of motion.  Lymphadenopathy:    She has cervical adenopathy.  Neurological: She is alert and oriented to person, place, and time.  Skin: Skin is warm and dry. She is not diaphoretic.  Psychiatric: She has a normal mood and affect. Her behavior is normal.  Nursing note and vitals reviewed.    UC Treatments / Results  Labs (all labs ordered are listed, but only abnormal results are displayed) Labs Reviewed - No data to display  EKG None  Radiology No results found.  Procedures Procedures (including critical care time)  Medications Ordered in UC Medications - No data to display  Initial Impression / Assessment and Plan / UC Course  I have reviewed the triage vital signs and the nursing notes.  Pertinent labs & imaging results that were available during my care of the patient were reviewed by me and considered in my medical decision making (see chart for details).     Possible mild case of shingles.   Will start pt on valtrex and encouraged to continue taking the doxycycline as this could have helped Left eye swelling resolve over the last few days. Encouraged close f/u with PCP later this week.   Final Clinical Impressions(s) / UC Diagnoses   Final diagnoses:  Rash and nonspecific skin eruption  Left-sided face pain  Lymphadenopathy of head and neck     Discharge Instructions      The painful rash on your scalp may be a mild case of shingles.  You may start taking the valtrex as prescribed to help shorten the duration and severity of your symptoms.  Please also continue to take the antibiotic, doxycycline, as prescribed to help cover for any underlying skin infection, especially around your eye.    Please call Jade's office to schedule a follow up appointment later this week for recheck of symptoms.     ED Prescriptions    Medication Sig Dispense Auth. Provider     valACYclovir (VALTREX)  1000 MG tablet Take 1 tablet (1,000 mg total) by mouth 3 (three) times daily. 21 tablet Noe Gens, PA-C     Controlled Substance Prescriptions [place] Controlled Substance Registry consulted? Not Applicable   Tyrell Antonio 12/30/17 2007

## 2017-12-30 NOTE — Discharge Instructions (Signed)
°  The painful rash on your scalp may be a mild case of shingles.  You may start taking the valtrex as prescribed to help shorten the duration and severity of your symptoms.  Please also continue to take the antibiotic, doxycycline, as prescribed to help cover for any underlying skin infection, especially around your eye.    Please call Jade's office to schedule a follow up appointment later this week for recheck of symptoms.

## 2017-12-30 NOTE — ED Triage Notes (Signed)
Pt states she was seen Friday and given doxy for swollen lymph nodes. Since then she has developed a rash on left side of her face and head. Rash is painful.

## 2018-01-01 ENCOUNTER — Other Ambulatory Visit: Payer: Self-pay

## 2018-01-02 ENCOUNTER — Encounter: Payer: Self-pay | Admitting: Sports Medicine

## 2018-01-02 ENCOUNTER — Ambulatory Visit (INDEPENDENT_AMBULATORY_CARE_PROVIDER_SITE_OTHER): Payer: BLUE CROSS/BLUE SHIELD | Admitting: Sports Medicine

## 2018-01-02 DIAGNOSIS — M5412 Radiculopathy, cervical region: Secondary | ICD-10-CM | POA: Diagnosis not present

## 2018-01-02 NOTE — Progress Notes (Signed)
Subjective:    CC: MRI results  HPI: This is a pleasant 47 year old female, she has been having right C7 radiculitis, failed conservative measures so we obtained an MRI the results of which will be dictated below.  She is scheduled for her cervical epidural.  I reviewed the past medical history, family history, social history, surgical history, and allergies today and no changes were needed.  Please see the problem list section below in epic for further details.  Past Medical History: Past Medical History:  Diagnosis Date  . Arthritis   . Hypertension   . PCOS (polycystic ovarian syndrome)   . Thyroid disease   . TIA (transient ischemic attack)    Past Surgical History: Past Surgical History:  Procedure Laterality Date  . 2 ectopic pregnancies  0932,3557  . lymph nodes    . melanoma removal     RT SIDE  . removal of fallopian tube  1999   Social History: Social History   Socioeconomic History  . Marital status: Married    Spouse name: Herbie [place]  . Number of children: 3  . Years of education: Not on file  . Highest education level: Not on file  Occupational History  . Occupation: Education officer, museum [place]    Employer: [employer]: [employer].   Social Needs  . Financial resource strain: Not on file  . Food insecurity:    Worry: Not on file    Inability: Not on file  . Transportation needs:    Medical: Not on file    Non-medical: Not on file  Tobacco Use  . Smoking status: Never Smoker  . Smokeless tobacco: Never Used  Substance and Sexual Activity  . Alcohol use: No  . Drug use: No  . Sexual activity: Yes    Partners: Male    Comment: adopted  Lifestyle  . Physical activity:    Days per week: Not on file    Minutes per session: Not on file  . Stress: Not on file  Relationships  . Social connections:    Talks on phone: Not on file    Gets together: Not on file    Attends religious service: Not on file    Active member of club or  organization: Not on file    Attends meetings of clubs or organizations: Not on file    Relationship status: Not on file  Other Topics Concern  . Not on file  Social History Narrative   Caffeine daily. No regular exercise.  She was adopted.    Family History: Family History  Problem Relation Age of Onset  . Melanoma Mother   . Depression Mother   . Hyperlipidemia Mother   . Colon cancer Maternal Grandmother   . Colon cancer Father 18       deceased.    Allergies: Allergies  Allergen Reactions  . Cymbalta [Duloxetine Hcl] Other (See Comments)    Jolting/jerking sensations.   . Sulfonamide Derivatives Hives  . Tape Rash    Use paper tape   Medications: See med rec.  Review of Systems: No fevers, chills, night sweats, weight loss, chest pain, or shortness of breath.   Objective:    General: Well Developed, well nourished, and in no acute distress.  Neuro: Alert and oriented x3, extra-ocular muscles intact, sensation grossly intact.  HEENT: Normocephalic, atraumatic, pupils equal round reactive to light, neck supple, no masses, no lymphadenopathy, thyroid nonpalpable.  Skin: Warm and dry, no rashes. Cardiac:  Regular rate and rhythm, no murmurs rubs or gallops, no lower extremity edema.  Respiratory: Clear to auscultation bilaterally. Not using accessory muscles, speaking in full sentences.  MRI personally reviewed, C5-C6 large disc extrusion with central canal stenosis as well as mild bi-foraminal stenosis, she also has a smaller disc protrusion at C6-C7 without any overt central or foraminal stenosis.  Impression and Recommendations:    Radiculitis of right cervical region MRI confirms multilevel cervical DDD, epidural scheduled. She does have possible shingles, taking gabapentin 3 times daily, she will increase her gabapentin to 2 tabs 3 times daily in the meantime. ___________________________________________ Gwen Her. Dianah Field, M.D., ABFM., CAQSM. Primary Care and  Sports Medicine [place]  Adjunct Professor of [employer] [facility] [employer]

## 2018-01-02 NOTE — Assessment & Plan Note (Signed)
MRI confirms multilevel cervical DDD, epidural scheduled. She does have possible shingles, taking gabapentin 3 times daily, she will increase her gabapentin to 2 tabs 3 times daily in the meantime.

## 2018-01-14 ENCOUNTER — Ambulatory Visit
Admission: RE | Admit: 2018-01-14 | Discharge: 2018-01-14 | Disposition: A | Discharge status: Home / Self Care | Payer: BLUE CROSS/BLUE SHIELD | Source: Ambulatory Visit | Attending: Sports Medicine | Admitting: Sports Medicine

## 2018-01-14 ENCOUNTER — Encounter: Payer: Self-pay | Admitting: Radiology

## 2018-01-14 DIAGNOSIS — M5412 Radiculopathy, cervical region: Secondary | ICD-10-CM | POA: Diagnosis not present

## 2018-01-14 MED ORDER — TRIAMCINOLONE ACETONIDE 40 MG/ML IJ SUSP (RADIOLOGY)
60.0000 mg | Freq: Once | INTRAMUSCULAR | Status: AC
  Administered 2018-01-14: 60 mg via EPIDURAL

## 2018-01-14 MED ORDER — IOPAMIDOL (ISOVUE-M 300) INJECTION 61%
1.0000 mL | Freq: Once | INTRAMUSCULAR | Status: AC | PRN
  Administered 2018-01-14: 1 mL via EPIDURAL

## 2018-01-14 NOTE — Discharge Instructions (Signed)

## 2018-01-18 ENCOUNTER — Other Ambulatory Visit: Payer: Self-pay | Admitting: Sports Medicine

## 2018-01-18 DIAGNOSIS — M47816 Spondylosis without myelopathy or radiculopathy, lumbar region: Secondary | ICD-10-CM

## 2018-02-02 ENCOUNTER — Other Ambulatory Visit: Payer: Self-pay | Admitting: Family Medicine

## 2018-02-13 DIAGNOSIS — D509 Iron deficiency anemia, unspecified: Secondary | ICD-10-CM | POA: Diagnosis not present

## 2018-02-17 ENCOUNTER — Ambulatory Visit: Payer: Self-pay | Admitting: Sports Medicine

## 2018-02-20 ENCOUNTER — Other Ambulatory Visit: Payer: Self-pay | Admitting: Sports Medicine

## 2018-02-20 DIAGNOSIS — M47816 Spondylosis without myelopathy or radiculopathy, lumbar region: Secondary | ICD-10-CM

## 2018-02-23 ENCOUNTER — Encounter: Payer: Self-pay | Admitting: Family Medicine

## 2018-02-23 ENCOUNTER — Encounter: Payer: Self-pay | Admitting: Sports Medicine

## 2018-02-24 ENCOUNTER — Encounter: Payer: Self-pay | Admitting: Sports Medicine

## 2018-02-26 ENCOUNTER — Ambulatory Visit (INDEPENDENT_AMBULATORY_CARE_PROVIDER_SITE_OTHER): Payer: BLUE CROSS/BLUE SHIELD | Admitting: Physician Assistant

## 2018-02-26 ENCOUNTER — Encounter: Payer: Self-pay | Admitting: Physician Assistant

## 2018-02-26 VITALS — BP 123/81 | HR 93 | Ht 64.0 in | Wt 143.0 lb

## 2018-02-26 DIAGNOSIS — E039 Hypothyroidism, unspecified: Secondary | ICD-10-CM | POA: Diagnosis not present

## 2018-02-26 DIAGNOSIS — R252 Cramp and spasm: Secondary | ICD-10-CM | POA: Diagnosis not present

## 2018-02-26 DIAGNOSIS — R002 Palpitations: Secondary | ICD-10-CM

## 2018-02-26 DIAGNOSIS — E538 Deficiency of other specified B group vitamins: Secondary | ICD-10-CM | POA: Diagnosis not present

## 2018-02-26 DIAGNOSIS — R5383 Other fatigue: Secondary | ICD-10-CM

## 2018-02-26 NOTE — Progress Notes (Signed)
Subjective:    Patient ID: Carly [name], female    DOB: 14-Jun-1970, 48 y.o.   MRN: [id number]  HPI  Pt is a 48 yo female with hx of hypothyroidism, b12 deficiency and anemia who presents to the clinic with palpitations, bruising, fatigue, tachycardia, muscle cramps, anxiety. She was seen by her hematologist because she thought she was anemic. Labs done and hgb looks great. She feels like it could be her thyroid. Last check was 2 months ago and in range. She has been hyper thyroid before and felt like this.   Not been sick recently. She has been bruising easily.   .. Active Ambulatory Problems    Diagnosis Date Noted  . Hypothyroid 11/01/2010  . PCOS (polycystic ovarian syndrome) 11/01/2010  . Chronic right SI joint pain 11/01/2010  . Polyarthritis with left third PIP osteoarthritis 11/01/2010  . Melanoma ([place]) 11/01/2010  . Essential hypertension, benign 11/01/2010  . ANEMIA, IRON DEFICIENCY 10/17/2010  . INSOMNIA, CHRONIC 10/17/2010  . FATIGUE 10/17/2010  . EDEMA 10/17/2010  . Major depression, recurrent ([place]) 12/14/2011  . Migraine 02/13/2012  . Lumbar spondylosis 04/02/2013  . Myofascial pain 08/03/2013  . Recurrent UTI 05/06/2014  . DUB (dysfunctional uterine bleeding) 05/06/2014  . SOB (shortness of breath) 05/31/2014  . Other fatigue 05/31/2014  . Tachycardia 05/31/2014  . Abnormal weight gain 01/20/2015  . Asthma, mild persistent 10/04/2015  . Stress fracture of metatarsal bone of right foot 05/24/2017  . Radiculitis of right cervical region 12/03/2017  . Lymphadenopathy 12/29/2017  . History of melanoma 12/29/2017  . Skin sore 12/29/2017   Resolved Ambulatory Problems    Diagnosis Date Noted  . HYPOTHYROIDISM 09/30/2010  . Laryngitis 07/05/2015   Past Medical History:  Diagnosis Date  . Arthritis   . Hypertension   . Thyroid disease   . TIA (transient ischemic attack)        Review of Systems See HPI.     Objective:   Physical Exam Vitals  signs reviewed.  Constitutional:      Appearance: Normal appearance.  HENT:     Head: Normocephalic and atraumatic.  Neck:     Musculoskeletal: Normal range of motion. No muscular tenderness.  Cardiovascular:     Rate and Rhythm: Normal rate and regular rhythm.  Pulmonary:     Effort: Pulmonary effort is normal.  Skin:    Findings: Bruising present.  Neurological:     General: No focal deficit present.     Mental Status: She is alert and oriented to person, place, and time.  Psychiatric:        Mood and Affect: Mood normal.        Behavior: Behavior normal.           Assessment & Plan:  Marland KitchenMarland KitchenDiagnoses and all orders for this visit:  Palpitations -     TSH -     Magnesium -     BASIC METABOLIC PANEL WITH GFR -     B12 and Folate Panel  No energy -     TSH -     Magnesium -     BASIC METABOLIC PANEL WITH GFR -     B12 and Folate Panel  Muscle cramps -     TSH -     Magnesium -     BASIC METABOLIC PANEL WITH GFR -     B12 and Folate Panel  B12 deficiency -     B12 and Folate Panel  Acquired hypothyroidism -  TSH   Will check labs and adjust medications accordingly. Due to patients hx likely thyroid related. Vitals reassuring with PE.

## 2018-02-27 ENCOUNTER — Encounter: Payer: Self-pay | Admitting: Physician Assistant

## 2018-02-27 DIAGNOSIS — E538 Deficiency of other specified B group vitamins: Secondary | ICD-10-CM | POA: Insufficient documentation

## 2018-02-27 LAB — BASIC METABOLIC PANEL WITH GFR
BUN: 18 mg/dL (ref 7–25)
CALCIUM: 9.4 mg/dL (ref 8.6–10.2)
CO2: 27 mmol/L (ref 20–32)
Chloride: 100 mmol/L (ref 98–110)
Creat: 1.03 mg/dL (ref 0.50–1.10)
GFR, EST NON AFRICAN AMERICAN: 64 mL/min/{1.73_m2} (ref >=60)
GFR, Est African American: 74 mL/min/{1.73_m2} (ref >=60)
GLUCOSE: 82 mg/dL (ref 65–99)
POTASSIUM: 3.9 mmol/L (ref 3.5–5.3)
Sodium: 138 mmol/L (ref 135–146)

## 2018-02-27 LAB — TSH: TSH: 0.06 mIU/L — ABNORMAL LOW

## 2018-02-27 LAB — MAGNESIUM: Magnesium: 1.6 mg/dL (ref 1.5–2.5)

## 2018-02-27 LAB — B12 AND FOLATE PANEL
FOLATE: 6.4 ng/mL
VITAMIN B 12: 336 pg/mL (ref 200–1100)

## 2018-02-27 NOTE — Progress Notes (Signed)
Ok to send her both rx with 1 refill. Recheck in 4 weeks. b12 on low normal. Increase to every 2 weeks for injection.

## 2018-02-27 NOTE — Progress Notes (Signed)
Call pt: you were right on. You are very hyperthyroid. Since 168mcg was not enough and 130mcg is too much we could do a combination.   2 options:  Alternate 137 and 125 and recheck in 4-6 weeks. OR You could take 155mcg two days a week take one and one half tablet.   b12 also low how often and much are you taking the b12 shot.   What do you think?

## 2018-02-28 ENCOUNTER — Other Ambulatory Visit: Payer: Self-pay | Admitting: Physician Assistant

## 2018-02-28 MED ORDER — LEVOTHYROXINE SODIUM 125 MCG PO TABS: 125.0000 ug | ORAL_TABLET | Freq: Every day | ORAL | 0 refills | Status: DC

## 2018-03-23 ENCOUNTER — Other Ambulatory Visit: Payer: Self-pay | Admitting: Sports Medicine

## 2018-03-23 ENCOUNTER — Other Ambulatory Visit: Payer: Self-pay | Admitting: Family Medicine

## 2018-03-23 DIAGNOSIS — M47816 Spondylosis without myelopathy or radiculopathy, lumbar region: Secondary | ICD-10-CM

## 2018-03-24 NOTE — Telephone Encounter (Signed)
Last RX sent 10/07/17 for #90 with 1 RF   RX pended, please review and send if appropriate

## 2018-03-28 ENCOUNTER — Ambulatory Visit (INDEPENDENT_AMBULATORY_CARE_PROVIDER_SITE_OTHER): Payer: BLUE CROSS/BLUE SHIELD | Admitting: Sports Medicine

## 2018-03-28 ENCOUNTER — Encounter: Payer: Self-pay | Admitting: Sports Medicine

## 2018-03-28 DIAGNOSIS — M130 Polyarthritis, unspecified: Secondary | ICD-10-CM | POA: Diagnosis not present

## 2018-03-28 DIAGNOSIS — M5412 Radiculopathy, cervical region: Secondary | ICD-10-CM | POA: Diagnosis not present

## 2018-03-28 NOTE — Assessment & Plan Note (Signed)
Left third PIP injection, previous injection was in November 2019.

## 2018-03-28 NOTE — Assessment & Plan Note (Signed)
Multilevel cervical DDD. Gabapentin seems to be working well, epidural in December also worked well. Return as needed.

## 2018-03-28 NOTE — Progress Notes (Signed)
Subjective:    CC: Hand pain  HPI: This is a pleasant 48 year old female, she has left third PIP osteoarthritis, she does have autoimmune disease and was on Plaquenil.  We have kept her symptoms under control with occasional left third PIP injections, the most recent of which was 4 months ago.  Now having recurrence of pain, moderate, persistent, localized radiation, agreeable to proceed with injection.  Cervical radiculitis: Much better with gabapentin.  I reviewed the past medical history, family history, social history, surgical history, and allergies today and no changes were needed.  Please see the problem list section below in epic for further details.  Past Medical History: Past Medical History:  Diagnosis Date  . Arthritis   . Hypertension   . PCOS (polycystic ovarian syndrome)   . Thyroid disease   . TIA (transient ischemic attack)    Past Surgical History: Past Surgical History:  Procedure Laterality Date  . 2 ectopic pregnancies  [id number]  . lymph nodes    . melanoma removal     RT SIDE  . removal of fallopian tube  1999   Social History: Social History   Socioeconomic History  . Marital status: Married    Spouse name: Herbie [place]  . Number of children: 3  . Years of education: Not on file  . Highest education level: Not on file  Occupational History  . 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   Social Needs  . Financial resource strain: Not on file  . Food insecurity:    Worry: Not on file    Inability: Not on file  . Transportation needs:    Medical: Not on file    Non-medical: Not on file  Tobacco Use  . Smoking status: Never Smoker  . Smokeless tobacco: Never Used  Substance and Sexual Activity  . Alcohol use: No  . Drug use: No  . Sexual activity: Yes    Partners: Male    Comment: adopted  Lifestyle  . Physical activity:    Days per week: Not on file    Minutes per session: Not on file  .  Stress: Not on file  Relationships  . Social connections:    Talks on phone: Not on file    Gets together: Not on file    Attends religious service: Not on file    Active member of club or organization: Not on file    Attends meetings of clubs or organizations: Not on file    Relationship status: Not on file  Other Topics Concern  . Not on file  Social History Narrative   Caffeine daily. No regular exercise.  She was adopted.    Family History: Family History  Problem Relation Age of Onset  . Melanoma Mother   . Depression Mother   . Hyperlipidemia Mother   . Colon cancer Maternal Grandmother   . Colon cancer Father 65       deceased.    Allergies: Allergies  Allergen Reactions  . Cymbalta [Duloxetine Hcl] Other (See Comments)    Jolting/jerking sensations.   . Sulfonamide Derivatives Hives  . Tape Rash    Use paper tape   Medications: See med rec.  Review of Systems: No fevers, chills, night sweats, weight loss, chest pain, or shortness of breath.   Objective:    General: Well Developed, well nourished, and in no acute distress.  Neuro: Alert and oriented x3, extra-ocular muscles intact,  sensation grossly intact.  HEENT: Normocephalic, atraumatic, pupils equal round reactive to light, neck supple, no masses, no lymphadenopathy, thyroid nonpalpable.  Skin: Warm and dry, no rashes. Cardiac: Regular rate and rhythm, no murmurs rubs or gallops, no lower extremity edema.  Respiratory: Clear to auscultation bilaterally. Not using accessory muscles, speaking in full sentences. Left hand: Swollen, tender third PIP.  Procedure: Real-time Ultrasound Guided injection of the left third PIP Device: GE Logiq E  Verbal informed consent obtained.  Time-out conducted.  Noted no overlying erythema, induration, or other signs of local infection.  Skin prepped in a sterile fashion.  Local anesthesia: Topical Ethyl chloride.  With sterile technique and under real time ultrasound  guidance:  1/2 cc Kenalog 40, 1/2 cc lidocaine injected easily Completed without difficulty  Pain immediately resolved suggesting accurate placement of the medication.  Advised to call if fevers/chills, erythema, induration, drainage, or persistent bleeding.  Images permanently stored and available for review in the ultrasound unit.  Impression: Technically successful ultrasound guided injection.  Impression and Recommendations:    Polyarthritis with left third PIP osteoarthritis Left third PIP injection, previous injection was in November 2019.  Radiculitis of right cervical region Multilevel cervical DDD. Gabapentin seems to be working well, epidural in December also worked well. Return as needed. ___________________________________________ Gwen Her. Dianah Field, M.D., ABFM., CAQSM. Primary Care and Sports Medicine [place] [facility]  Adjunct Professor of [employer] [facility] [employer]

## 2018-04-14 ENCOUNTER — Encounter: Payer: Self-pay | Admitting: Family Medicine

## 2018-04-16 ENCOUNTER — Encounter: Payer: Self-pay | Admitting: Family Medicine

## 2018-04-19 ENCOUNTER — Encounter: Payer: Self-pay | Admitting: Family Medicine

## 2018-04-19 DIAGNOSIS — D510 Vitamin B12 deficiency anemia due to intrinsic factor deficiency: Secondary | ICD-10-CM

## 2018-04-21 MED ORDER — CYANOCOBALAMIN 1000 MCG/ML IJ SOLN: 1000.0000 ug | INTRAMUSCULAR | 0 refills | Status: DC

## 2018-04-22 ENCOUNTER — Other Ambulatory Visit: Payer: Self-pay | Admitting: Family Medicine

## 2018-04-22 ENCOUNTER — Other Ambulatory Visit: Payer: Self-pay | Admitting: Sports Medicine

## 2018-04-22 DIAGNOSIS — M47816 Spondylosis without myelopathy or radiculopathy, lumbar region: Secondary | ICD-10-CM

## 2018-05-11 ENCOUNTER — Other Ambulatory Visit: Payer: Self-pay | Admitting: Family Medicine

## 2018-05-14 ENCOUNTER — Ambulatory Visit (INDEPENDENT_AMBULATORY_CARE_PROVIDER_SITE_OTHER): Payer: BLUE CROSS/BLUE SHIELD | Admitting: Sports Medicine

## 2018-05-14 ENCOUNTER — Encounter: Payer: Self-pay | Admitting: Sports Medicine

## 2018-05-14 DIAGNOSIS — M47816 Spondylosis without myelopathy or radiculopathy, lumbar region: Secondary | ICD-10-CM | POA: Diagnosis not present

## 2018-05-14 MED ORDER — METHYLPREDNISOLONE SODIUM SUCC 125 MG IJ SOLR
125.0000 mg | Freq: Once | INTRAMUSCULAR | Status: AC
  Administered 2018-05-14: 125 mg via INTRAMUSCULAR

## 2018-05-14 MED ORDER — KETOROLAC TROMETHAMINE 30 MG/ML IJ SOLN
30.0000 mg | Freq: Once | INTRAMUSCULAR | Status: AC
  Administered 2018-05-14: 14:00:00 30 mg via INTRAMUSCULAR

## 2018-05-14 MED ORDER — GABAPENTIN 800 MG PO TABS: ORAL_TABLET | ORAL | 3 refills | Status: DC

## 2018-05-14 NOTE — Assessment & Plan Note (Signed)
Multilevel protruding discs, worst at L4-L5 and L5-S1. Having some right-sided L5 distribution radicular pain to the middle toes. No bowel or bladder dysfunction, saddle numbness, constitutional symptoms, progressive weakness. She is in a great deal of discomfort, Toradol 30, Solu-Medrol 125 intramuscular today. Increasing gabapentin from 900 mg at bedtime to 800 mg 2-3 times per day. Referral to Dr. Francesco Runner for a right L5-S1 interlaminar epidural. Return to see me 1 month after the injection.

## 2018-05-14 NOTE — Addendum Note (Signed)
Addended by: Beatris Ship L on: 05/14/2018 02:10 PM   Modules accepted: Orders

## 2018-05-14 NOTE — Progress Notes (Signed)
Subjective:    CC: Low back pain  HPI: This is a very pleasant 48 year old female with a history of lumbar DDD, we have done epidurals in the past, they have traditionally been done at L4-L5, she does have L4-S1 degenerative disc disease worst at L5-S1 with by foraminal stenosis that is mild.  Unfortunately she is having recurrence of pain, radiating from the buttock, to the right thigh, and down to the middle 3 toes of the foot.  No progressive weakness, no bowel or bladder dysfunction, saddle numbness, constitutional symptoms, no trauma.  Currently using gabapentin 900 mg at bedtime.  I reviewed the past medical history, family history, social history, surgical history, and allergies today and no changes were needed.  Please see the problem list section below in epic for further details.  Past Medical History: Past Medical History:  Diagnosis Date  . Arthritis   . Hypertension   . PCOS (polycystic ovarian syndrome)   . Thyroid disease   . TIA (transient ischemic attack)    Past Surgical History: Past Surgical History:  Procedure Laterality Date  . 2 ectopic pregnancies  0109,3235  . lymph nodes    . melanoma removal     RT SIDE  . removal of fallopian tube  1999   Social History: Social History   Socioeconomic History  . Marital status: Married    Spouse name: Herbie Baltimore  . Number of children: 3  . Years of education: Not on file  . Highest education level: Not on file  Occupational History  . Occupation: Education officer, museum [place]    Employer: [place]: [employer].   Social Needs  . Financial resource strain: Not on file  . Food insecurity:    Worry: Not on file    Inability: Not on file  . Transportation needs:    Medical: Not on file    Non-medical: Not on file  Tobacco Use  . Smoking status: Never Smoker  . Smokeless tobacco: Never Used  Substance and Sexual Activity  . Alcohol use: No  . Drug use: No  . Sexual activity: Yes    Partners: Male     Comment: adopted  Lifestyle  . Physical activity:    Days per week: Not on file    Minutes per session: Not on file  . Stress: Not on file  Relationships  . Social connections:    Talks on phone: Not on file    Gets together: Not on file    Attends religious service: Not on file    Active member of club or organization: Not on file    Attends meetings of clubs or organizations: Not on file    Relationship status: Not on file  Other Topics Concern  . Not on file  Social History Narrative   Caffeine daily. No regular exercise.  She was adopted.    Family History: Family History  Problem Relation Age of Onset  . Melanoma Mother   . Depression Mother   . Hyperlipidemia Mother   . Colon cancer Maternal Grandmother   . Colon cancer Father 49       deceased.    Allergies: Allergies  Allergen Reactions  . Cymbalta [Duloxetine Hcl] Other (See Comments)    Jolting/jerking sensations.   . Sulfonamide Derivatives Hives  . Tape Rash    Use paper tape   Medications: See med rec.  Review of Systems: No fevers, chills, night sweats, weight loss, chest pain, or  shortness of breath.   Objective:    General: Well Developed, well nourished, and in no acute distress.  Neuro: Alert and oriented x3, extra-ocular muscles intact, sensation grossly intact.  HEENT: Normocephalic, atraumatic, pupils equal round reactive to light, neck supple, no masses, no lymphadenopathy, thyroid nonpalpable.  Skin: Warm and dry, no rashes. Cardiac: Regular rate and rhythm, no murmurs rubs or gallops, no lower extremity edema.  Respiratory: Clear to auscultation bilaterally. Not using accessory muscles, speaking in full sentences. Back Exam:  Inspection: Unremarkable  Motion: Flexion 45 deg, Extension 45 deg, Side Bending to 45 deg bilaterally,  Rotation to 45 deg bilaterally  SLR laying: Negative  XSLR laying: Negative  Palpable tenderness: None. FABER: negative. Sensory change: Gross sensation  intact to all lumbar and sacral dermatomes.  Reflexes: 2+ at both patellar tendons, 2+ at achilles tendons, Babinski's downgoing.  Strength at foot  Plantar-flexion: 5/5 Dorsi-flexion: 5/5 Eversion: 5/5 Inversion: 5/5  Leg strength  Quad: 5/5 Hamstring: 5/5 Hip flexor: 5/5 Hip abductors: 5/5  Gait unremarkable.  Impression and Recommendations:    Lumbar spondylosis Multilevel protruding discs, worst at L4-L5 and L5-S1. Having some right-sided L5 distribution radicular pain to the middle toes. No bowel or bladder dysfunction, saddle numbness, constitutional symptoms, progressive weakness. She is in a great deal of discomfort, Toradol 30, Solu-Medrol 125 intramuscular today. Increasing gabapentin from 900 mg at bedtime to 800 mg 2-3 times per day. Referral to Dr. Francesco Runner for a right L5-S1 interlaminar epidural. Return to see me 1 month after the injection.   ___________________________________________ Gwen Her. Dianah Field, M.D., ABFM., CAQSM. Primary Care and Sports Medicine [facility] [facility]  Adjunct Professor of [place] [employer] [facility] of Medicine

## 2018-05-20 DIAGNOSIS — M5136 Other intervertebral disc degeneration, lumbar region: Secondary | ICD-10-CM | POA: Diagnosis not present

## 2018-05-20 DIAGNOSIS — M47816 Spondylosis without myelopathy or radiculopathy, lumbar region: Secondary | ICD-10-CM | POA: Diagnosis not present

## 2018-05-22 ENCOUNTER — Encounter: Payer: Self-pay | Admitting: Sports Medicine

## 2018-06-02 ENCOUNTER — Encounter: Payer: Self-pay | Admitting: Sports Medicine

## 2018-06-13 ENCOUNTER — Encounter: Payer: Self-pay | Admitting: Sports Medicine

## 2018-06-13 ENCOUNTER — Ambulatory Visit (INDEPENDENT_AMBULATORY_CARE_PROVIDER_SITE_OTHER): Payer: BLUE CROSS/BLUE SHIELD | Admitting: Sports Medicine

## 2018-06-13 DIAGNOSIS — M5412 Radiculopathy, cervical region: Secondary | ICD-10-CM

## 2018-06-13 DIAGNOSIS — M47816 Spondylosis without myelopathy or radiculopathy, lumbar region: Secondary | ICD-10-CM

## 2018-06-13 NOTE — Assessment & Plan Note (Signed)
Right L5-S1 interlaminar epidural with Dr. Francesco Runner provided near complete relief. We will can continue her interventional treatment with him.

## 2018-06-13 NOTE — Progress Notes (Signed)
Subjective:    CC: Neck pain  HPI: Carly [name] is a very pleasant 48 year old female, we have been treating her for cervical DDD and lumbar DDD, she finally had her epidural with Dr. Francesco Runner, it was the best she is ever had and she is for the most part completely pain-free.  She had a cervical epidural in December, she is doing relatively well but has a single area of muscle spasm superior medial to her scapula.  Moderate, persistent, localized without radiation, nothing radicular, no constitutional symptoms, no trauma, no progressive weakness.  I reviewed the past medical history, family history, social history, surgical history, and allergies today and no changes were needed.  Please see the problem list section below in epic for further details.  Past Medical History: Past Medical History:  Diagnosis Date  . Arthritis   . Hypertension   . PCOS (polycystic ovarian syndrome)   . Thyroid disease   . TIA (transient ischemic attack)    Past Surgical History: Past Surgical History:  Procedure Laterality Date  . 2 ectopic pregnancies  4098,1191  . lymph nodes    . melanoma removal     RT SIDE  . removal of fallopian tube  1999   Social History: Social History   Socioeconomic History  . Marital status: Married    Spouse name: Herbie Baltimore  . Number of children: 3  . Years of education: Not on file  . Highest education level: Not on file  Occupational History  . 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   Social Needs  . Financial resource strain: Not on file  . Food insecurity:    Worry: Not on file    Inability: Not on file  . Transportation needs:    Medical: Not on file    Non-medical: Not on file  Tobacco Use  . Smoking status: Never Smoker  . Smokeless tobacco: Never Used  Substance and Sexual Activity  . Alcohol use: No  . Drug use: No  . Sexual activity: Yes    Partners: Male    Comment: adopted  Lifestyle  . Physical  activity:    Days per week: Not on file    Minutes per session: Not on file  . Stress: Not on file  Relationships  . Social connections:    Talks on phone: Not on file    Gets together: Not on file    Attends religious service: Not on file    Active member of club or organization: Not on file    Attends meetings of clubs or organizations: Not on file    Relationship status: Not on file  Other Topics Concern  . Not on file  Social History Narrative   Caffeine daily. No regular exercise.  She was adopted.    Family History: Family History  Problem Relation Age of Onset  . Melanoma Mother   . Depression Mother   . Hyperlipidemia Mother   . Colon cancer Maternal Grandmother   . Colon cancer Father 69       deceased.    Allergies: Allergies  Allergen Reactions  . Cymbalta [Duloxetine Hcl] Other (See Comments)    Jolting/jerking sensations.   . Sulfonamide Derivatives Hives  . Tape Rash    Use paper tape   Medications: See med rec.  Review of Systems: No fevers, chills, night sweats, weight loss, chest pain, or shortness of breath.   Objective:  General: Well Developed, well nourished, and in no acute distress.  Neuro: Alert and oriented x3, extra-ocular muscles intact, sensation grossly intact.  HEENT: Normocephalic, atraumatic, pupils equal round reactive to light, neck supple, no masses, no lymphadenopathy, thyroid nonpalpable.  Skin: Warm and dry, no rashes. Cardiac: Regular rate and rhythm, no murmurs rubs or gallops, no lower extremity edema.  Respiratory: Clear to auscultation bilaterally. Not using accessory muscles, speaking in full sentences. Neck: Negative spurling's Full neck range of motion Grip strength and sensation normal in bilateral hands Strength good C4 to T1 distribution No sensory change to C4 to T1 Reflexes normal Discrete area of spasm over the levator scapulae on the right.  Procedure:  Injection of #3 trigger points along the right  levator scapulae Consent obtained and verified. Time-out conducted. Noted no overlying erythema, induration, or other signs of local infection. Skin prepped in a sterile fashion. Topical analgesic spray: Ethyl chloride. Completed without difficulty. Meds: 1 cc Kenalog 40, 1 cc lidocaine spread out between 3 trigger points. Pain immediately improved suggesting accurate placement of the medication. Advised to call if fevers/chills, erythema, induration, drainage, or persistent bleeding.  Impression and Recommendations:    Radiculitis of right cervical region Multilevel cervical DDD Has an area of muscle spasm along her right levator scapulae. #3 trigger point injections along the right levator scapulae Return in 1 month, continue dry needling with her PT provider.  Lumbar spondylosis Right L5-S1 interlaminar epidural with Dr. Francesco Runner provided near complete relief. We will can continue her interventional treatment with him.   ___________________________________________ Gwen Her. Dianah Field, M.D., ABFM., CAQSM. Primary Care and Sports Medicine [place] [facility]  Adjunct Professor of [place] of [facility] of Medicine

## 2018-06-13 NOTE — Assessment & Plan Note (Addendum)
Multilevel cervical DDD Has an area of muscle spasm along her right levator scapulae. #3 trigger point injections along the right levator scapulae Return in 1 month, continue dry needling with her PT provider.

## 2018-06-20 ENCOUNTER — Encounter: Payer: Self-pay | Admitting: Sports Medicine

## 2018-06-20 DIAGNOSIS — M5412 Radiculopathy, cervical region: Secondary | ICD-10-CM

## 2018-06-25 ENCOUNTER — Other Ambulatory Visit: Payer: Self-pay | Admitting: Family Medicine

## 2018-06-25 ENCOUNTER — Encounter: Payer: Self-pay | Admitting: Family Medicine

## 2018-06-25 NOTE — Telephone Encounter (Signed)
Patient is past due for an appointment. I have sent her a message through [facility].

## 2018-06-25 NOTE — Assessment & Plan Note (Signed)
Multilevel cervical DDD worst at C5-C6, we did 3 trigger point injections along the right levator scapulae without much improvement. Continue physical therapy, we are going to proceed now with a right C5-C6 cervical epidural with Dr. Francesco Runner.

## 2018-06-26 ENCOUNTER — Ambulatory Visit (INDEPENDENT_AMBULATORY_CARE_PROVIDER_SITE_OTHER): Payer: BC Managed Care – PPO | Admitting: Family Medicine

## 2018-06-26 ENCOUNTER — Encounter: Payer: Self-pay | Admitting: Family Medicine

## 2018-06-26 VITALS — Ht 64.0 in | Wt 143.0 lb

## 2018-06-26 DIAGNOSIS — I10 Essential (primary) hypertension: Secondary | ICD-10-CM | POA: Diagnosis not present

## 2018-06-26 DIAGNOSIS — J4530 Mild persistent asthma, uncomplicated: Secondary | ICD-10-CM | POA: Diagnosis not present

## 2018-06-26 DIAGNOSIS — E039 Hypothyroidism, unspecified: Secondary | ICD-10-CM | POA: Diagnosis not present

## 2018-06-26 NOTE — Progress Notes (Signed)
Virtual Visit via Video Note  I connected with Carly [name] on 06/26/18 at  3:40 PM EDT by a video enabled telemedicine application and verified that I am speaking with the correct person using two identifiers.   I discussed the limitations of evaluation and management by telemedicine and the availability of in person appointments. The patient expressed understanding and agreed to proceed.  Pt was at home and I was in my office for the virtual visit.     Subjective:    CC: BP check   HPI: Hypertension- Pt denies chest pain, SOB, dizziness, or heart palpitations.  Taking meds as directed w/o problems.  Denies medication side effects.    Hypothyroidism - Taking medication regularly in the AM away from food and vitamins, etc. No recent change to skin, hair, or energy levels.  F/U Asthma - hasn't had to use her inahler this spring.  Says she is been using her Flonase most of the spring and feels like that has made a big difference in keeping her asthma from flaring.  Follow-up insomnia-she is also requested refills on her Ambien yesterday. Takes it ever night.   Past medical history, Surgical history, Family history not pertinant except as noted below, Social history, Allergies, and medications have been entered into the medical record, reviewed, and corrections made.   Review of Systems: No fevers, chills, night sweats, weight loss, chest pain, or shortness of breath.   Objective:    General: Speaking clearly in complete sentences without any shortness of breath.  Alert and oriented x3.  Normal judgment. No apparent acute distress.  Well groomed.    Impression and Recommendations:   HTN - Well controlled. Continue current regimen. Follow up in  6 months.   Hypothyroid -due to recheck TSH.  We can send refills once we know if her dose is going to stay the same.  Asthma, mild intermittent-no recent exacerbations ans she I sactually doing well.  Is using as needed albuterol as  needed at this point.  Insomnia -continues to take her Ambien pretty regularly and sometimes it works and sometimes it does not.  90-day supply sent to pharmacy yesterday.  Follow-up if needed.     I discussed the assessment and treatment plan with the patient. The patient was provided an opportunity to ask questions and all were answered. The patient agreed with the plan and demonstrated an understanding of the instructions.   The patient was advised to call back or seek an in-person evaluation if the symptoms worsen or if the condition fails to improve as anticipated.   Beatrice Lecher, MD

## 2018-07-10 DIAGNOSIS — I10 Essential (primary) hypertension: Secondary | ICD-10-CM | POA: Diagnosis not present

## 2018-07-10 DIAGNOSIS — E039 Hypothyroidism, unspecified: Secondary | ICD-10-CM | POA: Diagnosis not present

## 2018-07-11 LAB — COMPLETE METABOLIC PANEL WITH GFR
AG Ratio: 1.7 (calc) (ref 1.0–2.5)
ALT: 11 U/L (ref 6–29)
AST: 13 U/L (ref 10–35)
Albumin: 4.3 g/dL (ref 3.6–5.1)
Alkaline phosphatase (APISO): 42 U/L (ref 31–125)
BUN: 11 mg/dL (ref 7–25)
CO2: 28 mmol/L (ref 20–32)
Calcium: 9.5 mg/dL (ref 8.6–10.2)
Chloride: 100 mmol/L (ref 98–110)
Creat: 1.01 mg/dL (ref 0.50–1.10)
GFR, Est African American: 76 mL/min/{1.73_m2} (ref >=60)
GFR, Est Non African American: 65 mL/min/{1.73_m2} (ref >=60)
Globulin: 2.6 g/dL (calc) (ref 1.9–3.7)
Glucose, Bld: 84 mg/dL (ref 65–99)
Potassium: 4 mmol/L (ref 3.5–5.3)
Sodium: 138 mmol/L (ref 135–146)
Total Bilirubin: 0.4 mg/dL (ref 0.2–1.2)
Total Protein: 6.9 g/dL (ref 6.1–8.1)

## 2018-07-11 LAB — TSH: TSH: 0.09 mIU/L — ABNORMAL LOW

## 2018-07-11 LAB — LIPID PANEL
Cholesterol: 180 mg/dL (ref <200)
HDL: 46 mg/dL — ABNORMAL LOW (ref >=50)
LDL Cholesterol (Calc): 116 mg/dL (calc) — ABNORMAL HIGH
Non-HDL Cholesterol (Calc): 134 mg/dL (calc) — ABNORMAL HIGH (ref <130)
Total CHOL/HDL Ratio: 3.9 (calc) (ref <5.0)
Triglycerides: 84 mg/dL (ref <150)

## 2018-07-15 ENCOUNTER — Encounter: Payer: Self-pay | Admitting: Family Medicine

## 2018-07-18 DIAGNOSIS — M4722 Other spondylosis with radiculopathy, cervical region: Secondary | ICD-10-CM | POA: Diagnosis not present

## 2018-07-18 DIAGNOSIS — M9981 Other biomechanical lesions of cervical region: Secondary | ICD-10-CM | POA: Diagnosis not present

## 2018-07-18 DIAGNOSIS — M5030 Other cervical disc degeneration, unspecified cervical region: Secondary | ICD-10-CM | POA: Diagnosis not present

## 2018-07-21 ENCOUNTER — Encounter: Payer: Self-pay | Admitting: Sports Medicine

## 2018-07-21 MED ORDER — PREDNISONE 10 MG (48) PO TBPK: ORAL_TABLET | Freq: Every day | ORAL | 0 refills | Status: DC

## 2018-08-03 ENCOUNTER — Encounter: Payer: Self-pay | Admitting: Family Medicine

## 2018-08-03 ENCOUNTER — Other Ambulatory Visit: Payer: Self-pay | Admitting: Family Medicine

## 2018-08-15 ENCOUNTER — Encounter: Payer: Self-pay | Admitting: Sports Medicine

## 2018-08-27 ENCOUNTER — Encounter: Payer: Self-pay | Admitting: Sports Medicine

## 2018-08-27 ENCOUNTER — Ambulatory Visit (INDEPENDENT_AMBULATORY_CARE_PROVIDER_SITE_OTHER): Payer: BC Managed Care – PPO | Admitting: Sports Medicine

## 2018-08-27 ENCOUNTER — Other Ambulatory Visit: Payer: Self-pay

## 2018-08-27 DIAGNOSIS — M130 Polyarthritis, unspecified: Secondary | ICD-10-CM

## 2018-08-27 NOTE — Progress Notes (Signed)
Subjective:    CC: Hand pain  HPI: Worsening pain in the left third PIP, moderate, persistent, localized without radiation.  No trauma.  Last injection was in March.  I reviewed the past medical history, family history, social history, surgical history, and allergies today and no changes were needed.  Please see the problem list section below in epic for further details.  Past Medical History: Past Medical History:  Diagnosis Date  . Arthritis   . Hypertension   . PCOS (polycystic ovarian syndrome)   . Thyroid disease   . TIA (transient ischemic attack)    Past Surgical History: Past Surgical History:  Procedure Laterality Date  . 2 ectopic pregnancies  [id number]  . lymph nodes    . melanoma removal     RT SIDE  . removal of fallopian tube  1999   Social History: Social History   Socioeconomic History  . Marital status: Married    Spouse name: Herbie Baltimore  . Number of children: 3  . Years of education: Not on file  . Highest education level: Not on file  Occupational History  . 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   Social Needs  . Financial resource strain: Not on file  . Food insecurity    Worry: Not on file    Inability: Not on file  . Transportation needs    Medical: Not on file    Non-medical: Not on file  Tobacco Use  . Smoking status: Never Smoker  . Smokeless tobacco: Never Used  Substance and Sexual Activity  . Alcohol use: No  . Drug use: No  . Sexual activity: Yes    Partners: Male    Comment: adopted  Lifestyle  . Physical activity    Days per week: Not on file    Minutes per session: Not on file  . Stress: Not on file  Relationships  . Social Herbalist on phone: Not on file    Gets together: Not on file    Attends religious service: Not on file    Active member of club or organization: Not on file    Attends meetings of clubs or organizations: Not on file    Relationship  status: Not on file  Other Topics Concern  . Not on file  Social History Narrative   Caffeine daily. No regular exercise.  She was adopted.    Family History: Family History  Problem Relation Age of Onset  . Melanoma Mother   . Depression Mother   . Hyperlipidemia Mother   . Colon cancer Maternal Grandmother   . Colon cancer Father 24       deceased.    Allergies: Allergies  Allergen Reactions  . Cymbalta [Duloxetine Hcl] Other (See Comments)    Jolting/jerking sensations.   . Sulfonamide Derivatives Hives  . Tape Rash    Use paper tape   Medications: See med rec.  Review of Systems: No fevers, chills, night sweats, weight loss, chest pain, or shortness of breath.   Objective:    General: Well Developed, well nourished, and in no acute distress.  Neuro: Alert and oriented x3, extra-ocular muscles intact, sensation grossly intact.  HEENT: Normocephalic, atraumatic, pupils equal round reactive to light, neck supple, no masses, no lymphadenopathy, thyroid nonpalpable.  Skin: Warm and dry, no rashes. Cardiac: Regular rate and rhythm, no murmurs rubs or gallops, no lower extremity edema.  Respiratory: Clear  to auscultation bilaterally. Not using accessory muscles, speaking in full sentences. Left hand: Swollen, tender third PIP.  Procedure: Real-time Ultrasound Guided injection of the left third PIP Device: GE Logiq E  Verbal informed consent obtained.  Time-out conducted.  Noted no overlying erythema, induration, or other signs of local infection.  Skin prepped in a sterile fashion.  Local anesthesia: Topical Ethyl chloride.  With sterile technique and under real time ultrasound guidance:  1/2 cc Kenalog 40, 1/2 cc lidocaine injected easily Completed without difficulty  Pain immediately resolved suggesting accurate placement of the medication.  Advised to call if fevers/chills, erythema, induration, drainage, or persistent bleeding.  Images permanently stored and  available for review in the ultrasound unit.  Impression: Technically successful ultrasound guided injection.  Impression and Recommendations:    Polyarthritis with left third PIP osteoarthritis Left third PIP injection today, previous injection was in March 2020.   ___________________________________________ Gwen Her. Dianah Field, M.D., ABFM., CAQSM. Primary Care and Sports Medicine [place] [facility]  Adjunct Professor of [place] [employer] [facility] [employer] Medicine

## 2018-08-27 NOTE — Assessment & Plan Note (Signed)
Left third PIP injection today, previous injection was in March 2020.

## 2018-09-10 ENCOUNTER — Encounter: Payer: Self-pay | Admitting: Sports Medicine

## 2018-09-10 DIAGNOSIS — M47816 Spondylosis without myelopathy or radiculopathy, lumbar region: Secondary | ICD-10-CM

## 2018-09-10 NOTE — Telephone Encounter (Signed)
Pt is actually going to Dr. Francesco Runner instead of [facility].

## 2018-09-10 NOTE — Telephone Encounter (Signed)
Now she wants [facility], see mychart messages, ya'll deal with this.Marland KitchenMarland KitchenMarland Kitchen

## 2018-09-10 NOTE — Telephone Encounter (Signed)
Please contact [facility] for scheduling

## 2018-09-13 ENCOUNTER — Other Ambulatory Visit: Payer: Self-pay | Admitting: Family Medicine

## 2018-09-16 ENCOUNTER — Other Ambulatory Visit: Payer: Self-pay

## 2018-09-16 ENCOUNTER — Ambulatory Visit
Admission: RE | Admit: 2018-09-16 | Discharge: 2018-09-16 | Disposition: A | Discharge status: Home / Self Care | Payer: BC Managed Care – PPO | Source: Ambulatory Visit | Attending: Sports Medicine | Admitting: Sports Medicine

## 2018-09-16 DIAGNOSIS — M545 Low back pain: Secondary | ICD-10-CM | POA: Diagnosis not present

## 2018-09-16 DIAGNOSIS — M79604 Pain in right leg: Secondary | ICD-10-CM | POA: Diagnosis not present

## 2018-09-16 MED ORDER — IOPAMIDOL (ISOVUE-M 200) INJECTION 41%
1.0000 mL | Freq: Once | INTRAMUSCULAR | Status: AC
  Administered 2018-09-16: 10:00:00 1 mL via EPIDURAL

## 2018-09-16 MED ORDER — METHYLPREDNISOLONE ACETATE 40 MG/ML INJ SUSP (RADIOLOG
120.0000 mg | Freq: Once | INTRAMUSCULAR | Status: AC
  Administered 2018-09-16: 10:00:00 120 mg via EPIDURAL

## 2018-09-16 NOTE — Discharge Instructions (Signed)

## 2018-09-22 ENCOUNTER — Other Ambulatory Visit: Payer: Self-pay | Admitting: Sports Medicine

## 2018-09-22 DIAGNOSIS — M47816 Spondylosis without myelopathy or radiculopathy, lumbar region: Secondary | ICD-10-CM

## 2018-09-24 ENCOUNTER — Other Ambulatory Visit: Payer: Self-pay | Admitting: Family Medicine

## 2018-10-06 ENCOUNTER — Other Ambulatory Visit: Payer: Self-pay | Admitting: Family Medicine

## 2018-10-06 DIAGNOSIS — D510 Vitamin B12 deficiency anemia due to intrinsic factor deficiency: Secondary | ICD-10-CM

## 2018-10-06 MED ORDER — CYANOCOBALAMIN 1000 MCG/ML IJ SOLN: 1000.0000 ug | INTRAMUSCULAR | 0 refills | Status: DC

## 2018-10-06 NOTE — Telephone Encounter (Signed)
Received a refill request for B-12 and it was filled per PCP. PCP recommends patient coming back for labs before her next B-12 injection is due. The patient is aware and will get labs completed after this injection.

## 2018-10-21 ENCOUNTER — Encounter: Payer: Self-pay | Admitting: Family Medicine

## 2018-10-21 DIAGNOSIS — R05 Cough: Secondary | ICD-10-CM | POA: Diagnosis not present

## 2018-10-21 DIAGNOSIS — R52 Pain, unspecified: Secondary | ICD-10-CM | POA: Diagnosis not present

## 2018-10-21 DIAGNOSIS — U071 COVID-19: Secondary | ICD-10-CM | POA: Diagnosis not present

## 2018-10-21 DIAGNOSIS — R509 Fever, unspecified: Secondary | ICD-10-CM | POA: Diagnosis not present

## 2018-10-23 ENCOUNTER — Other Ambulatory Visit: Payer: Self-pay | Admitting: Sports Medicine

## 2018-10-23 DIAGNOSIS — M47816 Spondylosis without myelopathy or radiculopathy, lumbar region: Secondary | ICD-10-CM

## 2018-10-27 ENCOUNTER — Encounter: Payer: Self-pay | Admitting: Sports Medicine

## 2018-10-27 ENCOUNTER — Ambulatory Visit (INDEPENDENT_AMBULATORY_CARE_PROVIDER_SITE_OTHER): Payer: BC Managed Care – PPO | Admitting: Sports Medicine

## 2018-10-27 DIAGNOSIS — U071 COVID-19: Secondary | ICD-10-CM | POA: Diagnosis not present

## 2018-10-27 MED ORDER — DEXAMETHASONE 4 MG PO TABS: 4.0000 mg | ORAL_TABLET | Freq: Three times a day (TID) | ORAL | 0 refills | Status: DC

## 2018-10-27 NOTE — Assessment & Plan Note (Signed)
Stable. She likely has eustachian tube dysfunction. Adding Decadron, we can revisit this in 1 week virtually.

## 2018-10-27 NOTE — Progress Notes (Signed)
Virtual Visit via WebEx/MyChart   I connected with  Carly [name]  on 10/27/18 via WebEx/MyChart/Doximity Video and verified that I am speaking with the correct person using two identifiers.   I discussed the limitations, risks, security and privacy concerns of performing an evaluation and management service by WebEx/MyChart/Doximity Video, including the higher likelihood of inaccurate diagnosis and treatment, and the availability of in person appointments.  We also discussed the likely need of an additional face to face encounter for complete and high quality delivery of care.  I also discussed with the patient that there may be a patient responsible charge related to this service. The patient expressed understanding and wishes to proceed.  Provider location is either at home or medical facility. Patient location is at their home, different from provider location. People involved in care of the patient during this telehealth encounter were myself, my nurse/medical assistant, and my front office/scheduling team member.  Subjective:    CC: Feeling sick  HPI: Carly [name] is a pleasant 48 year old female, she was diagnosed with COVID last week, she is overall doing okay with the exception of having developed left-sided sore throat with radiation to her ear.  No cough, no shortness of breath, no chest pain currently, no fevers, chills.  She does have anosmia and ageusia.  I reviewed the past medical history, family history, social history, surgical history, and allergies today and no changes were needed.  Please see the problem list section below in epic for further details.  Past Medical History: Past Medical History:  Diagnosis Date  . Arthritis   . Hypertension   . PCOS (polycystic ovarian syndrome)   . Thyroid disease   . TIA (transient ischemic attack)    Past Surgical History: Past Surgical History:  Procedure Laterality Date  . 2 ectopic pregnancies  [id number]  . lymph nodes    .  melanoma removal     RT SIDE  . removal of fallopian tube  1999   Social History: Social History   Socioeconomic History  . Marital status: Married    Spouse name: Herbie Baltimore  . Number of children: 3  . Years of education: Not on file  . Highest education level: Not on file  Occupational History  . Occupation: Education officer, museum [place]    Employer: [place]: [employer].   Social Needs  . Financial resource strain: Not on file  . Food insecurity    Worry: Not on file    Inability: Not on file  . Transportation needs    Medical: Not on file    Non-medical: Not on file  Tobacco Use  . Smoking status: Never Smoker  . Smokeless tobacco: Never Used  Substance and Sexual Activity  . Alcohol use: No  . Drug use: No  . Sexual activity: Yes    Partners: Male    Comment: adopted  Lifestyle  . Physical activity    Days per week: Not on file    Minutes per session: Not on file  . Stress: Not on file  Relationships  . Social Herbalist on phone: Not on file    Gets together: Not on file    Attends religious service: Not on file    Active member of club or organization: Not on file    Attends meetings of clubs or organizations: Not on file    Relationship status: Not on file  Other Topics Concern  . Not on file  Social History Narrative   Caffeine daily. No regular exercise.  She was adopted.    Family History: Family History  Problem Relation Age of Onset  . Melanoma Mother   . Depression Mother   . Hyperlipidemia Mother   . Colon cancer Maternal Grandmother   . Colon cancer Father 44       deceased.    Allergies: Allergies  Allergen Reactions  . Cymbalta [Duloxetine Hcl] Other (See Comments)    Jolting/jerking sensations.   . Sulfonamide Derivatives Hives  . Tape Rash    Use paper tape   Medications: See med rec.  Review of Systems: No fevers, chills, night sweats, weight loss, chest pain, or shortness of breath.   Objective:     General: Speaking full sentences, no audible heavy breathing.  Sounds alert and appropriately interactive.  Appears well.  Face symmetric.  Extraocular movements intact.  Pupils equal and round.  No nasal flaring or accessory muscle use visualized.  No other physical exam performed due to the non-physical nature of this visit.  Impression and Recommendations:    COVID-19 Stable. She likely has eustachian tube dysfunction. Adding Decadron, we can revisit this in 1 week virtually.  I discussed the above assessment and treatment plan with the patient. The patient was provided an opportunity to ask questions and all were answered. The patient agreed with the plan and demonstrated an understanding of the instructions.   The patient was advised to call back or seek an in-person evaluation if the symptoms worsen or if the condition fails to improve as anticipated.   I provided 25 minutes of non-face-to-face time during this encounter, 15 minutes of additional time was needed to gather information, review chart, records, communicate/coordinate with staff remotely, troubleshooting the multiple errors that we get every time when trying to do video calls through the electronic medical record, WebEx, and Doximity, restart the encounter multiple times due to instability of the software, as well as complete documentation.   ___________________________________________ Gwen Her. Dianah Field, M.D., ABFM., CAQSM. Primary Care and Sports Medicine [facility]  Adjunct Professor of [place] of [facility] of Medicine

## 2018-11-10 ENCOUNTER — Ambulatory Visit (INDEPENDENT_AMBULATORY_CARE_PROVIDER_SITE_OTHER): Payer: BC Managed Care – PPO | Admitting: Family Medicine

## 2018-11-10 ENCOUNTER — Encounter: Payer: Self-pay | Admitting: Family Medicine

## 2018-11-10 DIAGNOSIS — J0100 Acute maxillary sinusitis, unspecified: Secondary | ICD-10-CM | POA: Diagnosis not present

## 2018-11-10 MED ORDER — AMOXICILLIN-POT CLAVULANATE 875-125 MG PO TABS: 1.0000 | ORAL_TABLET | Freq: Two times a day (BID) | ORAL | 0 refills | Status: DC

## 2018-11-10 NOTE — Progress Notes (Signed)
Acute Office Visit  Subjective:    Patient ID: Carly [name], female    DOB: August 10, 1970, 48 y.o.   MRN: [id number]  Chief Complaint  Patient presents with  . Cough    HPI Patient is in today for She reports that she has had a cough x 3wks and stated that she is now coughing up a light yellow mucous.  She was diagnosed with Covid on September 29.  She feels like a lot of her chest symptoms are better but now she starting to get some more postnasal drip, nasal congestion and tenderness over her maxillary sinuses.  She does tend to have a fall allergies and has been prone to sinus infections in the fall.  She has recently restarted her Flonase and has been taking some ZzzQuil and DayQuil and yesterday actually started some Sudafed.  She says the drainage is mostly light yellow in color.  She still has not had a fever.  She still gets a little short of breath with exertion and but says that is not really new since she was diagnosed with Covid. She stated that her cervical  lymph nodes are swollen and tender to touch. constantly clearing her throat.  Denies f/s/c/n/v/d. She stated that she just feels very fatigued..  Past Medical History:  Diagnosis Date  . Arthritis   . Hypertension   . PCOS (polycystic ovarian syndrome)   . Thyroid disease   . TIA (transient ischemic attack)     Past Surgical History:  Procedure Laterality Date  . 2 ectopic pregnancies  [id number]  . lymph nodes    . melanoma removal     RT SIDE  . removal of fallopian tube  1999    Family History  Problem Relation Age of Onset  . Melanoma Mother   . Depression Mother   . Hyperlipidemia Mother   . Colon cancer Maternal Grandmother   . Colon cancer Father 61       deceased.     Social History   Socioeconomic History  . Marital status: Married    Spouse name: Herbie [place]  . Number of children: 3  . Years of education: Not on file  . Highest education level: Not on file  Occupational History  .  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   Social Needs  . Financial resource strain: Not on file  . Food insecurity    Worry: Not on file    Inability: Not on file  . Transportation needs    Medical: Not on file    Non-medical: Not on file  Tobacco Use  . Smoking status: Never Smoker  . Smokeless tobacco: Never Used  Substance and Sexual Activity  . Alcohol use: No  . Drug use: No  . Sexual activity: Yes    Partners: Male    Comment: adopted  Lifestyle  . Physical activity    Days per week: Not on file    Minutes per session: Not on file  . Stress: Not on file  Relationships  . Social Herbalist on phone: Not on file    Gets together: Not on file    Attends religious service: Not on file    Active member of club or organization: Not on file    Attends meetings of clubs or organizations: Not on file    Relationship status: Not on file  . Intimate partner violence  Fear of current or ex partner: Not on file    Emotionally abused: Not on file    Physically abused: Not on file    Forced sexual activity: Not on file  Other Topics Concern  . Not on file  Social History Narrative   Caffeine daily. No regular exercise.  She was adopted.     Outpatient Medications Prior to Visit  Medication Sig Dispense Refill  . albuterol (PROVENTIL HFA;VENTOLIN HFA) 108 (90 Base) MCG/ACT inhaler Inhale 2 puffs into the lungs every 4 (four) hours as needed for wheezing or shortness of breath (bronchospasm). 1 Inhaler 0  . Calcium Carbonate-Vitamin D 600-400 MG-UNIT tablet Take 1 tablet by mouth 2 (two) times daily. 60 tablet 11  . cyanocobalamin (,VITAMIN B-12,) 1000 MCG/ML injection Inject 1 mL (1,000 mcg total) into the muscle every 21 ( twenty-one) days. 10 mL 0  . fluticasone (FLONASE) 50 MCG/ACT nasal spray Place 2 sprays into the nose daily as needed. For seasonal allergy nasal congestion 16 g 1  . gabapentin (NEURONTIN) 800  MG tablet TAKE 1 TABLET BY MOUTH 2 TO 3 TIMES DAILY 90 tablet 0  . hydrochlorothiazide (HYDRODIURIL) 12.5 MG tablet TAKE 1 2 (ONE HALF) TABLET BY MOUTH ONCE DAILY    . levonorgestrel (MIRENA) 20 MCG/24HR IUD 1 Intra Uterine Device (1 each total) by Intrauterine route once. Inserted 10/19/14 1 each 0  . levothyroxine (SYNTHROID, LEVOTHROID) 125 MCG tablet Take 1 tablet (125 mcg total) by mouth daily. To alternate with 146mcg. 90 tablet 0  . losartan (COZAAR) 25 MG tablet Take 25 mg by mouth daily.    . metFORMIN (GLUCOPHAGE) 1000 MG tablet TAKE 1 TABLET BY MOUTH TWICE DAILY WITH MEALS 180 tablet 0  . omeprazole (PRILOSEC) 40 MG capsule TAKE 1 CAPSULE BY MOUTH TWICE DAILY 90 capsule 3  . spironolactone (ALDACTONE) 25 MG tablet Take 1 tablet by mouth once daily 90 tablet 0  . SYNTHROID 137 MCG tablet TAKE 1 TABLET BY MOUTH ONCE DAILY BEFORE BREAKFAST 90 tablet 0  . SYRINGE-NEEDLE, DISP, 3 ML 23G X 1" 3 ML MISC by Does not apply route.    Marland Kitchen zolpidem (AMBIEN) 10 MG tablet TAKE 1 TABLET BY MOUTH AT BEDTIME 90 tablet 0  . dexamethasone (DECADRON) 4 MG tablet Take 1 tablet (4 mg total) by mouth 3 (three) times daily. 15 tablet 0   No facility-administered medications prior to visit.     Allergies  Allergen Reactions  . Cymbalta [Duloxetine Hcl] Other (See Comments)    Jolting/jerking sensations.   . Sulfonamide Derivatives Hives  . Tape Rash    Use paper tape    ROS     Objective:    Physical Exam  There were no vitals taken for this visit. Wt Readings from Last 3 Encounters:  10/27/18 138 lb (62.6 kg)  08/27/18 142 lb (64.4 kg)  06/26/18 143 lb (64.9 kg)    Health Maintenance Due  Topic Date Due  . INFLUENZA VACCINE  08/23/2018    There are no preventive care reminders to display for this patient.   Lab Results  Component Value Date   TSH 0.09 (L) 07/10/2018   Lab Results  Component Value Date   WBC 6.1 12/27/2017   HGB 11.8 12/27/2017   HCT 35.1 12/27/2017   MCV 91.4  12/27/2017   PLT 349 12/27/2017   Lab Results  Component Value Date   NA 138 07/10/2018   K 4.0 07/10/2018   CO2 28 07/10/2018  GLUCOSE 84 07/10/2018   BUN 11 07/10/2018   CREATININE 1.01 07/10/2018   BILITOT 0.4 07/10/2018   ALKPHOS 36 09/11/2016   AST 13 07/10/2018   ALT 11 07/10/2018   PROT 6.9 07/10/2018   ALBUMIN 4.2 09/11/2016   CALCIUM 9.5 07/10/2018   Lab Results  Component Value Date   CHOL 180 07/10/2018   Lab Results  Component Value Date   HDL 46 (L) 07/10/2018   Lab Results  Component Value Date   LDLCALC 116 (H) 07/10/2018   Lab Results  Component Value Date   TRIG 84 07/10/2018   Lab Results  Component Value Date   CHOLHDL 3.9 07/10/2018   No results found for: HGBA1C     Assessment & Plan:   Problem List Items Addressed This Visit    None    Visit Diagnoses    Acute non-recurrent maxillary sinusitis    -  Primary   Relevant Medications   amoxicillin-clavulanate (AUGMENTIN) 875-125 MG tablet     Recently diagnosis of Covid.  With new onset sinus symptoms.  Though she does typically have fall allergies she has been using her Flonase.  We will go ahead and treat for sinusitis.  Prescription sent to the pharmacy for Augmentin.  If not feeling some better by Thursday then please give [place] call back.  We will need return to work note that she is feeling much better.   Meds ordered this encounter  Medications  . amoxicillin-clavulanate (AUGMENTIN) 875-125 MG tablet    Sig: Take 1 tablet by mouth 2 (two) times daily.    Dispense:  14 tablet    Refill:  0     Beatrice Lecher, MD

## 2018-11-10 NOTE — Progress Notes (Signed)
She reports that she has had a cough x 3wks and stated that she is now coughing up a light yellow mucous.   She stated that her lymph nodes are swollen and tender to touch.   Denies f/s/c/n/v/d.  She has been taking Dayquil and began taking Sudafed for congestion yesterday.   She stated that she has constant drainage in the back of her throat. She stated that she just feels very fatigued.Maryruth Eve, [place]ahoma Crocker, CMA

## 2018-11-13 ENCOUNTER — Telehealth: Payer: Self-pay

## 2018-11-13 NOTE — Telephone Encounter (Signed)
OK for return to work note

## 2018-11-13 NOTE — Telephone Encounter (Signed)
Carly [name] left a message asking for a return to work note for Monday. She states she is feeling better. Not all the symptoms have resolved only improved. She would like the note emailed to [email or web address].

## 2018-11-14 ENCOUNTER — Encounter: Payer: Self-pay | Admitting: Family Medicine

## 2018-11-14 NOTE — Telephone Encounter (Signed)
Printed and left a message for patient. Gave to front staff to email.

## 2018-11-26 ENCOUNTER — Encounter: Payer: Self-pay | Admitting: Family Medicine

## 2018-11-26 ENCOUNTER — Encounter: Payer: Self-pay | Admitting: Sports Medicine

## 2018-11-26 DIAGNOSIS — M5412 Radiculopathy, cervical region: Secondary | ICD-10-CM

## 2018-11-26 MED ORDER — PREDNISONE 20 MG PO TABS: 40.0000 mg | ORAL_TABLET | Freq: Every day | ORAL | 0 refills | Status: DC

## 2018-11-26 NOTE — Telephone Encounter (Signed)
Thank you, order signed, I changed a little bit to include the level I would like injected, no appointment needed with me.  I have also asked her to go ahead and call [employer] [facility] to schedule the appointment herself.

## 2018-11-26 NOTE — Telephone Encounter (Signed)
Looks like DX was 10/21/18  I have pended Epidural (please verify order)   Does she need appt w/ you?

## 2018-11-30 ENCOUNTER — Other Ambulatory Visit: Payer: Self-pay | Admitting: Sports Medicine

## 2018-11-30 ENCOUNTER — Other Ambulatory Visit: Payer: Self-pay | Admitting: Family Medicine

## 2018-11-30 DIAGNOSIS — M47816 Spondylosis without myelopathy or radiculopathy, lumbar region: Secondary | ICD-10-CM

## 2018-12-02 ENCOUNTER — Encounter: Payer: Self-pay | Admitting: Sports Medicine

## 2018-12-02 ENCOUNTER — Ambulatory Visit (INDEPENDENT_AMBULATORY_CARE_PROVIDER_SITE_OTHER): Payer: BC Managed Care – PPO | Admitting: Sports Medicine

## 2018-12-02 ENCOUNTER — Other Ambulatory Visit: Payer: Self-pay

## 2018-12-02 DIAGNOSIS — G8929 Other chronic pain: Secondary | ICD-10-CM

## 2018-12-02 DIAGNOSIS — M533 Sacrococcygeal disorders, not elsewhere classified: Secondary | ICD-10-CM | POA: Diagnosis not present

## 2018-12-02 NOTE — Assessment & Plan Note (Signed)
Excellent response to SI joint injection back in 2015, now with recurrence of right-sided SI joint pain radiating into the buttock, thigh but not past the knee. Repeat right SI joint injection today, return as needed.

## 2018-12-02 NOTE — Progress Notes (Signed)
Subjective:    CC: Buttock pain  HPI: This is a very pleasant 48 year old female, she just covered from COVID-19 and feels well, lately she is had pain in her right buttock, with radiation into the posterior thigh, to the knee but not past.  We did an SI joint injection back at the end of 2015, and she has done well until now.  Pain is moderate, persistent, local is with the above radiation.  I reviewed the past medical history, family history, social history, surgical history, and allergies today and no changes were needed.  Please see the problem list section below in epic for further details.  Past Medical History: Past Medical History:  Diagnosis Date  . Arthritis   . Hypertension   . PCOS (polycystic ovarian syndrome)   . Thyroid disease   . TIA (transient ischemic attack)    Past Surgical History: Past Surgical History:  Procedure Laterality Date  . 2 ectopic pregnancies  [id number]  . lymph nodes    . melanoma removal     RT SIDE  . removal of fallopian tube  1999   Social History: Social History   Socioeconomic History  . Marital status: Married    Spouse name: Herbie [place]  . Number of children: 3  . Years of education: Not on file  . Highest education level: Not on file  Occupational History  . 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   Social Needs  . Financial resource strain: Not on file  . Food insecurity    Worry: Not on file    Inability: Not on file  . Transportation needs    Medical: Not on file    Non-medical: Not on file  Tobacco Use  . Smoking status: Never Smoker  . Smokeless tobacco: Never Used  Substance and Sexual Activity  . Alcohol use: No  . Drug use: No  . Sexual activity: Yes    Partners: Male    Comment: adopted  Lifestyle  . Physical activity    Days per week: Not on file    Minutes per session: Not on file  . Stress: Not on file  Relationships  . Social Herbalist on  phone: Not on file    Gets together: Not on file    Attends religious service: Not on file    Active member of club or organization: Not on file    Attends meetings of clubs or organizations: Not on file    Relationship status: Not on file  Other Topics Concern  . Not on file  Social History Narrative   Caffeine daily. No regular exercise.  She was adopted.    Family History: Family History  Problem Relation Age of Onset  . Melanoma Mother   . Depression Mother   . Hyperlipidemia Mother   . Colon cancer Maternal Grandmother   . Colon cancer Father 33       deceased.    Allergies: Allergies  Allergen Reactions  . Cymbalta [Duloxetine Hcl] Other (See Comments)    Jolting/jerking sensations.   . Sulfonamide Derivatives Hives  . Tape Rash    Use paper tape   Medications: See med rec.  Review of Systems: No fevers, chills, night sweats, weight loss, chest pain, or shortness of breath.   Objective:    General: Well Developed, well nourished, and in no acute distress.  Neuro: Alert and oriented x3, extra-ocular muscles  intact, sensation grossly intact.  HEENT: Normocephalic, atraumatic, pupils equal round reactive to light, neck supple, no masses, no lymphadenopathy, thyroid nonpalpable.  Skin: Warm and dry, no rashes. Cardiac: Regular rate and rhythm, no murmurs rubs or gallops, no lower extremity edema.  Respiratory: Clear to auscultation bilaterally. Not using accessory muscles, speaking in full sentences. Back Exam:  Inspection: Unremarkable  Motion: Flexion 45 deg, Extension 45 deg, Side Bending to 45 deg bilaterally,  Rotation to 45 deg bilaterally  SLR laying: Negative  XSLR laying: Negative  Palpable tenderness: None. FABER: negative. Sensory change: Gross sensation intact to all lumbar and sacral dermatomes.  Reflexes: 2+ at both patellar tendons, 2+ at achilles tendons, Babinski's downgoing.  Strength at foot  Plantar-flexion: 5/5 Dorsi-flexion: 5/5 Eversion:  5/5 Inversion: 5/5  Leg strength  Quad: 5/5 Hamstring: 5/5 Hip flexor: 5/5 Hip abductors: 5/5  Gait unremarkable.  Procedure: Real-time Ultrasound Guided injection of the right SI joint Device: GE Logiq E  Verbal informed consent obtained.  Time-out conducted.  Noted no overlying erythema, induration, or other signs of local infection.  Skin prepped in a sterile fashion.  Local anesthesia: Topical Ethyl chloride.  With sterile technique and under real time ultrasound guidance:  1 cc Kenalog 40, 2 cc lidocaine, 2 cc bupivacaine injected easily Completed without difficulty  Pain immediately resolved suggesting accurate placement of the medication.  Advised to call if fevers/chills, erythema, induration, drainage, or persistent bleeding.  Images permanently stored and available for review in the ultrasound unit.  Impression: Technically successful ultrasound guided injection.  Impression and Recommendations:    Chronic right SI joint pain Excellent response to SI joint injection back in 2015, now with recurrence of right-sided SI joint pain radiating into the buttock, thigh but not past the knee. Repeat right SI joint injection today, return as needed.   ___________________________________________ Gwen Her. Dianah Field, M.D., ABFM., CAQSM. Primary Care and Sports Medicine [place] [facility]  Adjunct Professor of [place] of [facility] of Medicine

## 2018-12-04 ENCOUNTER — Other Ambulatory Visit: Payer: Self-pay

## 2018-12-04 ENCOUNTER — Ambulatory Visit
Admission: RE | Admit: 2018-12-04 | Discharge: 2018-12-04 | Disposition: A | Discharge status: Home / Self Care | Payer: BC Managed Care – PPO | Source: Ambulatory Visit | Attending: Sports Medicine | Admitting: Sports Medicine

## 2018-12-04 DIAGNOSIS — M542 Cervicalgia: Secondary | ICD-10-CM | POA: Diagnosis not present

## 2018-12-04 DIAGNOSIS — M5412 Radiculopathy, cervical region: Secondary | ICD-10-CM

## 2018-12-04 MED ORDER — TRIAMCINOLONE ACETONIDE 40 MG/ML IJ SUSP (RADIOLOGY): 60.0000 mg | Freq: Once | INTRAMUSCULAR | Status: DC

## 2018-12-04 MED ORDER — IOPAMIDOL (ISOVUE-M 300) INJECTION 61%: 1.0000 mL | Freq: Once | INTRAMUSCULAR | Status: DC

## 2018-12-14 ENCOUNTER — Other Ambulatory Visit: Payer: Self-pay | Admitting: Family Medicine

## 2018-12-16 NOTE — Assessment & Plan Note (Signed)
Right SI joint injection back on the 10th has not provided good relief, we are going to proceed with MRI of the pelvis looking for SI joint T2 signal and stress injury.

## 2018-12-16 NOTE — Addendum Note (Signed)
Addended by: Silverio Decamp on: 12/16/2018 04:58 PM   Modules accepted: Orders

## 2018-12-21 ENCOUNTER — Ambulatory Visit (INDEPENDENT_AMBULATORY_CARE_PROVIDER_SITE_OTHER): Payer: BC Managed Care – PPO

## 2018-12-21 ENCOUNTER — Other Ambulatory Visit: Payer: Self-pay

## 2018-12-21 DIAGNOSIS — M533 Sacrococcygeal disorders, not elsewhere classified: Secondary | ICD-10-CM

## 2018-12-21 DIAGNOSIS — G8929 Other chronic pain: Secondary | ICD-10-CM

## 2018-12-21 DIAGNOSIS — M5127 Other intervertebral disc displacement, lumbosacral region: Secondary | ICD-10-CM | POA: Diagnosis not present

## 2018-12-21 DIAGNOSIS — R531 Weakness: Secondary | ICD-10-CM | POA: Diagnosis not present

## 2018-12-22 ENCOUNTER — Encounter: Payer: Self-pay | Admitting: Sports Medicine

## 2018-12-22 DIAGNOSIS — M47816 Spondylosis without myelopathy or radiculopathy, lumbar region: Secondary | ICD-10-CM

## 2018-12-22 DIAGNOSIS — M9901 Segmental and somatic dysfunction of cervical region: Secondary | ICD-10-CM | POA: Diagnosis not present

## 2018-12-22 DIAGNOSIS — M130 Polyarthritis, unspecified: Secondary | ICD-10-CM

## 2018-12-22 DIAGNOSIS — M9904 Segmental and somatic dysfunction of sacral region: Secondary | ICD-10-CM | POA: Diagnosis not present

## 2018-12-22 DIAGNOSIS — M9903 Segmental and somatic dysfunction of lumbar region: Secondary | ICD-10-CM | POA: Diagnosis not present

## 2018-12-22 DIAGNOSIS — M9902 Segmental and somatic dysfunction of thoracic region: Secondary | ICD-10-CM | POA: Diagnosis not present

## 2018-12-22 NOTE — Assessment & Plan Note (Signed)
Historically right L5-S1 interlaminar epidural with Dr. Francesco Runner provided good relief. Recent pelvic MRI is showing persistence of the right L5-S1 disc protrusion coming close to the right L5 nerve root, there is also right L5-S1 facet arthropathy, both of which can be interventional targets. SI joint injections have exhibited waning efficacy.

## 2018-12-23 DIAGNOSIS — M9901 Segmental and somatic dysfunction of cervical region: Secondary | ICD-10-CM | POA: Diagnosis not present

## 2018-12-23 DIAGNOSIS — M9903 Segmental and somatic dysfunction of lumbar region: Secondary | ICD-10-CM | POA: Diagnosis not present

## 2018-12-24 ENCOUNTER — Other Ambulatory Visit: Payer: Self-pay | Admitting: Family Medicine

## 2018-12-25 ENCOUNTER — Other Ambulatory Visit: Payer: Self-pay | Admitting: Family Medicine

## 2018-12-29 DIAGNOSIS — M9903 Segmental and somatic dysfunction of lumbar region: Secondary | ICD-10-CM | POA: Diagnosis not present

## 2018-12-29 DIAGNOSIS — M9901 Segmental and somatic dysfunction of cervical region: Secondary | ICD-10-CM | POA: Diagnosis not present

## 2018-12-30 DIAGNOSIS — M9903 Segmental and somatic dysfunction of lumbar region: Secondary | ICD-10-CM | POA: Diagnosis not present

## 2018-12-30 DIAGNOSIS — M9901 Segmental and somatic dysfunction of cervical region: Secondary | ICD-10-CM | POA: Diagnosis not present

## 2018-12-31 DIAGNOSIS — M9903 Segmental and somatic dysfunction of lumbar region: Secondary | ICD-10-CM | POA: Diagnosis not present

## 2018-12-31 DIAGNOSIS — M9901 Segmental and somatic dysfunction of cervical region: Secondary | ICD-10-CM | POA: Diagnosis not present

## 2019-01-01 DIAGNOSIS — M9903 Segmental and somatic dysfunction of lumbar region: Secondary | ICD-10-CM | POA: Diagnosis not present

## 2019-01-01 DIAGNOSIS — M9901 Segmental and somatic dysfunction of cervical region: Secondary | ICD-10-CM | POA: Diagnosis not present

## 2019-01-03 ENCOUNTER — Other Ambulatory Visit: Payer: Self-pay | Admitting: Sports Medicine

## 2019-01-03 DIAGNOSIS — M47816 Spondylosis without myelopathy or radiculopathy, lumbar region: Secondary | ICD-10-CM

## 2019-01-05 DIAGNOSIS — M9901 Segmental and somatic dysfunction of cervical region: Secondary | ICD-10-CM | POA: Diagnosis not present

## 2019-01-05 DIAGNOSIS — M9903 Segmental and somatic dysfunction of lumbar region: Secondary | ICD-10-CM | POA: Diagnosis not present

## 2019-01-05 DIAGNOSIS — D510 Vitamin B12 deficiency anemia due to intrinsic factor deficiency: Secondary | ICD-10-CM | POA: Diagnosis not present

## 2019-01-05 DIAGNOSIS — M130 Polyarthritis, unspecified: Secondary | ICD-10-CM | POA: Diagnosis not present

## 2019-01-06 ENCOUNTER — Encounter: Payer: Self-pay | Admitting: Family Medicine

## 2019-01-06 DIAGNOSIS — Z1239 Encounter for other screening for malignant neoplasm of breast: Secondary | ICD-10-CM

## 2019-01-06 DIAGNOSIS — M9901 Segmental and somatic dysfunction of cervical region: Secondary | ICD-10-CM | POA: Diagnosis not present

## 2019-01-06 DIAGNOSIS — M9903 Segmental and somatic dysfunction of lumbar region: Secondary | ICD-10-CM | POA: Diagnosis not present

## 2019-01-06 LAB — VITAMIN B12: Vitamin B-12: 622 pg/mL (ref 200–1100)

## 2019-01-06 NOTE — Telephone Encounter (Signed)
Please order mamm.  Space B12 to Q 4weeks.

## 2019-01-07 DIAGNOSIS — M9901 Segmental and somatic dysfunction of cervical region: Secondary | ICD-10-CM | POA: Diagnosis not present

## 2019-01-07 DIAGNOSIS — M9903 Segmental and somatic dysfunction of lumbar region: Secondary | ICD-10-CM | POA: Diagnosis not present

## 2019-01-08 DIAGNOSIS — M9901 Segmental and somatic dysfunction of cervical region: Secondary | ICD-10-CM | POA: Diagnosis not present

## 2019-01-08 DIAGNOSIS — M9903 Segmental and somatic dysfunction of lumbar region: Secondary | ICD-10-CM | POA: Diagnosis not present

## 2019-01-11 LAB — ANA, IFA COMPREHENSIVE PANEL
Anti Nuclear Antibody (ANA): POSITIVE — AB
ENA SM Ab Ser-aCnc: <1 AI
SM/RNP: <1 AI
SSA (Ro) (ENA) Antibody, IgG: <1 AI
SSB (La) (ENA) Antibody, IgG: <1 AI
Scleroderma (Scl-70) (ENA) Antibody, IgG: <1 AI
ds DNA Ab: <1 IU/mL

## 2019-01-11 LAB — ANTI-NUCLEAR AB-TITER (ANA TITER)
ANA TITER: 1:40 {titer} — ABNORMAL HIGH
ANA TITER: 1:80 {titer} — ABNORMAL HIGH
ANA Titer 1: 1:80 {titer} — ABNORMAL HIGH

## 2019-01-11 LAB — SEDIMENTATION RATE: Sed Rate: 9 mm/h (ref 0–20)

## 2019-01-11 LAB — RHEUMATOID ARTHRITIS DIAGNOSTIC PANEL, COMPREHENSIVE
Cyclic Citrullin Peptide Ab: <16 Units (ref <20)
Rheumatoid Factor (IgA): <5 U (ref <=6)
Rheumatoid Factor (IgG): <5 U (ref <=6)
Rheumatoid Factor (IgM): <5 U (ref <=6)
SSA (Ro) (ENA) Antibody, IgG: <1 AI
SSB (La) (ENA) Antibody, IgG: <1 AI

## 2019-01-11 LAB — 14-3-3 ETA PROTEIN: 14-3-3 eta Protein: <0.2 ng/mL (ref <0.2)

## 2019-01-11 LAB — HLA-B27 ANTIGEN: HLA-B27 Antigen: NEGATIVE

## 2019-01-11 LAB — CK: Total CK: 50 U/L (ref 29–143)

## 2019-01-11 LAB — URIC ACID: Uric Acid, Serum: 6.2 mg/dL (ref 2.5–7.0)

## 2019-01-12 DIAGNOSIS — M9901 Segmental and somatic dysfunction of cervical region: Secondary | ICD-10-CM | POA: Diagnosis not present

## 2019-01-12 DIAGNOSIS — M9903 Segmental and somatic dysfunction of lumbar region: Secondary | ICD-10-CM | POA: Diagnosis not present

## 2019-01-13 DIAGNOSIS — M9903 Segmental and somatic dysfunction of lumbar region: Secondary | ICD-10-CM | POA: Diagnosis not present

## 2019-01-13 DIAGNOSIS — M9901 Segmental and somatic dysfunction of cervical region: Secondary | ICD-10-CM | POA: Diagnosis not present

## 2019-01-19 DIAGNOSIS — M9903 Segmental and somatic dysfunction of lumbar region: Secondary | ICD-10-CM | POA: Diagnosis not present

## 2019-01-19 DIAGNOSIS — M9901 Segmental and somatic dysfunction of cervical region: Secondary | ICD-10-CM | POA: Diagnosis not present

## 2019-01-20 DIAGNOSIS — M9901 Segmental and somatic dysfunction of cervical region: Secondary | ICD-10-CM | POA: Diagnosis not present

## 2019-01-20 DIAGNOSIS — M9903 Segmental and somatic dysfunction of lumbar region: Secondary | ICD-10-CM | POA: Diagnosis not present

## 2019-02-01 ENCOUNTER — Other Ambulatory Visit: Payer: Self-pay | Admitting: Sports Medicine

## 2019-02-01 DIAGNOSIS — M47816 Spondylosis without myelopathy or radiculopathy, lumbar region: Secondary | ICD-10-CM

## 2019-02-02 DIAGNOSIS — M47816 Spondylosis without myelopathy or radiculopathy, lumbar region: Secondary | ICD-10-CM

## 2019-02-02 DIAGNOSIS — M9901 Segmental and somatic dysfunction of cervical region: Secondary | ICD-10-CM | POA: Diagnosis not present

## 2019-02-02 DIAGNOSIS — M9903 Segmental and somatic dysfunction of lumbar region: Secondary | ICD-10-CM | POA: Diagnosis not present

## 2019-02-03 DIAGNOSIS — M9901 Segmental and somatic dysfunction of cervical region: Secondary | ICD-10-CM | POA: Diagnosis not present

## 2019-02-03 DIAGNOSIS — M9903 Segmental and somatic dysfunction of lumbar region: Secondary | ICD-10-CM | POA: Diagnosis not present

## 2019-02-05 DIAGNOSIS — M9903 Segmental and somatic dysfunction of lumbar region: Secondary | ICD-10-CM | POA: Diagnosis not present

## 2019-02-05 DIAGNOSIS — M9901 Segmental and somatic dysfunction of cervical region: Secondary | ICD-10-CM | POA: Diagnosis not present

## 2019-02-09 ENCOUNTER — Other Ambulatory Visit: Payer: Self-pay | Admitting: Family Medicine

## 2019-02-09 DIAGNOSIS — M9903 Segmental and somatic dysfunction of lumbar region: Secondary | ICD-10-CM | POA: Diagnosis not present

## 2019-02-09 DIAGNOSIS — M9901 Segmental and somatic dysfunction of cervical region: Secondary | ICD-10-CM | POA: Diagnosis not present

## 2019-02-10 DIAGNOSIS — M9903 Segmental and somatic dysfunction of lumbar region: Secondary | ICD-10-CM | POA: Diagnosis not present

## 2019-02-10 DIAGNOSIS — M9901 Segmental and somatic dysfunction of cervical region: Secondary | ICD-10-CM | POA: Diagnosis not present

## 2019-02-12 DIAGNOSIS — M9903 Segmental and somatic dysfunction of lumbar region: Secondary | ICD-10-CM | POA: Diagnosis not present

## 2019-02-12 DIAGNOSIS — M9901 Segmental and somatic dysfunction of cervical region: Secondary | ICD-10-CM | POA: Diagnosis not present

## 2019-02-16 DIAGNOSIS — M9903 Segmental and somatic dysfunction of lumbar region: Secondary | ICD-10-CM | POA: Diagnosis not present

## 2019-02-16 DIAGNOSIS — M9904 Segmental and somatic dysfunction of sacral region: Secondary | ICD-10-CM | POA: Diagnosis not present

## 2019-02-16 DIAGNOSIS — M9901 Segmental and somatic dysfunction of cervical region: Secondary | ICD-10-CM | POA: Diagnosis not present

## 2019-02-16 DIAGNOSIS — M9902 Segmental and somatic dysfunction of thoracic region: Secondary | ICD-10-CM | POA: Diagnosis not present

## 2019-02-17 DIAGNOSIS — M9903 Segmental and somatic dysfunction of lumbar region: Secondary | ICD-10-CM | POA: Diagnosis not present

## 2019-02-17 DIAGNOSIS — M9901 Segmental and somatic dysfunction of cervical region: Secondary | ICD-10-CM | POA: Diagnosis not present

## 2019-02-19 DIAGNOSIS — M9903 Segmental and somatic dysfunction of lumbar region: Secondary | ICD-10-CM | POA: Diagnosis not present

## 2019-02-19 DIAGNOSIS — M9901 Segmental and somatic dysfunction of cervical region: Secondary | ICD-10-CM | POA: Diagnosis not present

## 2019-02-23 DIAGNOSIS — M9903 Segmental and somatic dysfunction of lumbar region: Secondary | ICD-10-CM | POA: Diagnosis not present

## 2019-02-23 DIAGNOSIS — M9901 Segmental and somatic dysfunction of cervical region: Secondary | ICD-10-CM | POA: Diagnosis not present

## 2019-02-25 DIAGNOSIS — M9901 Segmental and somatic dysfunction of cervical region: Secondary | ICD-10-CM | POA: Diagnosis not present

## 2019-02-25 DIAGNOSIS — M9903 Segmental and somatic dysfunction of lumbar region: Secondary | ICD-10-CM | POA: Diagnosis not present

## 2019-02-26 DIAGNOSIS — M9903 Segmental and somatic dysfunction of lumbar region: Secondary | ICD-10-CM | POA: Diagnosis not present

## 2019-02-26 DIAGNOSIS — M9902 Segmental and somatic dysfunction of thoracic region: Secondary | ICD-10-CM | POA: Diagnosis not present

## 2019-02-26 DIAGNOSIS — M9901 Segmental and somatic dysfunction of cervical region: Secondary | ICD-10-CM | POA: Diagnosis not present

## 2019-02-26 DIAGNOSIS — M9904 Segmental and somatic dysfunction of sacral region: Secondary | ICD-10-CM | POA: Diagnosis not present

## 2019-03-02 DIAGNOSIS — M9901 Segmental and somatic dysfunction of cervical region: Secondary | ICD-10-CM | POA: Diagnosis not present

## 2019-03-02 DIAGNOSIS — M9903 Segmental and somatic dysfunction of lumbar region: Secondary | ICD-10-CM | POA: Diagnosis not present

## 2019-03-02 DIAGNOSIS — M9904 Segmental and somatic dysfunction of sacral region: Secondary | ICD-10-CM | POA: Diagnosis not present

## 2019-03-02 DIAGNOSIS — M9902 Segmental and somatic dysfunction of thoracic region: Secondary | ICD-10-CM | POA: Diagnosis not present

## 2019-03-03 DIAGNOSIS — M9902 Segmental and somatic dysfunction of thoracic region: Secondary | ICD-10-CM | POA: Diagnosis not present

## 2019-03-03 DIAGNOSIS — M9904 Segmental and somatic dysfunction of sacral region: Secondary | ICD-10-CM | POA: Diagnosis not present

## 2019-03-03 DIAGNOSIS — M9901 Segmental and somatic dysfunction of cervical region: Secondary | ICD-10-CM | POA: Diagnosis not present

## 2019-03-03 DIAGNOSIS — M9903 Segmental and somatic dysfunction of lumbar region: Secondary | ICD-10-CM | POA: Diagnosis not present

## 2019-03-04 ENCOUNTER — Other Ambulatory Visit: Payer: Self-pay | Admitting: Sports Medicine

## 2019-03-04 DIAGNOSIS — M47816 Spondylosis without myelopathy or radiculopathy, lumbar region: Secondary | ICD-10-CM

## 2019-03-05 DIAGNOSIS — M9903 Segmental and somatic dysfunction of lumbar region: Secondary | ICD-10-CM | POA: Diagnosis not present

## 2019-03-05 DIAGNOSIS — M9902 Segmental and somatic dysfunction of thoracic region: Secondary | ICD-10-CM | POA: Diagnosis not present

## 2019-03-05 DIAGNOSIS — M9904 Segmental and somatic dysfunction of sacral region: Secondary | ICD-10-CM | POA: Diagnosis not present

## 2019-03-05 DIAGNOSIS — M9901 Segmental and somatic dysfunction of cervical region: Secondary | ICD-10-CM | POA: Diagnosis not present

## 2019-03-09 ENCOUNTER — Ambulatory Visit (INDEPENDENT_AMBULATORY_CARE_PROVIDER_SITE_OTHER): Payer: BC Managed Care – PPO | Admitting: Sports Medicine

## 2019-03-09 ENCOUNTER — Other Ambulatory Visit: Payer: Self-pay

## 2019-03-09 DIAGNOSIS — M9903 Segmental and somatic dysfunction of lumbar region: Secondary | ICD-10-CM | POA: Diagnosis not present

## 2019-03-09 DIAGNOSIS — M9902 Segmental and somatic dysfunction of thoracic region: Secondary | ICD-10-CM | POA: Diagnosis not present

## 2019-03-09 DIAGNOSIS — M47816 Spondylosis without myelopathy or radiculopathy, lumbar region: Secondary | ICD-10-CM | POA: Diagnosis not present

## 2019-03-09 DIAGNOSIS — M130 Polyarthritis, unspecified: Secondary | ICD-10-CM | POA: Diagnosis not present

## 2019-03-09 DIAGNOSIS — M9901 Segmental and somatic dysfunction of cervical region: Secondary | ICD-10-CM | POA: Diagnosis not present

## 2019-03-09 DIAGNOSIS — M9904 Segmental and somatic dysfunction of sacral region: Secondary | ICD-10-CM | POA: Diagnosis not present

## 2019-03-09 MED ORDER — TRAMADOL HCL 50 MG PO TABS: 50.0000 mg | ORAL_TABLET | Freq: Three times a day (TID) | ORAL | 0 refills | Status: DC | PRN

## 2019-03-09 NOTE — Progress Notes (Signed)
    Procedures performed today:    Procedure: Real-time Ultrasound Guided injection of the left third proximal interphalangeal joint Device: Samsung HS60  Verbal informed consent obtained.  Time-out conducted.  Noted no overlying erythema, induration, or other signs of local infection.  Skin prepped in a sterile fashion.  Local anesthesia: Topical Ethyl chloride.  With sterile technique and under real time ultrasound guidance:  1/2 cc lidocaine, 1/2 cc Kenalog 40 injected easily  completed without difficulty  Pain immediately resolved suggesting accurate placement of the medication.  Advised to call if fevers/chills, erythema, induration, drainage, or persistent bleeding.  Images permanently stored and available for review in the ultrasound unit.  Impression: Technically successful ultrasound guided injection.  Independent interpretation of tests performed by another provider:   None.  Impression and Recommendations:    Polyarthritis with left third PIP osteoarthritis Recurrence of pain at the left third proximal interphalangeal joint. Repeat left third PIP injection. Previous injection was in August 2020.  Lumbar spondylosis Jenny Reichmann has typically done well with right L5-S1 interlaminar epidurals with Dr. Francesco Runner, she also had a recent pelvic MRI showing persistence of the right L5-S1 distribution, close to the right L5 nerve root, there is also a right L5-S1 facet arthropathy, both of which can be interventional targets in the future. Historically as well SI joint injections have exhibit waning efficacy. She is going to do 4 to 6 weeks of aggressive formal physical therapy before we consider repeat intervention. I am going to add some tramadol to keep her pain and pain in the meantime.    ___________________________________________ Gwen Her. Dianah Field, M.D., ABFM., CAQSM. Primary Care and [place] Instructor of [place] [employer] [facility] of Medicine

## 2019-03-09 NOTE — Assessment & Plan Note (Signed)
Recurrence of pain at the left third proximal interphalangeal joint. Repeat left third PIP injection. Previous injection was in August 2020.

## 2019-03-09 NOTE — Assessment & Plan Note (Signed)
Carly [name] has typically done well with right L5-S1 interlaminar epidurals with Dr. Francesco Runner, she also had a recent pelvic MRI showing persistence of the right L5-S1 distribution, close to the right L5 nerve root, there is also a right L5-S1 facet arthropathy, both of which can be interventional targets in the future. Historically as well SI joint injections have exhibit waning efficacy. She is going to do 4 to 6 weeks of aggressive formal physical therapy before we consider repeat intervention. I am going to add some tramadol to keep her pain and pain in the meantime.

## 2019-03-11 ENCOUNTER — Encounter: Payer: Self-pay | Admitting: Family Medicine

## 2019-03-11 DIAGNOSIS — M9901 Segmental and somatic dysfunction of cervical region: Secondary | ICD-10-CM | POA: Diagnosis not present

## 2019-03-11 DIAGNOSIS — M9903 Segmental and somatic dysfunction of lumbar region: Secondary | ICD-10-CM | POA: Diagnosis not present

## 2019-03-11 DIAGNOSIS — M9902 Segmental and somatic dysfunction of thoracic region: Secondary | ICD-10-CM | POA: Diagnosis not present

## 2019-03-11 DIAGNOSIS — M9904 Segmental and somatic dysfunction of sacral region: Secondary | ICD-10-CM | POA: Diagnosis not present

## 2019-03-13 ENCOUNTER — Other Ambulatory Visit: Payer: Self-pay

## 2019-03-13 ENCOUNTER — Ambulatory Visit (INDEPENDENT_AMBULATORY_CARE_PROVIDER_SITE_OTHER): Payer: BC Managed Care – PPO | Admitting: Rehabilitative and Restorative Service Providers"

## 2019-03-13 ENCOUNTER — Other Ambulatory Visit: Payer: Self-pay | Admitting: Family Medicine

## 2019-03-13 DIAGNOSIS — M6281 Muscle weakness (generalized): Secondary | ICD-10-CM

## 2019-03-13 DIAGNOSIS — M5440 Lumbago with sciatica, unspecified side: Secondary | ICD-10-CM

## 2019-03-13 DIAGNOSIS — R29818 Other symptoms and signs involving the nervous system: Secondary | ICD-10-CM

## 2019-03-13 MED ORDER — ZOLPIDEM TARTRATE 10 MG PO TABS: 10.0000 mg | ORAL_TABLET | Freq: Every day | ORAL | 0 refills | Status: DC

## 2019-03-13 NOTE — Therapy (Signed)
[facility] [place], [place], [address] Phone: [phone]   Fax:  [phone]  Physical Therapy Evaluation  Patient Details  Name: Carly [name] MRN: [id number] Date of Birth: October 06, 1970 Referring Provider (PT): Silverio Decamp, MD   Encounter Date: 03/13/2019  PT End of Session - 03/13/19 1147    Visit Number  1    Number of Visits  12    Date for PT Re-Evaluation  04/24/19    Authorization Type  BCBS    PT Start Time  0800    PT Stop Time  0855    PT Time Calculation (min)  55 min    Activity Tolerance  Patient tolerated treatment well;Patient limited by pain    Behavior During Therapy  [facility] for tasks assessed/performed       Past Medical History:  Diagnosis Date  . Arthritis   . Hypertension   . PCOS (polycystic ovarian syndrome)   . Thyroid disease   . TIA (transient ischemic attack)     Past Surgical History:  Procedure Laterality Date  . 2 ectopic pregnancies  [id number]  . lymph nodes    . melanoma removal     RT SIDE  . removal of fallopian tube  1999    There were no vitals filed for this visit.   Subjective Assessment - 03/13/19 0808    Subjective  The patient reports insidious onset of low back pain in Aug-Sept of 2020 worse after staying in bed with Covid in the month of October.  Her symptoms are in lower SI region and radiate into the R gluteal region. Sitting and night time are worse times and when she first rises after sitting.  Radiating symptoms go to mid posterior thigh.    Pertinent History  PCOS, HTN, allergies    Patient Stated Goals  Decrease or eliminate pain.    Currently in Pain?  Yes    Pain Score  1    sits propped on the left hip to avoid R gluteal pain   Pain Location  Back    Pain Orientation  Right    Pain Descriptors / Indicators  Aching;Sharp;Shooting    Pain Radiating Towards  R gluteal region    Pain Onset  More than a month ago    Pain  Frequency  Constant   varies in intensity   Aggravating Factors   8/10 at its worst    Pain Relieving Factors  changing positions; epidurals         [facility] PT Assessment - 03/13/19 0843      Assessment   Medical Diagnosis  lumbar spondylosis L5-S1    Referring Provider (PT)  Silverio Decamp, MD    Onset Date/Surgical Date  --   08/2018   Hand Dominance  Right    Prior Therapy  none      Precautions   Precautions  None      Restrictions   Weight Bearing Restrictions  No      Balance Screen   Has the patient fallen in the past 6 months  No    Has the patient had a decrease in activity level because of a fear of falling?   No    Is the patient reluctant to leave their home because of a fear of falling?   No      Home Environment   Living Environment  Private residence      Observation/Other Assessments  Focus on Therapeutic Outcomes (FOTO)   60% (40% limitation)      Sensation   Additional Comments  R LE tingling intermittent and heavy "like it's waking up" sensation to the toes      Posture/Postural Control   Posture/Postural Control  Postural limitations      ROM / Strength   AROM / PROM / Strength  AROM;Strength      AROM   Lumbar Flexion  50% limited    unable to get back up gower's sign, stays hunched, knees ben   Lumbar Extension  75% limited   R gluteal/ proximal HS pain   Lumbar - Right Side Bend  25% limited    Lumbar - Left Side Bend  25% limited    Lumbar - Right Rotation  25% limited    Lumbar - Left Rotation  25% limited      Strength   Overall Strength  Deficits    Strength Assessment Site  Hip;Knee;Ankle    Right/Left Hip  Right;Left    Right Hip Flexion  4/5    Left Hip Flexion  5/5    Right/Left Knee  Right;Left    Right Knee Flexion  5/5    Right Knee Extension  5/5    Left Knee Flexion  5/5    Left Knee Extension  5/5    Right/Left Ankle  Right;Left    Right Ankle Dorsiflexion  5/5    Left Ankle Dorsiflexion  5/5       Flexibility   Soft Tissue Assessment /Muscle Length  yes    Hamstrings  beginning position of 90/90 hip and knee flexion, patient has -50 degrees extension R LE and -45 degrees L LE      Palpation   SI assessment   R PSIS tender to touch, also tender along lateral border of R sacrum and into sciatic notch    Palpation comment  pain with palpation of the R QL      Special Tests    Special Tests  Sacrolliac Tests    Sacroiliac Tests   Pelvic Compression      Pelvic Compression   Findings  Positive    Side  Left    comment  painful      Ambulation/Gait   Ambulation/Gait  Yes    Ambulation/Gait Assistance  7: Independent                Objective measurements completed on examination: See above findings.      [facility] Adult PT Treatment/Exercise - 03/13/19 0843      Exercises   Exercises  Lumbar      Lumbar Exercises: Stretches   Active Hamstring Stretch  Right;Left;1 rep;30 seconds    Piriformis Stretch  2 reps;30 seconds      Modalities   Modalities  Traction      Traction   Type of Traction  Lumbar    Min (lbs)  30    Max (lbs)  45    Hold Time  60    Rest Time  20    Time  15 minutes      Manual Therapy   Manual therapy comments  --             PT Education - 03/13/19 1145    Education Details  HEP initiated for stretching    Person(s) Educated  Patient    Methods  Explanation;Handout    Comprehension  Returned demonstration;Verbalized understanding  PT Long Term Goals - 03/13/19 1147      PT LONG TERM GOAL #1   Title  The patient will be indep with initial HEP.    Time  6    Period  Weeks    Target Date  04/24/19      PT LONG TERM GOAL #2   Title  The patient will reduce disability per FOTO from 40% limitation to < or equal to 30% limitation.    Baseline  60% FOTO score    Time  6    Period  Weeks    Target Date  04/24/19      PT LONG TERM GOAL #3   Title  The patient will tolerate lumbar flexion to reach the floor  without R sacral pain.    Time  6    Period  Weeks    Target Date  04/24/19      PT LONG TERM GOAL #4   Title  The patient will  be able to sit in a chair without R gluteal pain and without compensatory L leaning.    Time  6    Period  Weeks    Target Date  04/24/19      PT LONG TERM GOAL #5   Title  The patient will report pain at worst < or equal to 4/10.    Baseline  8/10 at worst    Time  6    Period  Weeks    Target Date  04/24/19             Plan - 03/13/19 1148    Clinical Impression Statement  The patient is a 49 year old female presenting to OP physical therapy with worsening presentation of lumbar pain with R radiating symptoms.  She presents with impairments in lumbar ROM, pain with palpation of sacral border R, tightness and pain with palpation R quadratus lumborum, decreased flexibility hamstrings and piriformis, and sensory changes to the R foot.  Patient has functional limitations of dec'd tolerance to sitting, difficulty sleeping, and decreased participation in exercise due to pain.  PT to address in order to return to prior level of function.    Personal Factors and Comorbidities  Comorbidity 1    Comorbidities  HTN    Examination-Activity Limitations  Locomotion Level;Squat;Sleep;Stand;Sit;Lift    Examination-Participation Restrictions  Community Activity    Stability/Clinical Decision Making  Stable/Uncomplicated    Clinical Decision Making  Low    Rehab Potential  Good    PT Frequency  2x / week    PT Duration  6 weeks    PT Treatment/Interventions  ADLs/Self Care Home Management;Gait training;Stair training;Functional mobility training;Therapeutic activities;Therapeutic exercise;Neuromuscular re-education;Electrical Stimulation;Traction;Moist Heat;Iontophoresis 4mg /ml Dexamethasone;Cryotherapy;Dry needling;Taping;Manual techniques;Patient/family education    PT Next Visit Plan  progress HEP to tolerance, continue traction, lumbar stabilization, STM and foam  rolling R piriformis, SI joint mobility, quadratus lumborum STM, home stretching    PT Home Exercise Plan  Access Code: [email or web address]    Consulted and Agree with Plan of Care  Patient       Patient will benefit from skilled therapeutic intervention in order to improve the following deficits and impairments:  Hypomobility, Pain, Postural dysfunction, Impaired sensation, Impaired flexibility, Decreased strength, Decreased range of motion, Decreased activity tolerance  Visit Diagnosis: Acute right-sided low back pain with sciatica, sciatica laterality unspecified  Muscle weakness (generalized)  Other symptoms and signs involving the nervous system     Problem List Patient Active Problem List  Diagnosis Date Noted  . COVID-19 10/27/2018  . B12 deficiency 02/27/2018  . Lymphadenopathy 12/29/2017  . History of melanoma 12/29/2017  . Skin sore 12/29/2017  . Radiculitis of right cervical region 12/03/2017  . Stress fracture of metatarsal bone of right foot 05/24/2017  . Asthma, mild persistent 10/04/2015  . Abnormal weight gain 01/20/2015  . SOB (shortness of breath) 05/31/2014  . Other fatigue 05/31/2014  . Tachycardia 05/31/2014  . Recurrent UTI 05/06/2014  . DUB (dysfunctional uterine bleeding) 05/06/2014  . Myofascial pain 08/03/2013  . Lumbar spondylosis 04/02/2013  . Migraine 02/13/2012  . Major depression, recurrent ([place]) 12/14/2011  . Hypothyroid 11/01/2010  . PCOS (polycystic ovarian syndrome) 11/01/2010  . Chronic right SI joint pain 11/01/2010  . Polyarthritis with left third PIP osteoarthritis 11/01/2010  . Melanoma ([place]) 11/01/2010  . Essential hypertension, benign 11/01/2010  . ANEMIA, IRON DEFICIENCY 10/17/2010  . INSOMNIA, CHRONIC 10/17/2010  . FATIGUE 10/17/2010  . EDEMA 10/17/2010    Blythe Hartshorn, PT 03/13/2019, 3:36 PM  [facility] [place], [place], [address] Phone: [phone]    Fax:  [phone]  Name: Carly [name] MRN: [id number] Date of Birth: February 06, 1970

## 2019-03-13 NOTE — Telephone Encounter (Signed)
Ok med sent

## 2019-03-13 NOTE — Telephone Encounter (Signed)
Pt advised..Kaira Stringfield Lynetta, CMA  

## 2019-03-13 NOTE — Telephone Encounter (Signed)
Patient states she needs a refill on zolpidem (AMBIEN) 10 MG tablet [id number]. Her last one is tomorrow and wanted to know if she can get a short supply until her visit on 03/1. Please advise.

## 2019-03-13 NOTE — Patient Instructions (Signed)
Access Code: [email or web address]  URL: [email or web address]  Date: 03/13/2019  Prepared by: Rudell Cobb   Exercises Supine Piriformis Stretch Pulling Heel to Hip - 3 reps - 1 sets - 30 seconds hold - 2x daily - 7x weekly Supine Hamstring Stretch - 3 reps - 1 sets - 30 seconds hold - 2x daily - 7x weekly

## 2019-03-15 ENCOUNTER — Other Ambulatory Visit: Payer: Self-pay | Admitting: Family Medicine

## 2019-03-16 ENCOUNTER — Ambulatory Visit (INDEPENDENT_AMBULATORY_CARE_PROVIDER_SITE_OTHER): Payer: BC Managed Care – PPO | Admitting: Rehabilitative and Restorative Service Providers"

## 2019-03-16 ENCOUNTER — Encounter: Payer: Self-pay | Admitting: Rehabilitative and Restorative Service Providers"

## 2019-03-16 ENCOUNTER — Other Ambulatory Visit: Payer: Self-pay

## 2019-03-16 DIAGNOSIS — M9904 Segmental and somatic dysfunction of sacral region: Secondary | ICD-10-CM | POA: Diagnosis not present

## 2019-03-16 DIAGNOSIS — M5440 Lumbago with sciatica, unspecified side: Secondary | ICD-10-CM

## 2019-03-16 DIAGNOSIS — M6281 Muscle weakness (generalized): Secondary | ICD-10-CM

## 2019-03-16 DIAGNOSIS — M9901 Segmental and somatic dysfunction of cervical region: Secondary | ICD-10-CM | POA: Diagnosis not present

## 2019-03-16 DIAGNOSIS — R29818 Other symptoms and signs involving the nervous system: Secondary | ICD-10-CM

## 2019-03-16 DIAGNOSIS — M9902 Segmental and somatic dysfunction of thoracic region: Secondary | ICD-10-CM | POA: Diagnosis not present

## 2019-03-16 DIAGNOSIS — M9903 Segmental and somatic dysfunction of lumbar region: Secondary | ICD-10-CM | POA: Diagnosis not present

## 2019-03-16 NOTE — Therapy (Signed)
[facility] [place], [place], [address] Phone: [phone]   Fax:  [phone]  Physical Therapy Treatment  Patient Details  Name: Carly [name] MRN: [id number] Date of Birth: 1970/10/24 Referring Provider (PT): Silverio Decamp, MD   Encounter Date: 03/16/2019  PT End of Session - 03/16/19 0715    Visit Number  2    Number of Visits  12    Date for PT Re-Evaluation  04/24/19    Authorization Type  BCBS    PT Start Time  0715    PT Stop Time  0802    PT Time Calculation (min)  47 min    Activity Tolerance  Patient tolerated treatment well       Past Medical History:  Diagnosis Date  . Arthritis   . Hypertension   . PCOS (polycystic ovarian syndrome)   . Thyroid disease   . TIA (transient ischemic attack)     Past Surgical History:  Procedure Laterality Date  . 2 ectopic pregnancies  [id number]  . lymph nodes    . melanoma removal     RT SIDE  . removal of fallopian tube  1999    There were no vitals filed for this visit.  Subjective Assessment - 03/16/19 0716    Subjective  No change  in sympotms- still painful. Working on her exercises at home.    Currently in Pain?  Yes    Pain Score  7     Pain Location  Back    Pain Orientation  Right    Pain Descriptors / Indicators  Constant;Aching;Dull;Sharp    Pain Type  Acute pain    Pain Radiating Towards  R posterior hip    Pain Onset  More than a month ago    Pain Frequency  Constant    Aggravating Factors   worse with sit to stand    Pain Relieving Factors  changing positions; epidurals                       [facility] Adult PT Treatment/Exercise - 03/16/19 0001      Therapeutic Activites    Therapeutic Activities  --   standing, supine and prone myofacial ball release      Lumbar Exercises: Stretches   Piriformis Stretch  2 reps;30 seconds   travell   Other Lumbar Stretch Exercise  lat stretch supine 30 sec x 3  PT assist with LE       Lumbar Exercises: Aerobic   Nustep  L5 x 4 min       Lumbar Exercises: Supine   Other Supine Lumbar Exercises  Deep breathing exercise in hooklying position; UE's in flexion resting on pillow by head - 5 sec inhale/5 sec hold/5 sec exhale x 5-10 reps       Moist Heat Therapy   Number Minutes Moist Heat  8 Minutes    Moist Heat Location  Lumbar Spine      Manual Therapy   Manual Therapy  Soft tissue mobilization;Myofascial release    Manual therapy comments  pr supine and prone     Soft tissue mobilization  deep tissue work iliopsoas; lats; posterior hip - piriformis and glut med/max     Myofascial Release  Rt lumbar musculature              PT Education - 03/16/19 0744    Education Details  HEP myofacial ball release work  Person(s) Educated  Patient    Methods  Explanation;Demonstration;Tactile cues;Verbal cues;Handout    Comprehension  Verbalized understanding;Returned demonstration;Verbal cues required;Tactile cues required          PT Long Term Goals - 03/13/19 1147      PT LONG TERM GOAL #1   Title  The patient will be indep with initial HEP.    Time  6    Period  Weeks    Target Date  04/24/19      PT LONG TERM GOAL #2   Title  The patient will reduce disability per FOTO from 40% limitation to < or equal to 30% limitation.    Baseline  60% FOTO score    Time  6    Period  Weeks    Target Date  04/24/19      PT LONG TERM GOAL #3   Title  The patient will tolerate lumbar flexion to reach the floor without R sacral pain.    Time  6    Period  Weeks    Target Date  04/24/19      PT LONG TERM GOAL #4   Title  The patient will  be able to sit in a chair without R gluteal pain and without compensatory L leaning.    Time  6    Period  Weeks    Target Date  04/24/19      PT LONG TERM GOAL #5   Title  The patient will report pain at worst < or equal to 4/10.    Baseline  8/10 at worst    Time  6    Period  Weeks    Target Date   04/24/19            Plan - 03/16/19 1544    Clinical Impression Statement  Patient returns with persistent chronic back pain. She has worked on exercises at home. Patient opted for manual work vs DN and lumbar traction in today's treatment. Added exercises and myofacial ball release work with pt in supine/prone. Patient tolerated deep tissue work through psoas musculature Rt when supine LE's supported - noted significant tightness and tenderness through this area. Also worked on transverse abdominals which were tight as well. Manual work to Rt > Lt lumbar paraspinals; QL; lats with pt in prone. Patient reported some soreness in musculature post treatment but demonstrated palpable decrease in muscular tightness. May benefit from DN. Discussed nature of chronic pain and expectations with PT/need for consistent home program. Patient will evaluate work station to improve ergonomics as possible - (ie. avoid repeated turning to Rt while seated).    Rehab Potential  Good    PT Frequency  2x / week    PT Duration  6 weeks    PT Treatment/Interventions  ADLs/Self Care Home Management;Gait training;Stair training;Functional mobility training;Therapeutic activities;Therapeutic exercise;Neuromuscular re-education;Electrical Stimulation;Traction;Moist Heat;Iontophoresis 4mg /ml Dexamethasone;Cryotherapy;Dry needling;Taping;Manual techniques;Patient/family education    PT Next Visit Plan  progress HEP to tolerance, continue traction as indicated, lumbar stabilization, STM/myofacial release techniques Rt piriformis/gluts/psoas, further assess SI joint mobility as indicated, quadratus lumborum/lats/hip flexors/psoas STM and possibly DN, modalities as indicated    PT Home Exercise Plan  [id number]    Consulted and Agree with Plan of Care  Patient       Patient will benefit from skilled therapeutic intervention in order to improve the following deficits and impairments:     Visit Diagnosis: Acute right-sided low  back pain with sciatica, sciatica laterality unspecified  Muscle  weakness (generalized)  Other symptoms and signs involving the nervous system     Problem List Patient Active Problem List   Diagnosis Date Noted  . COVID-19 10/27/2018  . B12 deficiency 02/27/2018  . Lymphadenopathy 12/29/2017  . History of melanoma 12/29/2017  . Skin sore 12/29/2017  . Radiculitis of right cervical region 12/03/2017  . Stress fracture of metatarsal bone of right foot 05/24/2017  . Asthma, mild persistent 10/04/2015  . Abnormal weight gain 01/20/2015  . SOB (shortness of breath) 05/31/2014  . Other fatigue 05/31/2014  . Tachycardia 05/31/2014  . Recurrent UTI 05/06/2014  . DUB (dysfunctional uterine bleeding) 05/06/2014  . Myofascial pain 08/03/2013  . Lumbar spondylosis 04/02/2013  . Migraine 02/13/2012  . Major depression, recurrent ([place]) 12/14/2011  . Hypothyroid 11/01/2010  . PCOS (polycystic ovarian syndrome) 11/01/2010  . Chronic right SI joint pain 11/01/2010  . Polyarthritis with left third PIP osteoarthritis 11/01/2010  . Melanoma ([place]) 11/01/2010  . Essential hypertension, benign 11/01/2010  . ANEMIA, IRON DEFICIENCY 10/17/2010  . INSOMNIA, CHRONIC 10/17/2010  . FATIGUE 10/17/2010  . EDEMA 10/17/2010    Emeterio Balke Nilda Simmer PT, MPH  03/16/2019, 3:54 PM  [facility] [place], [place], [address] Phone: [phone]   Fax:  [phone]  Name: Carly [name] MRN: [id number] Date of Birth: 06/07/1970

## 2019-03-16 NOTE — Patient Instructions (Addendum)
TENS UNIT: This is helpful for muscle pain and spasm.   Search and Purchase a TENS 7000 2nd edition at www.tenspros.com. It should be less than $30.     TENS unit instructions: Do not shower or bathe with the unit on Turn the unit off before removing electrodes or batteries If the electrodes lose stickiness add a drop of water to the electrodes after they are disconnected from the unit and place on plastic sheet. If you continued to have difficulty, call the TENS unit company to purchase more electrodes. Do not apply lotion on the skin area prior to use. Make sure the skin is clean and dry as this will help prolong the life of the electrodes. After use, always check skin for unusual red areas, rash or other skin difficulties. If there are any skin problems, does not apply electrodes to the same area. Never remove the electrodes from the unit by pulling the wires. Do not use the TENS unit or electrodes other than as directed. Do not change electrode placement without consultating your therapist or physician. Keep 2 fingers with between each electrode.   Piriformis Stretch    Lying on back, pull right knee toward opposite shoulder. Hold __30__ seconds. Repeat __3__ times. Do __2-3__ sessions per day.  Myofacial ball release work standing and lying down ~ 4 inch plastic ball    PT illustration of lat stretch supine - travell technique  30 sec hold x 3 reps

## 2019-03-17 DIAGNOSIS — M9902 Segmental and somatic dysfunction of thoracic region: Secondary | ICD-10-CM | POA: Diagnosis not present

## 2019-03-17 DIAGNOSIS — M9904 Segmental and somatic dysfunction of sacral region: Secondary | ICD-10-CM | POA: Diagnosis not present

## 2019-03-17 DIAGNOSIS — M9901 Segmental and somatic dysfunction of cervical region: Secondary | ICD-10-CM | POA: Diagnosis not present

## 2019-03-17 DIAGNOSIS — M9903 Segmental and somatic dysfunction of lumbar region: Secondary | ICD-10-CM | POA: Diagnosis not present

## 2019-03-18 ENCOUNTER — Ambulatory Visit (INDEPENDENT_AMBULATORY_CARE_PROVIDER_SITE_OTHER): Payer: BC Managed Care – PPO | Admitting: Physical Therapy

## 2019-03-18 ENCOUNTER — Encounter: Payer: Self-pay | Admitting: Physical Therapy

## 2019-03-18 ENCOUNTER — Other Ambulatory Visit: Payer: Self-pay

## 2019-03-18 DIAGNOSIS — M5440 Lumbago with sciatica, unspecified side: Secondary | ICD-10-CM | POA: Diagnosis not present

## 2019-03-18 DIAGNOSIS — M6281 Muscle weakness (generalized): Secondary | ICD-10-CM

## 2019-03-18 DIAGNOSIS — R29818 Other symptoms and signs involving the nervous system: Secondary | ICD-10-CM

## 2019-03-18 NOTE — Patient Instructions (Addendum)
Sleeping on Back  Place pillow under knees. A pillow with cervical support and a roll around waist are also helpful. Copyright  VHI. All rights reserved.  Sleeping on Side Place pillow between knees. Use cervical support under neck and a roll around waist as needed. Copyright  VHI. All rights reserved.   Sleeping on Stomach   If this is the only desirable sleeping position, place pillow under lower legs, and under stomach or chest as needed.  Posture - Sitting   Sit upright, head facing forward. Try using a roll to support lower back. Keep shoulders relaxed, and avoid rounded back. Keep hips level with knees. Avoid crossing legs for long periods. Stand to Sit / Sit to Stand   To sit: Bend knees to lower self onto front edge of chair, then scoot back on seat. To stand: Reverse sequence by placing one foot forward, and scoot to front of seat. Use rocking motion to stand up.   Work Height and Reach  Ideal work height is no more than 2 to 4 inches below elbow level when standing, and at elbow level when sitting. Reaching should be limited to arm's length, with elbows slightly bent.  Bending  Bend at hips and knees, not back. Keep feet shoulder-width apart.    Posture - Standing   Good posture is important. Avoid slouching and forward head thrust. Maintain curve in low back and align ears over shoul- ders, hips over ankles.  Alternating Positions   Alternate tasks and change positions frequently to reduce fatigue and muscle tension. Take rest breaks. Computer Work   Position work to face forward. Use proper work and seat height. Keep shoulders back and down, wrists straight, and elbows at right angles. Use chair that provides full back support. Add footrest and lumbar roll as needed.  Getting Into / Out of Car  Lower self onto seat, scoot back, then bring in one leg at a time. Reverse sequence to get out.  Dressing  Lie on back to pull socks or slacks over feet, or sit  and bend leg while keeping back straight.    Housework - Sink  Place one foot on ledge of cabinet under sink when standing at sink for prolonged periods.   Pushing / Pulling  Pushing is preferable to pulling. Keep back in proper alignment, and use leg muscles to do the work.  Deep Squat   Squat and lift with both arms held against upper trunk. Tighten stomach muscles without holding breath. Use smooth movements to avoid jerking.  Avoid Twisting   Avoid twisting or bending back. Pivot around using foot movements, and bend at knees if needed when reaching for articles.  Carrying Luggage   Distribute weight evenly on both sides. Use a cart whenever possible. Do not twist trunk. Move body as a unit.   Lifting Principles .Maintain proper posture and head alignment. .Slide object as close as possible before lifting. .Move obstacles out of the way. .Test before lifting; ask for help if too heavy. .Tighten stomach muscles without holding breath. .Use smooth movements; do not jerk. .Use legs to do the work, and pivot with feet. .Distribute the work load symmetrically and close to the center of trunk. .Push instead of pull whenever possible.   Ask For Help   Ask for help and delegate to others when possible. Coordinate your movements when lifting together, and maintain the low back curve.  Log Roll   Lying on back, bend left knee and place left   arm across chest. Roll all in one movement to the right. Reverse to roll to the left. Always move as one unit. Housework - Sweeping  Use long-handled equipment to avoid stooping.   Housework - Wiping  Position yourself as close as possible to reach work surface. Avoid straining your back.  Laundry - Unloading Wash   To unload small items at bottom of washer, lift leg opposite to arm being used to reach.  [place] close to area to be raked. Use arm movements to do the work. Keep back straight and avoid  twisting.     Cart  When reaching into cart with one arm, lift opposite leg to keep back straight.   Getting Into / Out of Bed  Lower self to lie down on one side by raising legs and lowering head at the same time. Use arms to assist moving without twisting. Bend both knees to roll onto back if desired. To sit up, start from lying on side, and use same move-ments in reverse. Housework - Vacuuming  Hold the vacuum with arm held at side. Step back and forth to move it, keeping head up. Avoid twisting.   Laundry - IT consultant so that bending and twisting can be avoided.   Laundry - Unloading Dryer  Squat down to reach into clothes dryer or use a reacher.  Gardening - Weeding / Probation officer or Kneel. Knee pads may be helpful.                   Trigger Point Dry Needling  . What is Trigger Point Dry Needling (DN)? o DN is a physical therapy technique used to treat muscle pain and dysfunction. Specifically, DN helps deactivate muscle trigger points (muscle knots).  o A thin filiform needle is used to penetrate the skin and stimulate the underlying trigger point. The goal is for a local twitch response (LTR) to occur and for the trigger point to relax. No medication of any kind is injected during the procedure.   . What Does Trigger Point Dry Needling Feel Like?  o The procedure feels different for each individual patient. Some patients report that they do not actually feel the needle enter the skin and overall the process is not painful. Very mild bleeding may occur. However, many patients feel a deep cramping in the muscle in which the needle was inserted. This is the local twitch response.   Marland Kitchen How Will I feel after the treatment? o Soreness is normal, and the onset of soreness may not occur for a few hours. Typically this soreness does not last longer than two days.  o Bruising is uncommon, however; ice can be used to decrease any  possible bruising.  o In rare cases feeling tired or nauseous after the treatment is normal. In addition, your symptoms may get worse before they get better, this period will typically not last longer than 24 hours.   . What Can I do After My Treatment? o Increase your hydration by drinking more water for the next 24 hours. o You may place ice or heat on the areas treated that have become sore, however, do not use heat on inflamed or bruised areas. Heat often brings more relief post needling. o You can continue your regular activities, but vigorous activity is not recommended initially after the treatment for 24 hours. o DN is best combined with other physical therapy such as strengthening, stretching, and other  therapies.

## 2019-03-18 NOTE — Therapy (Signed)
[facility] [place], [place], [address] Phone: [phone]   Fax:  [phone]  Physical Therapy Treatment  Patient Details  Name: Carly [name] MRN: [id number] Date of Birth: July 08, 1970 Referring Provider (PT): Silverio Decamp, MD   Encounter Date: 03/18/2019  PT End of Session - 03/18/19 0756    Visit Number  3    Number of Visits  12    Date for PT Re-Evaluation  04/24/19    Authorization Type  [employer]    PT Start Time  0718    PT Stop Time  0810    PT Time Calculation (min)  52 min    Activity Tolerance  Patient tolerated treatment well       Past Medical History:  Diagnosis Date  . Arthritis   . Hypertension   . PCOS (polycystic ovarian syndrome)   . Thyroid disease   . TIA (transient ischemic attack)     Past Surgical History:  Procedure Laterality Date  . 2 ectopic pregnancies  [id number]  . lymph nodes    . melanoma removal     RT SIDE  . removal of fallopian tube  1999    There were no vitals filed for this visit.  Subjective Assessment - 03/18/19 0721    Subjective  Not sleeping well at night.  She report had a good bit of relief for a few hours after treatment, "almost non-existant".    Patient Stated Goals  Decrease or eliminate pain.    Currently in Pain?  Yes    Pain Score  7     Pain Location  Buttocks    Pain Orientation  Right    Pain Descriptors / Indicators  Constant;Nagging    Pain Onset  More than a month ago    Aggravating Factors   sit to stand    Pain Relieving Factors  changing positions         [facility] PT Assessment - 03/18/19 0001      Assessment   Medical Diagnosis  lumbar spondylosis L5-S1    Referring Provider (PT)  Silverio Decamp, MD    Onset Date/Surgical Date  --   08/2018   Hand Dominance  Right    Prior Therapy  none      Palpation   Palpation comment  Rt ASIS lower than Lt; pt point tender with palpation to iliacus       [facility]  Adult PT Treatment/Exercise - 03/18/19 0001      Self-Care   Self-Care  Posture;Other Self-Care Comments    Posture  back care education initiated.     Other Self-Care Comments   pt instructed in sit to/from supine via log roll with TA engaged. pt returned demo with cues.   Pt educated re: ball massage to iliopsoas in prone; pt returned demo with cues - limited tolerance in this position (increased buttock pain)      Lumbar Exercises: Stretches   Passive Hamstring Stretch  Right;30 seconds;3 reps;Left;2 reps    Hip Flexor Stretch Limitations  pt instructed in stretch, but not performed this visit; will trial next visit.     Piriformis Stretch  Right;3 reps;60 seconds;Left;2 reps;20 seconds      Lumbar Exercises: Aerobic   Tread Mill  1.5 mph x 5 min       Lumbar Exercises: Seated   Sit to Stand Limitations  2 reps with TA engaged with cues.,  Lumbar Exercises: Supine   Ab Set  5 reps;5 seconds    AB Set Limitations  tactile cues for initiation;  biofeedback air bladder for improved understanding of neutral spine with TA contraction      Modalities   Modalities  Moist Heat;Electrical Stimulation      Moist Heat Therapy   Number Minutes Moist Heat  10 Minutes    Moist Heat Location  --   glutes/LB     Electrical Stimulation   Electrical Stimulation Location  Rt glute    Electrical Stimulation Action  IFC    Electrical Stimulation Parameters  10 min, intensity to tolerance    Electrical Stimulation Goals  Pain      Manual Therapy   Manual therapy comments  skilled palpation and monitoring of soft tissues during DN     Soft tissue mobilization  TPR to Rt iliacus, QL, glute min, med, piriformis.        Trigger Point Dry Needling - 03/18/19 0001    Consent Given?  Yes    Education Handout Provided  Yes    Dry Needling Comments  right    Gluteus Minimus Response  Twitch response elicited;Palpable increased muscle length    Gluteus Medius Response  Twitch response  elicited;Palpable increased muscle length    Piriformis Response  Twitch response elicited;Palpable increased muscle length    Quadratus Lumborum Response  --   no twitch response noted.                PT Long Term Goals - 03/13/19 1147      PT LONG TERM GOAL #1   Title  The patient will be indep with initial HEP.    Time  6    Period  Weeks    Target Date  04/24/19      PT LONG TERM GOAL #2   Title  The patient will reduce disability per FOTO from 40% limitation to < or equal to 30% limitation.    Baseline  60% FOTO score    Time  6    Period  Weeks    Target Date  04/24/19      PT LONG TERM GOAL #3   Title  The patient will tolerate lumbar flexion to reach the floor without R sacral pain.    Time  6    Period  Weeks    Target Date  04/24/19      PT LONG TERM GOAL #4   Title  The patient will  be able to sit in a chair without R gluteal pain and without compensatory L leaning.    Time  6    Period  Weeks    Target Date  04/24/19      PT LONG TERM GOAL #5   Title  The patient will report pain at worst < or equal to 4/10.    Baseline  8/10 at worst    Time  6    Period  Weeks    Target Date  04/24/19            Plan - 03/18/19 0900    Clinical Impression Statement  Good response to manual therapy last session; near elimination of pain for a few hours.  Pt very guarded with transitional movements.  Pt's Rt inominate appears to be ant rotated; Rt iliopsoas very tender with palpation.  Limited tolerance for ball release in psoas in prone; pt reported increased pain into Rt buttock when she takes  a deep breath during ball release.  DN performed by supervising PT, Madelyn Flavors.  Positive twitch in glute and piriformis; pt reported 2 point reduction of pain at end of session.  Goals are ongoing at this time.    Rehab Potential  Good    PT Frequency  2x / week    PT Duration  6 weeks    PT Treatment/Interventions  ADLs/Self Care Home Management;Gait  training;Stair training;Functional mobility training;Therapeutic activities;Therapeutic exercise;Neuromuscular re-education;Electrical Stimulation;Traction;Moist Heat;Iontophoresis 4mg /ml Dexamethasone;Cryotherapy;Dry needling;Taping;Manual techniques;Patient/family education    PT Next Visit Plan  continue back education; instruct in hip flexor stretch.  STM/DN to R lower quadrant.    PT Home Exercise Plan  [id number]    Consulted and Agree with Plan of Care  Patient       Patient will benefit from skilled therapeutic intervention in order to improve the following deficits and impairments:     Visit Diagnosis: Acute right-sided low back pain with sciatica, sciatica laterality unspecified  Muscle weakness (generalized)  Other symptoms and signs involving the nervous system     Problem List Patient Active Problem List   Diagnosis Date Noted  . COVID-19 10/27/2018  . B12 deficiency 02/27/2018  . Lymphadenopathy 12/29/2017  . History of melanoma 12/29/2017  . Skin sore 12/29/2017  . Radiculitis of right cervical region 12/03/2017  . Stress fracture of metatarsal bone of right foot 05/24/2017  . Asthma, mild persistent 10/04/2015  . Abnormal weight gain 01/20/2015  . SOB (shortness of breath) 05/31/2014  . Other fatigue 05/31/2014  . Tachycardia 05/31/2014  . Recurrent UTI 05/06/2014  . DUB (dysfunctional uterine bleeding) 05/06/2014  . Myofascial pain 08/03/2013  . Lumbar spondylosis 04/02/2013  . Migraine 02/13/2012  . Major depression, recurrent ([place]) 12/14/2011  . Hypothyroid 11/01/2010  . PCOS (polycystic ovarian syndrome) 11/01/2010  . Chronic right SI joint pain 11/01/2010  . Polyarthritis with left third PIP osteoarthritis 11/01/2010  . Melanoma ([place]) 11/01/2010  . Essential hypertension, benign 11/01/2010  . ANEMIA, IRON DEFICIENCY 10/17/2010  . INSOMNIA, CHRONIC 10/17/2010  . FATIGUE 10/17/2010  . EDEMA 10/17/2010   Kerin Perna, PTA 03/18/19 10:06  AM  [facility] [place], [place], [address] Phone: [phone]   Fax:  [phone]  Name: Lennan Wesely MRN: [id number] Date of Birth: Jun 06, 1970  Madelyn Flavors, PT 03/18/19 10:06 AM  [facility] at [facility] [place], [place] [address]  [phone] (office) [phone] (fax)

## 2019-03-19 DIAGNOSIS — M9901 Segmental and somatic dysfunction of cervical region: Secondary | ICD-10-CM | POA: Diagnosis not present

## 2019-03-19 DIAGNOSIS — M9903 Segmental and somatic dysfunction of lumbar region: Secondary | ICD-10-CM | POA: Diagnosis not present

## 2019-03-19 DIAGNOSIS — M9902 Segmental and somatic dysfunction of thoracic region: Secondary | ICD-10-CM | POA: Diagnosis not present

## 2019-03-19 DIAGNOSIS — M9904 Segmental and somatic dysfunction of sacral region: Secondary | ICD-10-CM | POA: Diagnosis not present

## 2019-03-23 ENCOUNTER — Encounter: Payer: Self-pay | Admitting: Family Medicine

## 2019-03-23 ENCOUNTER — Ambulatory Visit (INDEPENDENT_AMBULATORY_CARE_PROVIDER_SITE_OTHER): Payer: BC Managed Care – PPO | Admitting: Rehabilitative and Restorative Service Providers"

## 2019-03-23 ENCOUNTER — Other Ambulatory Visit: Payer: Self-pay

## 2019-03-23 ENCOUNTER — Encounter: Payer: Self-pay | Admitting: Rehabilitative and Restorative Service Providers"

## 2019-03-23 ENCOUNTER — Telehealth (INDEPENDENT_AMBULATORY_CARE_PROVIDER_SITE_OTHER): Payer: BC Managed Care – PPO | Admitting: Family Medicine

## 2019-03-23 DIAGNOSIS — G43809 Other migraine, not intractable, without status migrainosus: Secondary | ICD-10-CM

## 2019-03-23 DIAGNOSIS — R29818 Other symptoms and signs involving the nervous system: Secondary | ICD-10-CM | POA: Diagnosis not present

## 2019-03-23 DIAGNOSIS — E039 Hypothyroidism, unspecified: Secondary | ICD-10-CM

## 2019-03-23 DIAGNOSIS — G4700 Insomnia, unspecified: Secondary | ICD-10-CM

## 2019-03-23 DIAGNOSIS — I10 Essential (primary) hypertension: Secondary | ICD-10-CM

## 2019-03-23 DIAGNOSIS — M5440 Lumbago with sciatica, unspecified side: Secondary | ICD-10-CM | POA: Diagnosis not present

## 2019-03-23 DIAGNOSIS — E282 Polycystic ovarian syndrome: Secondary | ICD-10-CM

## 2019-03-23 DIAGNOSIS — M6281 Muscle weakness (generalized): Secondary | ICD-10-CM | POA: Diagnosis not present

## 2019-03-23 MED ORDER — LOSARTAN POTASSIUM 25 MG PO TABS: 25.0000 mg | ORAL_TABLET | Freq: Every day | ORAL | 0 refills | Status: DC

## 2019-03-23 MED ORDER — HYDROCHLOROTHIAZIDE 12.5 MG PO TABS: ORAL_TABLET | ORAL | 0 refills | Status: DC

## 2019-03-23 NOTE — Assessment & Plan Note (Signed)
Did discuss that some insurances to limit the quantity on the Ambien because initially it was indicated for as needed use.  Did encourage her to try taking a half a tab sometimes instead of a whole tab to see if it is just as effective as the 5 mg dose is what is currently FDA approved for women.  She does feel like the mindfulness before sleep has been helpful and she continues to work on that.

## 2019-03-23 NOTE — Assessment & Plan Note (Signed)
Currently on metformin and spironolactone.  Due to check A1c.  We have not done this in quite a while.

## 2019-03-23 NOTE — Progress Notes (Signed)
Established Patient Office Visit  Subjective:  Patient ID: Carly [name], female    DOB: 11-19-1970  Age: 49 y.o. MRN: [id number]  CC:  Chief Complaint  Patient presents with  . Insomnia    HPI Carly [name] presents for   Hypertension- Pt denies chest pain, SOB, dizziness, or heart palpitations.  Taking meds as directed w/o problems.  Denies medication side effects.  Ports her headaches have actually been better.  Though she has not recently checked her blood pressure.  Hypothyroidism - Taking medication regularly in the AM away from food and vitamins, etc. No recent change to skin, hair, or energy levels.  She currently takes 137 mcg on Tuesdays and Thursdays and 125 mcg the other 5 days a week.  F/U PCOS - she is doing well on metformin.   Insomnia-has been having a hard time getting the full 30 tabs for her chronic insomnia at her [place].  This last time she actually use good Rx and just paid for it.  Has scheduled her mammogram for March 10.  Past Medical History:  Diagnosis Date  . Arthritis   . Hypertension   . PCOS (polycystic ovarian syndrome)   . Thyroid disease   . TIA (transient ischemic attack)     Past Surgical History:  Procedure Laterality Date  . 2 ectopic pregnancies  [id number]  . lymph nodes    . melanoma removal     RT SIDE  . removal of fallopian tube  1999    Family History  Problem Relation Age of Onset  . Melanoma Mother   . Depression Mother   . Hyperlipidemia Mother   . Colon cancer Maternal Grandmother   . Colon cancer Father 28       deceased.     Social History   Socioeconomic History  . Marital status: Married    Spouse name: Herbie Baltimore  . Number of children: 3  . Years of education: Not on file  . Highest education level: Not on file  Occupational History  . 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   Tobacco Use  . Smoking status: Never Smoker  . Smokeless  tobacco: Never Used  Substance and Sexual Activity  . Alcohol use: No  . Drug use: No  . Sexual activity: Yes    Partners: Male    Comment: adopted  Other Topics Concern  . Not on file  Social History Narrative   Caffeine daily. No regular exercise.  She was adopted.    Social Determinants of Health   Financial Resource Strain:   . Difficulty of Paying Living Expenses: Not on file  Food Insecurity:   . Worried About Charity fundraiser in the Last Year: Not on file  . Ran Out of Food in the Last Year: Not on file  Transportation Needs:   . Lack of Transportation (Medical): Not on file  . Lack of Transportation (Non-Medical): Not on file  Physical Activity:   . Days of Exercise per Week: Not on file  . Minutes of Exercise per Session: Not on file  Stress:   . Feeling of Stress : Not on file  Social Connections:   . Frequency of Communication with Friends and Family: Not on file  . Frequency of Social Gatherings with Friends and Family: Not on file  . Attends Religious Services: Not on file  . Active Member of Clubs or Organizations: Not on  file  . Attends Archivist Meetings: Not on file  . Marital Status: Not on file  Intimate Partner Violence:   . Fear of Current or Ex-Partner: Not on file  . Emotionally Abused: Not on file  . Physically Abused: Not on file  . Sexually Abused: Not on file    Outpatient Medications Prior to Visit  Medication Sig Dispense Refill  . Calcium Carbonate-Vitamin D 600-400 MG-UNIT tablet Take 1 tablet by mouth 2 (two) times daily. 60 tablet 11  . cyanocobalamin (,VITAMIN B-12,) 1000 MCG/ML injection Inject 1 mL (1,000 mcg total) into the muscle every 21 ( twenty-one) days. 10 mL 0  . fluticasone (FLONASE) 50 MCG/ACT nasal spray Place 2 sprays into the nose daily as needed. For seasonal allergy nasal congestion 16 g 1  . gabapentin (NEURONTIN) 800 MG tablet TAKE 1 TABLET BY MOUTH 2 TO 3 TIMES DAILY 90 tablet 0  . levonorgestrel  (MIRENA) 20 MCG/24HR IUD 1 Intra Uterine Device (1 each total) by Intrauterine route once. Inserted 10/19/14 1 each 0  . metFORMIN (GLUCOPHAGE) 1000 MG tablet TAKE 1 TABLET BY MOUTH TWICE DAILY WITH MEALS 180 tablet 0  . omeprazole (PRILOSEC) 40 MG capsule Take 1 capsule by mouth twice daily 90 capsule 0  . spironolactone (ALDACTONE) 25 MG tablet Take 1 tablet by mouth once daily 90 tablet 0  . SYNTHROID 125 MCG tablet TAKE 1 TABLET BY MOUTH ONCE DAILY BEFORE BREAKFAST 90 tablet 0  . SYNTHROID 137 MCG tablet TAKE 1 TABLET BY MOUTH ONCE DAILY BEFORE BREAKFAST 90 tablet 0  . SYRINGE-NEEDLE, DISP, 3 ML 23G X 1" 3 ML MISC by Does not apply route.    . traMADol (ULTRAM) 50 MG tablet Take 1-2 tablets (50-100 mg total) by mouth every 8 (eight) hours as needed for moderate pain. Maximum 6 tabs per day. 21 tablet 0  . zolpidem (AMBIEN) 10 MG tablet Take 1 tablet (10 mg total) by mouth at bedtime. 30 tablet 0  . hydrochlorothiazide (HYDRODIURIL) 12.5 MG tablet Take 1/2 (one-half) tablet by mouth once daily 45 tablet 0  . losartan (COZAAR) 25 MG tablet Take 1 tablet by mouth once daily 90 tablet 0  . albuterol (PROVENTIL HFA;VENTOLIN HFA) 108 (90 Base) MCG/ACT inhaler Inhale 2 puffs into the lungs every 4 (four) hours as needed for wheezing or shortness of breath (bronchospasm). 1 Inhaler 0  . amoxicillin-clavulanate (AUGMENTIN) 875-125 MG tablet Take 1 tablet by mouth 2 (two) times daily. 14 tablet 0   No facility-administered medications prior to visit.    Allergies  Allergen Reactions  . Cymbalta [Duloxetine Hcl] Other (See Comments)    Jolting/jerking sensations.   . Sulfonamide Derivatives Hives  . Tape Rash    Use paper tape    ROS Review of Systems    Objective:    Physical Exam  Constitutional: She is oriented to person, place, and time. She appears well-developed and well-nourished.  HENT:  Head: Normocephalic and atraumatic.  Eyes: Conjunctivae and EOM are normal.  Pulmonary/Chest:  Effort normal.  Neurological: She is alert and oriented to person, place, and time.  Skin: Skin is dry. No pallor.  Psychiatric: She has a normal mood and affect. Her behavior is normal.  Vitals reviewed.   There were no vitals taken for this visit. Wt Readings from Last 3 Encounters:  12/02/18 144 lb (65.3 kg)  10/27/18 138 lb (62.6 kg)  08/27/18 142 lb (64.4 kg)     Health Maintenance Due  Topic  Date Due  . HIV Screening  05/05/1984  . MAMMOGRAM  02/22/2017  . TETANUS/TDAP  01/23/2019  . PAP SMEAR-Modifier  02/21/2019    There are no preventive care reminders to display for this patient.  Lab Results  Component Value Date   TSH 0.09 (L) 07/10/2018   Lab Results  Component Value Date   WBC 6.1 12/27/2017   HGB 11.8 12/27/2017   HCT 35.1 12/27/2017   MCV 91.4 12/27/2017   PLT 349 12/27/2017   Lab Results  Component Value Date   NA 138 07/10/2018   K 4.0 07/10/2018   CO2 28 07/10/2018   GLUCOSE 84 07/10/2018   BUN 11 07/10/2018   CREATININE 1.01 07/10/2018   BILITOT 0.4 07/10/2018   ALKPHOS 36 09/11/2016   AST 13 07/10/2018   ALT 11 07/10/2018   PROT 6.9 07/10/2018   ALBUMIN 4.2 09/11/2016   CALCIUM 9.5 07/10/2018   Lab Results  Component Value Date   CHOL 180 07/10/2018   Lab Results  Component Value Date   HDL 46 (L) 07/10/2018   Lab Results  Component Value Date   LDLCALC 116 (H) 07/10/2018   Lab Results  Component Value Date   TRIG 84 07/10/2018   Lab Results  Component Value Date   CHOLHDL 3.9 07/10/2018   No results found for: HGBA1C    Assessment & Plan:   Problem List Items Addressed This Visit      Cardiovascular and Mediastinum   Migraine    Overall her headaches have been much less frequent.      Relevant Medications   losartan (COZAAR) 25 MG tablet   hydrochlorothiazide (HYDRODIURIL) 12.5 MG tablet   Essential hypertension, benign - Primary    Did encourage her to check her blood pressure at home a couple times over  the next 2 weeks just to make sure that they are well controlled.  She was unable to get an updated blood pressure before her visit today.  Continue current regimen. Follow up in  15m o, due for labs.      Relevant Medications   losartan (COZAAR) 25 MG tablet   hydrochlorothiazide (HYDRODIURIL) 12.5 MG tablet   Other Relevant Orders   BASIC METABOLIC PANEL WITH GFR     Endocrine   PCOS (polycystic ovarian syndrome)    Currently on metformin and spironolactone.  Due to check A1c.  We have not done this in quite a while.      Relevant Orders   BASIC METABOLIC PANEL WITH GFR   Hemoglobin A1c   Hypothyroid    See current regimen above.  Due to recheck TSH.  Will make any adjustments needed.  She feels like everything is well controlled right now.      Relevant Orders   BASIC METABOLIC PANEL WITH GFR   TSH     Other   INSOMNIA, CHRONIC    Did discuss that some insurances to limit the quantity on the Ambien because initially it was indicated for as needed use.  Did encourage her to try taking a half a tab sometimes instead of a whole tab to see if it is just as effective as the 5 mg dose is what is currently FDA approved for women.  She does feel like the mindfulness before sleep has been helpful and she continues to work on that.         Meds ordered this encounter  Medications  . losartan (COZAAR) 25 MG tablet  Sig: Take 1 tablet (25 mg total) by mouth daily.    Dispense:  90 tablet    Refill:  0  . hydrochlorothiazide (HYDRODIURIL) 12.5 MG tablet    Sig: Take 1/2 (one-half) tablet by mouth once daily    Dispense:  45 tablet    Refill:  0    Follow-up: No follow-ups on file.   Time spent in encounter 30 minutes including reviewing notes and labs.  Beatrice Lecher, MD

## 2019-03-23 NOTE — Assessment & Plan Note (Signed)
Overall her headaches have been much less frequent.

## 2019-03-23 NOTE — Assessment & Plan Note (Addendum)
Did encourage her to check her blood pressure at home a couple times over the next 2 weeks just to make sure that they are well controlled.  She was unable to get an updated blood pressure before her visit today.  Continue current regimen. Follow up in  83m o, due for labs.

## 2019-03-23 NOTE — Therapy (Signed)
[facility] [place], [place], [address] Phone: [phone]   Fax:  [phone]  Physical Therapy Treatment  Patient Details  Name: Carly [name] MRN: [id number] Date of Birth: April 06, 1970 Referring Provider (PT): Silverio Decamp, MD   Encounter Date: 03/23/2019  PT End of Session - 03/23/19 0719    Visit Number  4    Number of Visits  12    Date for PT Re-Evaluation  04/24/19    Authorization Type  BCBS    PT Start Time  0716    PT Stop Time  0801    PT Time Calculation (min)  45 min    Activity Tolerance  Patient tolerated treatment well       Past Medical History:  Diagnosis Date  . Arthritis   . Hypertension   . PCOS (polycystic ovarian syndrome)   . Thyroid disease   . TIA (transient ischemic attack)     Past Surgical History:  Procedure Laterality Date  . 2 ectopic pregnancies  [id number]  . lymph nodes    . melanoma removal     RT SIDE  . removal of fallopian tube  1999    There were no vitals filed for this visit.  Subjective Assessment - 03/23/19 0719    Subjective  Some relief following treatment - especially with deep tissue work. Patient reports that she still has pain and discomfort on a daily basis. No significant changes overall since beginning therapy.    Currently in Pain?  Yes    Pain Score  7     Pain Location  Buttocks    Pain Orientation  Right    Pain Descriptors / Indicators  Constant;Nagging    Pain Type  Acute pain    Pain Radiating Towards  Rt posterior hip    Pain Onset  More than a month ago    Pain Frequency  Constant    Aggravating Factors   sit to stand    Pain Relieving Factors  changing positions; deep tissue work                       [facility] Adult PT Treatment/Exercise - 03/23/19 0001      Lumbar Exercises: Stretches   Hip Flexor Stretch  Right;Left;2 reps;30 seconds    Other Lumbar Stretch Exercise  assisted hip adductor  stretch 20 sec x 3 reps       Lumbar Exercises: Aerobic   Tread Mill  1.5 mph x 5 min       Moist Heat Therapy   Number Minutes Moist Heat  10 Minutes    Moist Heat Location  Hip   anterior Rt hip/abdomen     Manual Therapy   Manual therapy comments  skilled palpation and monitoring of soft tissues during DN     Soft tissue mobilization  deep tissue work Rt psoas/anterior hip/hip flexors     Myofascial Release  anterior hip        Trigger Point Dry Needling - 03/23/19 0001    Consent Given?  Yes    Education Handout Provided  Previously provided    Dry Needling Comments  right    Adductor Response  Palpable increased muscle length    Rectus femoris Response  Palpable increased muscle length           PT Education - 03/23/19 0802    Education Details  HEP    Person(s) Educated  Patient    Methods  Explanation;Demonstration;Tactile cues;Verbal cues;Handout    Comprehension  Verbalized understanding;Returned demonstration;Verbal cues required;Tactile cues required          PT Long Term Goals - 03/13/19 1147      PT LONG TERM GOAL #1   Title  The patient will be indep with initial HEP.    Time  6    Period  Weeks    Target Date  04/24/19      PT LONG TERM GOAL #2   Title  The patient will reduce disability per FOTO from 40% limitation to < or equal to 30% limitation.    Baseline  60% FOTO score    Time  6    Period  Weeks    Target Date  04/24/19      PT LONG TERM GOAL #3   Title  The patient will tolerate lumbar flexion to reach the floor without R sacral pain.    Time  6    Period  Weeks    Target Date  04/24/19      PT LONG TERM GOAL #4   Title  The patient will  be able to sit in a chair without R gluteal pain and without compensatory L leaning.    Time  6    Period  Weeks    Target Date  04/24/19      PT LONG TERM GOAL #5   Title  The patient will report pain at worst < or equal to 4/10.    Baseline  8/10 at worst    Time  6    Period  Weeks     Target Date  04/24/19            Plan - 03/23/19 0759    Clinical Impression Statement  Good response to manual work anterior Rt hip- psoas/hip flexors/adductors with DN to hip flexors and adductors. Discussed avoiding crossing legs - which pt states that she does "all the time" Rt LE over Lt.    Rehab Potential  Good    PT Frequency  2x / week    PT Duration  6 weeks    PT Treatment/Interventions  ADLs/Self Care Home Management;Gait training;Stair training;Functional mobility training;Therapeutic activities;Therapeutic exercise;Neuromuscular re-education;Electrical Stimulation;Traction;Moist Heat;Iontophoresis 4mg /ml Dexamethasone;Cryotherapy;Dry needling;Taping;Manual techniques;Patient/family education    PT Next Visit Plan  continue back education; instruct in hip flexor stretch.  STM/DN to R lower quadrant.    PT Home Exercise Plan  [id number]    Consulted and Agree with Plan of Care  Patient       Patient will benefit from skilled therapeutic intervention in order to improve the following deficits and impairments:     Visit Diagnosis: Acute right-sided low back pain with sciatica, sciatica laterality unspecified  Muscle weakness (generalized)  Other symptoms and signs involving the nervous system     Problem List Patient Active Problem List   Diagnosis Date Noted  . COVID-19 10/27/2018  . B12 deficiency 02/27/2018  . Lymphadenopathy 12/29/2017  . History of melanoma 12/29/2017  . Skin sore 12/29/2017  . Radiculitis of right cervical region 12/03/2017  . Stress fracture of metatarsal bone of right foot 05/24/2017  . Asthma, mild persistent 10/04/2015  . Abnormal weight gain 01/20/2015  . SOB (shortness of breath) 05/31/2014  . Other fatigue 05/31/2014  . Tachycardia 05/31/2014  . Recurrent UTI 05/06/2014  . DUB (dysfunctional uterine bleeding) 05/06/2014  . Myofascial pain 08/03/2013  . Lumbar spondylosis 04/02/2013  . Migraine  02/13/2012  . Major  depression, recurrent ([place]) 12/14/2011  . Hypothyroid 11/01/2010  . PCOS (polycystic ovarian syndrome) 11/01/2010  . Chronic right SI joint pain 11/01/2010  . Polyarthritis with left third PIP osteoarthritis 11/01/2010  . Melanoma ([place]) 11/01/2010  . Essential hypertension, benign 11/01/2010  . ANEMIA, IRON DEFICIENCY 10/17/2010  . INSOMNIA, CHRONIC 10/17/2010  . FATIGUE 10/17/2010  . EDEMA 10/17/2010    Krisna Omar Nilda Simmer PT, MPH 03/23/2019, 8:03 AM  [facility] [place], [place], [address] Phone: [phone]   Fax:  [phone]  Name: Carly [name] MRN: [id number] Date of Birth: Jun 05, 1970

## 2019-03-23 NOTE — Assessment & Plan Note (Signed)
See current regimen above.  Due to recheck TSH.  Will make any adjustments needed.  She feels like everything is well controlled right now.

## 2019-03-23 NOTE — Progress Notes (Signed)
Pt reports that [facility] will only dispense 25 of her Ambien tablets unsure why she's not able to get the full 30 days worth.  She stated that her husband gets his sleep medication from [facility] and would like to get the [place] sent there.   She takes Levothyroxine 137 mcg on Tuesday and Friday and the 125 mcg all other days.

## 2019-03-24 DIAGNOSIS — M47816 Spondylosis without myelopathy or radiculopathy, lumbar region: Secondary | ICD-10-CM

## 2019-03-24 MED ORDER — TRAMADOL HCL 50 MG PO TABS: 50.0000 mg | ORAL_TABLET | Freq: Three times a day (TID) | ORAL | 1 refills | Status: DC | PRN

## 2019-03-25 ENCOUNTER — Ambulatory Visit (INDEPENDENT_AMBULATORY_CARE_PROVIDER_SITE_OTHER): Payer: BC Managed Care – PPO | Admitting: Physical Therapy

## 2019-03-25 ENCOUNTER — Other Ambulatory Visit: Payer: Self-pay

## 2019-03-25 ENCOUNTER — Encounter: Payer: Self-pay | Admitting: Physical Therapy

## 2019-03-25 DIAGNOSIS — M6281 Muscle weakness (generalized): Secondary | ICD-10-CM

## 2019-03-25 DIAGNOSIS — M5440 Lumbago with sciatica, unspecified side: Secondary | ICD-10-CM

## 2019-03-25 DIAGNOSIS — R29818 Other symptoms and signs involving the nervous system: Secondary | ICD-10-CM

## 2019-03-25 NOTE — Therapy (Signed)
[facility] [place], [place], [address] Phone: [phone]   Fax:  [phone]  Physical Therapy Treatment  Patient Details  Name: Carly [name] MRN: [id number] Date of Birth: [birth year] Referring Provider (PT): Silverio Decamp, MD   Encounter Date: 03/25/2019  PT End of Session - 03/25/19 0717    Visit Number  5    Number of Visits  12    Date for PT Re-Evaluation  04/24/19    Authorization Type  BCBS    PT Start Time  0717    PT Stop Time  0804    PT Time Calculation (min)  47 min    Activity Tolerance  Patient tolerated treatment well;Patient limited by pain    Behavior During Therapy  [facility] for tasks assessed/performed       Past Medical History:  Diagnosis Date  . Arthritis   . Hypertension   . PCOS (polycystic ovarian syndrome)   . Thyroid disease   . TIA (transient ischemic attack)     Past Surgical History:  Procedure Laterality Date  . 2 ectopic pregnancies  [id number]  . lymph nodes    . melanoma removal     RT SIDE  . removal of fallopian tube  1999    There were no vitals filed for this visit.  Subjective Assessment - 03/25/19 0717    Subjective  She had temporary relief with DN, but no "magical" fix.  she does her stretches at least once a day.    Patient Stated Goals  Decrease or eliminate pain.    Currently in Pain?  Yes    Pain Score  6     Pain Location  Buttocks    Pain Orientation  Right    Pain Descriptors / Indicators  Aching;Dull;Nagging    Pain Radiating Towards  down Rt posterior LE to knee    Pain Onset  More than a month ago    Aggravating Factors   first thing in the morning, prolonged walking    Pain Relieving Factors  changing positions, deep tissue work         [facility] PT Assessment - 03/25/19 0001      Assessment   Medical Diagnosis  lumbar spondylosis L5-S1    Referring Provider (PT)  Silverio Decamp, MD    Onset Date/Surgical Date  --    08/2018   Hand Dominance  Right    Prior Therapy  none      [facility] Adult PT Treatment/Exercise - 03/25/19 0001      Lumbar Exercises: Stretches   Passive Hamstring Stretch  Right;30 seconds;3 reps    Single Knee to Chest Stretch  Right;Left;1 rep;10 seconds    Single Knee to Chest Stretch Limitations  pt demo this exercise (issued by MD)    Lower Trunk Rotation Limitations  pt demo this exercise; issued by MD, limited tolerance.     Hip Flexor Stretch Limitations  attempted seated and Thomas position for RLE; unable to tolerate due to increased pain in RLE.     Piriformis Stretch  Right;2 reps;Left;1 rep;30 seconds    Figure 4 Stretch  1 rep;20 seconds      Lumbar Exercises: Aerobic   Tread Mill  1.5-1.8 mph x 5.5 min      Lumbar Exercises: Supine   Bridge Limitations  2 reps; limited tolerance due to increased radicular symptoms.       Moist Heat Therapy   Number Minutes Moist  Heat  10 Minutes    Moist Heat Location  Hip   ant/ post Rt hip/abdomen     Electrical Stimulation   Electrical Stimulation Location  Rt glute/ hamstring    Electrical Stimulation Action  IFC    Electrical Stimulation Parameters  10 min, intensity to tolerance    Electrical Stimulation Goals  Pain      Manual Therapy   Manual Therapy  Soft tissue mobilization;Myofascial release;Muscle Energy Technique    Manual therapy comments  limited tolerance for prone position.     Soft tissue mobilization  deep tissue work Rt iliopsoas,  Rt glute, piriformis, Rt hamstring (pressure to deep rotators with active ER/IR while pt in prone position- limited tolerance)  TPR to Rt QL - no tender areas found.     Myofascial Release  Rt iliopsoas     Muscle Energy Technique  MET correction for ant rotated Rt innominate using hamstring contraction, then passive hip flexion        PT Long Term Goals - 03/13/19 1147      PT LONG TERM GOAL #1   Title  The patient will be indep with initial HEP.    Time  6    Period   Weeks    Target Date  04/24/19      PT LONG TERM GOAL #2   Title  The patient will reduce disability per FOTO from 40% limitation to < or equal to 30% limitation.    Baseline  60% FOTO score    Time  6    Period  Weeks    Target Date  04/24/19      PT LONG TERM GOAL #3   Title  The patient will tolerate lumbar flexion to reach the floor without R sacral pain.    Time  6    Period  Weeks    Target Date  04/24/19      PT LONG TERM GOAL #4   Title  The patient will  be able to sit in a chair without R gluteal pain and without compensatory L leaning.    Time  6    Period  Weeks    Target Date  04/24/19      PT LONG TERM GOAL #5   Title  The patient will report pain at worst < or equal to 4/10.    Baseline  8/10 at worst    Time  6    Period  Weeks    Target Date  04/24/19            Plan - 03/25/19 1006    Clinical Impression Statement  Positive response to DN, however short lived relief.  Pt very sensitive to palpation and MFR to Rt glute, iliopsoas.  Limited tolerance to exercise and manual work.  Pt reported greatest relief of radicular symptoms with supported supine position (legs on white bolster) with MHP; pain returns when pt returns to standing. Goals are ongoing.    Rehab Potential  Good    PT Frequency  2x / week    PT Duration  6 weeks    PT Treatment/Interventions  ADLs/Self Care Home Management;Gait training;Stair training;Functional mobility training;Therapeutic activities;Therapeutic exercise;Neuromuscular re-education;Electrical Stimulation;Traction;Moist Heat;Iontophoresis 45m/ml Dexamethasone;Cryotherapy;Dry needling;Taping;Manual techniques;Patient/family education    PT Next Visit Plan  continue back education.  STM/DN to R lower quadrant.    PT Home Exercise Plan  [id number]   Consulted and Agree with Plan of Care  Patient  Patient will benefit from skilled therapeutic intervention in order to improve the following deficits and impairments:      Visit Diagnosis: Acute right-sided low back pain with sciatica, sciatica laterality unspecified  Muscle weakness (generalized)  Other symptoms and signs involving the nervous system     Problem List Patient Active Problem List   Diagnosis Date Noted  . B12 deficiency 02/27/2018  . Lymphadenopathy 12/29/2017  . History of melanoma 12/29/2017  . Radiculitis of right cervical region 12/03/2017  . Stress fracture of metatarsal bone of right foot 05/24/2017  . Asthma, mild persistent 10/04/2015  . Abnormal weight gain 01/20/2015  . Tachycardia 05/31/2014  . Recurrent UTI 05/06/2014  . DUB (dysfunctional uterine bleeding) 05/06/2014  . Lumbar spondylosis 04/02/2013  . Migraine 02/13/2012  . Major depression, recurrent ([place]) 12/14/2011  . Hypothyroid 11/01/2010  . PCOS (polycystic ovarian syndrome) 11/01/2010  . Chronic right SI joint pain 11/01/2010  . Polyarthritis with left third PIP osteoarthritis 11/01/2010  . Melanoma ([place]) 11/01/2010  . Essential hypertension, benign 11/01/2010  . ANEMIA, IRON DEFICIENCY 10/17/2010  . INSOMNIA, CHRONIC 10/17/2010  . FATIGUE 10/17/2010  . EDEMA 10/17/2010   Kerin Perna, PTA 03/25/19 10:19 AM  [facility] [place], [place], [address] Phone: [phone]   Fax:  [phone]  Name: Neveen Daponte MRN: [id number] Date of Birth: [birth year]

## 2019-03-30 ENCOUNTER — Encounter: Payer: Self-pay | Admitting: Rehabilitative and Restorative Service Providers"

## 2019-03-30 ENCOUNTER — Ambulatory Visit (INDEPENDENT_AMBULATORY_CARE_PROVIDER_SITE_OTHER): Payer: BC Managed Care – PPO | Admitting: Rehabilitative and Restorative Service Providers"

## 2019-03-30 ENCOUNTER — Other Ambulatory Visit: Payer: Self-pay

## 2019-03-30 DIAGNOSIS — M6281 Muscle weakness (generalized): Secondary | ICD-10-CM | POA: Diagnosis not present

## 2019-03-30 DIAGNOSIS — M5440 Lumbago with sciatica, unspecified side: Secondary | ICD-10-CM | POA: Diagnosis not present

## 2019-03-30 DIAGNOSIS — R29818 Other symptoms and signs involving the nervous system: Secondary | ICD-10-CM | POA: Diagnosis not present

## 2019-03-30 NOTE — Therapy (Signed)
[place], [place], [address] Phone: [phone]   Fax:  [phone]  Physical Therapy Treatment  Patient Details  Name: Carly [name] MRN: [id number] Date of Birth: 09-16-1970 Referring Provider (PT): Silverio Decamp, MD   Encounter Date: 03/30/2019  PT End of Session - 03/30/19 0714    Visit Number  6    Number of Visits  12    Date for PT Re-Evaluation  04/24/19    Authorization Type  BCBS    PT Start Time  0713    PT Stop Time  0807    PT Time Calculation (min)  54 min       Past Medical History:  Diagnosis Date  . Arthritis   . Hypertension   . PCOS (polycystic ovarian syndrome)   . Thyroid disease   . TIA (transient ischemic attack)     Past Surgical History:  Procedure Laterality Date  . 2 ectopic pregnancies  [id number]  . lymph nodes    . melanoma removal     RT SIDE  . removal of fallopian tube  1999    There were no vitals filed for this visit.  Subjective Assessment - 03/30/19 0715    Subjective  May be getting some better. Still the worst when she moves from sit to stand. Working on her exercises at home.    Currently in Pain?  Yes    Pain Score  6     Pain Location  Buttocks    Pain Orientation  Right    Pain Descriptors / Indicators  Aching;Dull;Nagging    Pain Type  Acute pain                       [facility] Adult PT Treatment/Exercise - 03/30/19 0001      Lumbar Exercises: Aerobic   Tread Mill  1.5-1.8 mph x 5 min      Lumbar Exercises: Standing   Other Standing Lumbar Exercises  hip extension standing x 10 x 2 sets     Other Standing Lumbar Exercises  hip abduction leading with heel 10 reps x 2 sets       Moist Heat Therapy   Number Minutes Moist Heat  12 Minutes    Moist Heat Location  Hip;Lumbar Spine   ant/ post Rt hip/abdomen     Electrical Stimulation   Electrical Stimulation Location  Rt adductors/proximal hip  flexors/gluts/piriformis     Electrical Stimulation Action  IFC    Electrical Stimulation Parameters  to tolerance    Electrical Stimulation Goals  Pain;Tone      Manual Therapy   Manual Therapy  Soft tissue mobilization;Myofascial release;Muscle Energy Technique    Manual therapy comments  skilled palpation and monitering of tissue for DN    Soft tissue mobilization  deep tissue work Rt iliopsoas,  Rt glute, piriformis, Rt hamstring (pressure to deep rotators with active ER/IR while pt in prone position- limited tolerance)  TPR to Rt QL - no tender areas found.     Myofascial Release  Rt anterior and medial hip        Trigger Point Dry Needling - 03/30/19 0001    Consent Given?  Yes    Education Handout Provided  Previously provided    Dry Needling Comments  right    Adductor Response  Palpable increased muscle length    Rectus femoris Response  Palpable increased muscle length    Gluteus  Minimus Response  Palpable increased muscle length    Gluteus Medius Response  Palpable increased muscle length    Piriformis Response  Palpable increased muscle length                PT Long Term Goals - 03/13/19 1147      PT LONG TERM GOAL #1   Title  The patient will be indep with initial HEP.    Time  6    Period  Weeks    Target Date  04/24/19      PT LONG TERM GOAL #2   Title  The patient will reduce disability per FOTO from 40% limitation to < or equal to 30% limitation.    Baseline  60% FOTO score    Time  6    Period  Weeks    Target Date  04/24/19      PT LONG TERM GOAL #3   Title  The patient will tolerate lumbar flexion to reach the floor without R sacral pain.    Time  6    Period  Weeks    Target Date  04/24/19      PT LONG TERM GOAL #4   Title  The patient will  be able to sit in a chair without R gluteal pain and without compensatory L leaning.    Time  6    Period  Weeks    Target Date  04/24/19      PT LONG TERM GOAL #5   Title  The patient will report  pain at worst < or equal to 4/10.    Baseline  8/10 at worst    Time  6    Period  Weeks    Target Date  04/24/19            Plan - 03/30/19 0801    Clinical Impression Statement  Persistent pain with sit to stand. Working no exercises at home but has not found a ball for myofacial ball release. Good response to DN and manual work. Added active hip extensin and abduction in standing. Gradual progress toward stated goals of therapy.    Rehab Potential  Good    PT Frequency  2x / week    PT Duration  6 weeks    PT Treatment/Interventions  ADLs/Self Care Home Management;Gait training;Stair training;Functional mobility training;Therapeutic activities;Therapeutic exercise;Neuromuscular re-education;Electrical Stimulation;Traction;Moist Heat;Iontophoresis 4mg /ml Dexamethasone;Cryotherapy;Dry needling;Taping;Manual techniques;Patient/family education    PT Next Visit Plan  continue back education.  STM/DN to R lower quadrant.    PT Home Exercise Plan  [id number]    Consulted and Agree with Plan of Care  Patient       Patient will benefit from skilled therapeutic intervention in order to improve the following deficits and impairments:  Hypomobility, Pain, Postural dysfunction, Impaired sensation, Impaired flexibility, Decreased strength, Decreased range of motion, Decreased activity tolerance  Visit Diagnosis: Acute right-sided low back pain with sciatica, sciatica laterality unspecified  Muscle weakness (generalized)  Other symptoms and signs involving the nervous system     Problem List Patient Active Problem List   Diagnosis Date Noted  . B12 deficiency 02/27/2018  . Lymphadenopathy 12/29/2017  . History of melanoma 12/29/2017  . Radiculitis of right cervical region 12/03/2017  . Stress fracture of metatarsal bone of right foot 05/24/2017  . Asthma, mild persistent 10/04/2015  . Abnormal weight gain 01/20/2015  . Tachycardia 05/31/2014  . Recurrent UTI 05/06/2014  . DUB  (dysfunctional uterine bleeding) 05/06/2014  .  Lumbar spondylosis 04/02/2013  . Migraine 02/13/2012  . Major depression, recurrent ([place]) 12/14/2011  . Hypothyroid 11/01/2010  . PCOS (polycystic ovarian syndrome) 11/01/2010  . Chronic right SI joint pain 11/01/2010  . Polyarthritis with left third PIP osteoarthritis 11/01/2010  . Melanoma ([place]) 11/01/2010  . Essential hypertension, benign 11/01/2010  . ANEMIA, IRON DEFICIENCY 10/17/2010  . INSOMNIA, CHRONIC 10/17/2010  . FATIGUE 10/17/2010  . EDEMA 10/17/2010    Sheriff Rodenberg Nilda Simmer PT, MPH  03/30/2019, 8:03 AM  [facility] [place], [place], [address] Phone: [phone]   Fax:  [phone]  Name: Carly [name] MRN: [id number] Date of Birth: Jul 16, 1970

## 2019-04-01 ENCOUNTER — Ambulatory Visit (INDEPENDENT_AMBULATORY_CARE_PROVIDER_SITE_OTHER): Payer: BC Managed Care – PPO | Admitting: Rehabilitative and Restorative Service Providers"

## 2019-04-01 ENCOUNTER — Other Ambulatory Visit: Payer: Self-pay

## 2019-04-01 ENCOUNTER — Encounter: Payer: Self-pay | Admitting: Rehabilitative and Restorative Service Providers"

## 2019-04-01 DIAGNOSIS — M5440 Lumbago with sciatica, unspecified side: Secondary | ICD-10-CM | POA: Diagnosis not present

## 2019-04-01 DIAGNOSIS — M6281 Muscle weakness (generalized): Secondary | ICD-10-CM

## 2019-04-01 DIAGNOSIS — R29818 Other symptoms and signs involving the nervous system: Secondary | ICD-10-CM | POA: Diagnosis not present

## 2019-04-01 NOTE — Therapy (Addendum)
[place], [place], [address] Phone: [phone]   Fax:  [phone]  Physical Therapy Treatment  Patient Details  Name: Carly [name] MRN: [id number] Date of Birth: [birth year] Referring Provider (PT): Silverio Decamp, MD   Encounter Date: 04/01/2019  PT End of Session - 04/01/19 1035    Visit Number  7    Number of Visits  12    Date for PT Re-Evaluation  04/24/19    Authorization Type  BCBS    PT Start Time  0725    PT Stop Time  0810    PT Time Calculation (min)  45 min       Past Medical History:  Diagnosis Date  . Arthritis   . Hypertension   . PCOS (polycystic ovarian syndrome)   . Thyroid disease   . TIA (transient ischemic attack)     Past Surgical History:  Procedure Laterality Date  . 2 ectopic pregnancies  [id number]  . lymph nodes    . melanoma removal     RT SIDE  . removal of fallopian tube  1999    There were no vitals filed for this visit.  Subjective Assessment - 04/01/19 0727    Subjective  The patient reports significant improvement in R gluteal pain and hamstring since dry needling.  She notes she feels like pain is 3-4/10 (compared to typical pain of 7/10).    Pertinent History  PCOS, HTN, allergies    Patient Stated Goals  Decrease or eliminate pain.    Currently in Pain?  Yes    Pain Score  4     Pain Location  Buttocks    Pain Orientation  Right    Pain Descriptors / Indicators  Aching;Dull;Nagging    Pain Type  Acute pain    Pain Frequency  Constant    Aggravating Factors   after sitting    Pain Relieving Factors  changing positions         [facility] PT Assessment - 04/01/19 0001      Assessment   Medical Diagnosis  lumbar spondylosis L5-S1    Referring Provider (PT)  Silverio Decamp, MD    Prior Therapy  none                   [facility] Adult PT Treatment/Exercise - 04/01/19 0733      Ambulation/Gait   Ambulation/Gait   Yes    Gait Comments  walked in between stretches to assess response and observe mechanics; although pain lower today, she maintains mild knee flexion mid stance of gait, and has antalgic pattern R leg stance      Exercises   Exercises  Lumbar      Lumbar Exercises: Stretches   Passive Hamstring Stretch  Right;30 seconds;3 reps    Passive Hamstring Stretch Limitations  contract/relax    Single Knee to Chest Stretch  Right    Single Knee to Chest Stretch Limitations  passively moved into hip flexion/ single knee to chest     Hip Flexor Stretch  Right    Hip Flexor Stretch Limitations  prone quad stretch initiated, but leads to hamstring spasm    Other Lumbar Stretch Exercise  hip adductor stretch supine PROM      Lumbar Exercises: Standing   Other Standing Lumbar Exercises  Standing with R foot in chair rotating to the right to open up through R SI region (near wall for support)  Lumbar Exercises: Prone   Straight Leg Raises Limitations  gluteal contraction prone x 3 reps, L hip extension x 3 reps with pain R hamstrings,     Other Prone Lumbar Exercises  Prone knee flexion + hip flexion *unable to lift R leg against gravity, resisted knee flexion x 3 reps.      Traction   Type of Traction  Lumbar    Min (lbs)  40    Max (lbs)  50    Hold Time  60    Rest Time  20    Time  15 minutes      Manual Therapy   Manual Therapy  Joint mobilization;Neural Stretch    Joint Mobilization  Prone with hips IR performed sacral PA mobilization    Neural Stretch  R hamstring stretch with ankle DF/PF for neural glide                  PT Long Term Goals - 03/13/19 1147      PT LONG TERM GOAL #1   Title  The patient will be indep with initial HEP.    Time  6    Period  Weeks    Target Date  04/24/19      PT LONG TERM GOAL #2   Title  The patient will reduce disability per FOTO from 40% limitation to < or equal to 30% limitation.    Baseline  60% FOTO score    Time  6     Period  Weeks    Target Date  04/24/19      PT LONG TERM GOAL #3   Title  The patient will tolerate lumbar flexion to reach the floor without R sacral pain.    Time  6    Period  Weeks    Target Date  04/24/19      PT LONG TERM GOAL #4   Title  The patient will  be able to sit in a chair without R gluteal pain and without compensatory L leaning.    Time  6    Period  Weeks    Target Date  04/24/19      PT LONG TERM GOAL #5   Title  The patient will report pain at worst < or equal to 4/10.    Baseline  8/10 at worst    Time  6    Period  Weeks    Target Date  04/24/19            Plan - 04/01/19 1101    Clinical Impression Statement  The patient noted significant improvement with sit>stand since dry needling at last visit.  She is sleeping better recently getting a new bed.  She has hamstring spasm today with quad stretch, pain with hamstring stretch.  She tolerated traction and will continue with dry needling next visit.    Rehab Potential  Good    PT Frequency  2x / week    PT Duration  6 weeks    PT Treatment/Interventions  ADLs/Self Care Home Management;Gait training;Stair training;Functional mobility training;Therapeutic activities;Therapeutic exercise;Neuromuscular re-education;Electrical Stimulation;Traction;Moist Heat;Iontophoresis 4mg /ml Dexamethasone;Cryotherapy;Dry needling;Taping;Manual techniques;Patient/family education    PT Next Visit Plan  ask about symptoms after traction, continue back education.  STM/DN to R lower quadrant.    PT Home Exercise Plan  [id number]    Consulted and Agree with Plan of Care  Patient       Patient will benefit from skilled therapeutic intervention in order to  improve the following deficits and impairments:  Hypomobility, Pain, Postural dysfunction, Impaired sensation, Impaired flexibility, Decreased strength, Decreased range of motion, Decreased activity tolerance  Visit Diagnosis: Acute right-sided low back pain with sciatica,  sciatica laterality unspecified  Muscle weakness (generalized)  Other symptoms and signs involving the nervous system     Problem List Patient Active Problem List   Diagnosis Date Noted  . B12 deficiency 02/27/2018  . Lymphadenopathy 12/29/2017  . History of melanoma 12/29/2017  . Radiculitis of right cervical region 12/03/2017  . Stress fracture of metatarsal bone of right foot 05/24/2017  . Asthma, mild persistent 10/04/2015  . Abnormal weight gain 01/20/2015  . Tachycardia 05/31/2014  . Recurrent UTI 05/06/2014  . DUB (dysfunctional uterine bleeding) 05/06/2014  . Lumbar spondylosis 04/02/2013  . Migraine 02/13/2012  . Major depression, recurrent ([place]) 12/14/2011  . Hypothyroid 11/01/2010  . PCOS (polycystic ovarian syndrome) 11/01/2010  . Chronic right SI joint pain 11/01/2010  . Polyarthritis with left third PIP osteoarthritis 11/01/2010  . Melanoma ([place]) 11/01/2010  . Essential hypertension, benign 11/01/2010  . ANEMIA, IRON DEFICIENCY 10/17/2010  . INSOMNIA, CHRONIC 10/17/2010  . FATIGUE 10/17/2010  . EDEMA 10/17/2010    [place], PT 04/01/2019, 11:56 AM  [facility] [place], [place], [address] Phone: [phone]   Fax:  [phone]  Name: Tabita Thrower MRN: [id number] Date of Birth: January 08, 1971

## 2019-04-05 ENCOUNTER — Other Ambulatory Visit: Payer: Self-pay | Admitting: Sports Medicine

## 2019-04-05 DIAGNOSIS — M47816 Spondylosis without myelopathy or radiculopathy, lumbar region: Secondary | ICD-10-CM

## 2019-04-06 ENCOUNTER — Other Ambulatory Visit: Payer: Self-pay

## 2019-04-06 ENCOUNTER — Ambulatory Visit (INDEPENDENT_AMBULATORY_CARE_PROVIDER_SITE_OTHER): Payer: BC Managed Care – PPO | Admitting: Rehabilitative and Restorative Service Providers"

## 2019-04-06 ENCOUNTER — Encounter: Payer: Self-pay | Admitting: Rehabilitative and Restorative Service Providers"

## 2019-04-06 DIAGNOSIS — M6281 Muscle weakness (generalized): Secondary | ICD-10-CM | POA: Diagnosis not present

## 2019-04-06 DIAGNOSIS — R29818 Other symptoms and signs involving the nervous system: Secondary | ICD-10-CM | POA: Diagnosis not present

## 2019-04-06 DIAGNOSIS — M5440 Lumbago with sciatica, unspecified side: Secondary | ICD-10-CM | POA: Diagnosis not present

## 2019-04-06 NOTE — Therapy (Signed)
[place], [place], [address] Phone: [phone]   Fax:  [phone]  Physical Therapy Treatment  Patient Details  Name: Carly [name] MRN: [id number] Date of Birth: [birth year] Referring Provider (PT): Silverio Decamp, MD   Encounter Date: 04/06/2019  PT End of Session - 04/06/19 0719    Visit Number  8    Number of Visits  12    Date for PT Re-Evaluation  04/24/19    PT Start Time  0715    PT Stop Time  0802    PT Time Calculation (min)  47 min    Activity Tolerance  Patient tolerated treatment well       Past Medical History:  Diagnosis Date  . Arthritis   . Hypertension   . PCOS (polycystic ovarian syndrome)   . Thyroid disease   . TIA (transient ischemic attack)     Past Surgical History:  Procedure Laterality Date  . 2 ectopic pregnancies  [id number]  . lymph nodes    . melanoma removal     RT SIDE  . removal of fallopian tube  1999    There were no vitals filed for this visit.  Subjective Assessment - 04/06/19 0719    Subjective  Continued gradyual improvement - still has some sharp shooting pain when moving sit to stand at times.    Currently in Pain?  Yes    Pain Score  3     Pain Location  Buttocks    Pain Orientation  Right    Pain Descriptors / Indicators  Aching;Dull;Nagging    Pain Type  Acute pain    Pain Radiating Towards  Rt posterior hip    Pain Onset  More than a month ago    Pain Frequency  Constant    Aggravating Factors   after sitting; sit to stand    Pain Relieving Factors  changing positions                       [facility] Adult PT Treatment/Exercise - 04/06/19 0001      Lumbar Exercises: Stretches   Passive Hamstring Stretch  Right;30 seconds;3 reps    Passive Hamstring Stretch Limitations  supine with strap     Hip Flexor Stretch  Right;Left;3 reps;30 seconds   half kneeling - knee on coam cushion      Lumbar Exercises:  Aerobic   Tread Mill  0.8 mph x 6 min walking backwards       Moist Heat Therapy   Number Minutes Moist Heat  10 Minutes    Moist Heat Location  Lumbar Spine;Hip   posterior thigh      Electrical Stimulation   Electrical Stimulation Location  Rt posterior hip/hamstrings     Electrical Stimulation Action  IFC    Electrical Stimulation Parameters  to tolerance    Electrical Stimulation Goals  Pain;Tone      Manual Therapy   Manual therapy comments  skilled palpation and monitering of tissue for DN    Joint Mobilization  prone PA mobs Rt hip     Soft tissue mobilization  deep tissue work posterior Rt hip to hamstrings     Myofascial Release  Rt posterior hip        Trigger Point Dry Needling - 04/06/19 0001    Consent Given?  Yes    Education Handout Provided  Previously provided    Dry Needling Comments  right  Hamstring Response  Palpable increased muscle length    Gluteus Minimus Response  Palpable increased muscle length    Gluteus Medius Response  Palpable increased muscle length    Piriformis Response  Palpable increased muscle length           PT Education - 04/06/19 0732    Education Details  HEP    Person(s) Educated  Patient    Methods  Explanation;Demonstration;Tactile cues;Verbal cues;Handout    Comprehension  Verbalized understanding;Returned demonstration;Verbal cues required;Tactile cues required          PT Long Term Goals - 03/13/19 1147      PT LONG TERM GOAL #1   Title  The patient will be indep with initial HEP.    Time  6    Period  Weeks    Target Date  04/24/19      PT LONG TERM GOAL #2   Title  The patient will reduce disability per FOTO from 40% limitation to < or equal to 30% limitation.    Baseline  60% FOTO score    Time  6    Period  Weeks    Target Date  04/24/19      PT LONG TERM GOAL #3   Title  The patient will tolerate lumbar flexion to reach the floor without R sacral pain.    Time  6    Period  Weeks    Target Date   04/24/19      PT LONG TERM GOAL #4   Title  The patient will  be able to sit in a chair without R gluteal pain and without compensatory L leaning.    Time  6    Period  Weeks    Target Date  04/24/19      PT LONG TERM GOAL #5   Title  The patient will report pain at worst < or equal to 4/10.    Baseline  8/10 at worst    Time  6    Period  Weeks    Target Date  04/24/19            Plan - 04/06/19 0727    Clinical Impression Statement  Gradual porgress with patient having less intense pain. She continues to have pain with sit to stand and soem "nagging" pain in the posterior hip all the time. She has myofacial balls and TENS unit for home use. Patient added hip flexor stretch in half kneeing. She has palpable tightness through the anterior/posterior hip into the hamstrings. Progressing well with rehab.    Rehab Potential  Good    PT Frequency  2x / week    PT Duration  6 weeks    PT Treatment/Interventions  ADLs/Self Care Home Management;Gait training;Stair training;Functional mobility training;Therapeutic activities;Therapeutic exercise;Neuromuscular re-education;Electrical Stimulation;Traction;Moist Heat;Iontophoresis 4mg /ml Dexamethasone;Cryotherapy;Dry needling;Taping;Manual techniques;Patient/family education    PT Next Visit Plan  continue back education.  STM/DN to R lower quadrant.    PT Home Exercise Plan  [id number]    Consulted and Agree with Plan of Care  Patient       Patient will benefit from skilled therapeutic intervention in order to improve the following deficits and impairments:     Visit Diagnosis: Acute right-sided low back pain with sciatica, sciatica laterality unspecified  Muscle weakness (generalized)  Other symptoms and signs involving the nervous system     Problem List Patient Active Problem List   Diagnosis Date Noted  . B12 deficiency 02/27/2018  . Lymphadenopathy  12/29/2017  . History of melanoma 12/29/2017  . Radiculitis of right  cervical region 12/03/2017  . Stress fracture of metatarsal bone of right foot 05/24/2017  . Asthma, mild persistent 10/04/2015  . Abnormal weight gain 01/20/2015  . Tachycardia 05/31/2014  . Recurrent UTI 05/06/2014  . DUB (dysfunctional uterine bleeding) 05/06/2014  . Lumbar spondylosis 04/02/2013  . Migraine 02/13/2012  . Major depression, recurrent ([place]) 12/14/2011  . Hypothyroid 11/01/2010  . PCOS (polycystic ovarian syndrome) 11/01/2010  . Chronic right SI joint pain 11/01/2010  . Polyarthritis with left third PIP osteoarthritis 11/01/2010  . Melanoma ([place]) 11/01/2010  . Essential hypertension, benign 11/01/2010  . ANEMIA, IRON DEFICIENCY 10/17/2010  . INSOMNIA, CHRONIC 10/17/2010  . FATIGUE 10/17/2010  . EDEMA 10/17/2010    Vaneta [place]ontree Nilda Simmer PT, MPH  04/06/2019, 7:59 AM  [facility] [place], [place], [address] Phone: [phone]   Fax:  [phone]  Name: Carly [name] MRN: [id number] Date of Birth: 1970-06-29

## 2019-04-06 NOTE — Patient Instructions (Signed)
Access Code: [email or web address] URL: [email or web address] Date: 04/06/2019 Prepared by: Gillermo Murdoch  Exercises Hooklying Hamstring Stretch with Strap - 2 x daily - 7 x weekly - 3 reps - 1 sets - 30 seconds hold Supine Piriformis Stretch - 2 x daily - 7 x weekly - 3 reps - 1 sets - 30 seconds hold Half Kneeling Hip Flexor Stretch - 2 x daily - 7 x weekly - 3 reps - 1 sets - 30 sec hold

## 2019-04-08 ENCOUNTER — Encounter: Payer: Self-pay | Admitting: Physical Therapy

## 2019-04-08 ENCOUNTER — Other Ambulatory Visit: Payer: Self-pay

## 2019-04-08 ENCOUNTER — Ambulatory Visit (INDEPENDENT_AMBULATORY_CARE_PROVIDER_SITE_OTHER): Payer: BC Managed Care – PPO | Admitting: Physical Therapy

## 2019-04-08 DIAGNOSIS — R29818 Other symptoms and signs involving the nervous system: Secondary | ICD-10-CM | POA: Diagnosis not present

## 2019-04-08 DIAGNOSIS — M6281 Muscle weakness (generalized): Secondary | ICD-10-CM

## 2019-04-08 DIAGNOSIS — M5440 Lumbago with sciatica, unspecified side: Secondary | ICD-10-CM | POA: Diagnosis not present

## 2019-04-08 NOTE — Therapy (Signed)
[place], [place], [address] Phone: [phone]   Fax:  [phone]  Physical Therapy Treatment  Patient Details  Name: Carly [name] MRN: [id number] Date of Birth: 1970-03-27 Referring Provider (PT): Silverio Decamp, MD   Encounter Date: 04/08/2019  PT End of Session - 04/08/19 0725    Visit Number  9    Number of Visits  12    Date for PT Re-Evaluation  04/24/19    Authorization Type  BCBS    PT Start Time  0718    PT Stop Time  0812    PT Time Calculation (min)  54 min    Activity Tolerance  Patient tolerated treatment well       Past Medical History:  Diagnosis Date  . Arthritis   . Hypertension   . PCOS (polycystic ovarian syndrome)   . Thyroid disease   . TIA (transient ischemic attack)     Past Surgical History:  Procedure Laterality Date  . 2 ectopic pregnancies  [id number]  . lymph nodes    . melanoma removal     RT SIDE  . removal of fallopian tube  1999    There were no vitals filed for this visit.  Subjective Assessment - 04/08/19 0726    Subjective  Pt reports her hamstring is not "as tight as a drum" anymore, but still has continued pain in Rt buttock.  Continues to have pain with transition from sit to stand.    Currently in Pain?  Yes    Pain Score  4     Pain Location  Buttocks    Pain Orientation  Right    Pain Descriptors / Indicators  Aching;Dull;Nagging    Pain Radiating Towards  Rt posterior hip    Pain Onset  More than a month ago    Aggravating Factors   sleeping, after sitting    Pain Relieving Factors  changing positions         [facility] PT Assessment - 04/08/19 0001      Assessment   Medical Diagnosis  lumbar spondylosis L5-S1    Referring Provider (PT)  Silverio Decamp, MD    Prior Therapy  none       [facility] Adult PT Treatment/Exercise - 04/08/19 0001      Lumbar Exercises: Stretches   Passive Hamstring Stretch  Right;2  reps;30 seconds    Passive Hamstring Stretch Limitations  supine with strap, knee slightly bent     Hip Flexor Stretch  Right;3 reps;30 seconds   high kneeling   Other Lumbar Stretch Exercise  assisted lat stretch (PTA to hold knees to chest), pt reaching arms overhead with deep breathes x 20 sec x 3 reps      Lumbar Exercises: Aerobic   Tread Mill  0.8 mph x 3 min walking backwards       Knee/Hip Exercises: Standing   Hip Abduction  Stengthening;Right;Left;1 set;10 reps    Hip Extension  Stengthening;Right;1 set;10 reps      Moist Heat Therapy   Number Minutes Moist Heat  12 Minutes   pt in Lt sidelying   Moist Heat Location  Lumbar Spine;Hip   posterior thigh      Electrical Stimulation   Electrical Stimulation Location  Rt posterior hip/hamstrings     Electrical Stimulation Action  premod to LE/ hip    Electrical Stimulation Parameters  intensity to tolerance, 12 min     Electrical Stimulation Goals  Pain      Manual Therapy   Soft tissue mobilization  TPR to Rt piriformis and obturator internus with contract relax     Myofascial Release  Rt iliopsoas with deep breath work                   PT Long Term Goals - 03/13/19 1147      PT LONG TERM GOAL #1   Title  The patient will be indep with initial HEP.    Time  6    Period  Weeks    Target Date  04/24/19      PT LONG TERM GOAL #2   Title  The patient will reduce disability per FOTO from 40% limitation to < or equal to 30% limitation.    Baseline  60% FOTO score    Time  6    Period  Weeks    Target Date  04/24/19      PT LONG TERM GOAL #3   Title  The patient will tolerate lumbar flexion to reach the floor without R sacral pain.    Time  6    Period  Weeks    Target Date  04/24/19      PT LONG TERM GOAL #4   Title  The patient will  be able to sit in a chair without R gluteal pain and without compensatory L leaning.    Time  6    Period  Weeks    Target Date  04/24/19      PT LONG TERM GOAL #5    Title  The patient will report pain at worst < or equal to 4/10.    Baseline  8/10 at worst    Time  6    Period  Weeks    Target Date  04/24/19            Plan - 04/08/19 1319    Clinical Impression Statement  Pt reporting improved ability to complete some of the stretches in HEP.  Pt was very point tender in Rt iliopsoas and obturator internus; improved with TPR to area.  Pt had some increase in pain with manual work; reduced with use of estim and MHP at end of session.  Progressing towards LTGs.    Rehab Potential  Good    PT Frequency  2x / week    PT Duration  6 weeks    PT Treatment/Interventions  ADLs/Self Care Home Management;Gait training;Stair training;Functional mobility training;Therapeutic activities;Therapeutic exercise;Neuromuscular re-education;Electrical Stimulation;Traction;Moist Heat;Iontophoresis 4mg /ml Dexamethasone;Cryotherapy;Dry needling;Taping;Manual techniques;Patient/family education    PT Next Visit Plan  continue back education.  STM/DN to R lower quadrant.    PT Home Exercise Plan  [id number]    Consulted and Agree with Plan of Care  Patient       Patient will benefit from skilled therapeutic intervention in order to improve the following deficits and impairments:  Hypomobility, Pain, Postural dysfunction, Impaired sensation, Impaired flexibility, Decreased strength, Decreased range of motion, Decreased activity tolerance  Visit Diagnosis: Acute right-sided low back pain with sciatica, sciatica laterality unspecified  Muscle weakness (generalized)  Other symptoms and signs involving the nervous system     Problem List Patient Active Problem List   Diagnosis Date Noted  . B12 deficiency 02/27/2018  . Lymphadenopathy 12/29/2017  . History of melanoma 12/29/2017  . Radiculitis of right cervical region 12/03/2017  . Stress fracture of metatarsal bone of right foot 05/24/2017  . Asthma, mild persistent 10/04/2015  .  Abnormal weight gain  01/20/2015  . Tachycardia 05/31/2014  . Recurrent UTI 05/06/2014  . DUB (dysfunctional uterine bleeding) 05/06/2014  . Lumbar spondylosis 04/02/2013  . Migraine 02/13/2012  . Major depression, recurrent ([place]) 12/14/2011  . Hypothyroid 11/01/2010  . PCOS (polycystic ovarian syndrome) 11/01/2010  . Chronic right SI joint pain 11/01/2010  . Polyarthritis with left third PIP osteoarthritis 11/01/2010  . Melanoma ([place]) 11/01/2010  . Essential hypertension, benign 11/01/2010  . ANEMIA, IRON DEFICIENCY 10/17/2010  . INSOMNIA, CHRONIC 10/17/2010  . FATIGUE 10/17/2010  . EDEMA 10/17/2010   Kerin Perna, PTA 04/08/19 1:21 PM  [place], [place], [address] Phone: [phone]   Fax:  [phone]  Name: Carly [name] MRN: [id number] Date of Birth: Apr 28, 1970

## 2019-04-13 ENCOUNTER — Other Ambulatory Visit: Payer: Self-pay

## 2019-04-13 ENCOUNTER — Other Ambulatory Visit: Payer: Self-pay | Admitting: Family Medicine

## 2019-04-13 ENCOUNTER — Ambulatory Visit (INDEPENDENT_AMBULATORY_CARE_PROVIDER_SITE_OTHER): Payer: BC Managed Care – PPO | Admitting: Physical Therapy

## 2019-04-13 DIAGNOSIS — M47816 Spondylosis without myelopathy or radiculopathy, lumbar region: Secondary | ICD-10-CM

## 2019-04-13 DIAGNOSIS — M5412 Radiculopathy, cervical region: Secondary | ICD-10-CM

## 2019-04-13 DIAGNOSIS — M6281 Muscle weakness (generalized): Secondary | ICD-10-CM | POA: Diagnosis not present

## 2019-04-13 DIAGNOSIS — R29818 Other symptoms and signs involving the nervous system: Secondary | ICD-10-CM | POA: Diagnosis not present

## 2019-04-13 DIAGNOSIS — M5440 Lumbago with sciatica, unspecified side: Secondary | ICD-10-CM | POA: Diagnosis not present

## 2019-04-13 NOTE — Telephone Encounter (Signed)
Forwarding to provider.

## 2019-04-13 NOTE — Therapy (Signed)
[place], [place], [address] Phone: [phone]   Fax:  [phone]  Physical Therapy Treatment  Patient Details  Name: Carly [name] MRN: [id number] Date of Birth: 1970-05-20 Referring Provider (PT): Silverio Decamp, MD   Encounter Date: 04/13/2019  PT End of Session - 04/13/19 0733    Visit Number  10    Number of Visits  12    Date for PT Re-Evaluation  04/24/19    Authorization Type  BCBS    PT Start Time  0717    PT Stop Time  0804    PT Time Calculation (min)  47 min       Past Medical History:  Diagnosis Date  . Arthritis   . Hypertension   . PCOS (polycystic ovarian syndrome)   . Thyroid disease   . TIA (transient ischemic attack)     Past Surgical History:  Procedure Laterality Date  . 2 ectopic pregnancies  [id number]  . lymph nodes    . melanoma removal     RT SIDE  . removal of fallopian tube  1999    There were no vitals filed for this visit.  Subjective Assessment - 04/13/19 0803    Subjective  Pt reports she was able to ride her recumbent bike for 20 min without increase in pain.  Continues to have searing pain in buttocks with transition from sit to stand.    Currently in Pain?  Yes    Pain Score  5     Pain Location  Buttocks    Pain Descriptors / Indicators  Nagging;Dull    Pain Radiating Towards  into Rt hamstring    Pain Onset  More than a month ago    Aggravating Factors   sleeping, after sitting    Pain Relieving Factors  changing positions.         [facility] PT Assessment - 04/13/19 0001      Assessment   Medical Diagnosis  lumbar spondylosis L5-S1    Referring Provider (PT)  Silverio Decamp, MD    Prior Therapy  none       [facility] Adult PT Treatment/Exercise - 04/13/19 0001      Lumbar Exercises: Stretches   Standing Extension  5 seconds;5 reps      Lumbar Exercises: Aerobic   Tread Mill  1.0 mph x 3.5 min walking backwards        Lumbar Exercises: Standing   Wall Slides  5 reps   neutral spine, core engaged.    Other Standing Lumbar Exercises  forward step up, reciprocal pattern x 10 (wiht BUE support on rail), core engaged, mirror for feedback on RLE -to keep in neutral position (vs compensating with hip abdct, ER and valgus at knee)      Lumbar Exercises: Seated   Sit to Stand  10 reps   staggered stance, using LLE mostly, neutral spine   Other Seated Lumbar Exercises  pelvic tilts sitting on dyna disc, to find neutral x 5 reps       Electrical Stimulation   Electrical Stimulation Location  Rt posterior hip/hamstrings     Electrical Stimulation Action  premod to LE/ hip    Electrical Stimulation Parameters  intensity to tolerance     Electrical Stimulation Goals  Pain      Traction   Type of Traction  Lumbar    Min (lbs)  45    Max (lbs)  55  Hold Time  60    Rest Time  20    Time  15   20min time on machine; 15 min total            PT Education - 04/13/19 0802    Education Details  Pt educated on pressure on spine in different positions; shown picture for visual.    Person(s) Educated  Patient    Methods  Explanation    Comprehension  Verbalized understanding          PT Long Term Goals - 03/13/19 1147      PT LONG TERM GOAL #1   Title  The patient will be indep with initial HEP.    Time  6    Period  Weeks    Target Date  04/24/19      PT LONG TERM GOAL #2   Title  The patient will reduce disability per FOTO from 40% limitation to < or equal to 30% limitation.    Baseline  60% FOTO score    Time  6    Period  Weeks    Target Date  04/24/19      PT LONG TERM GOAL #3   Title  The patient will tolerate lumbar flexion to reach the floor without R sacral pain.    Time  6    Period  Weeks    Target Date  04/24/19      PT LONG TERM GOAL #4   Title  The patient will  be able to sit in a chair without R gluteal pain and without compensatory L leaning.    Time  6    Period   Weeks    Target Date  04/24/19      PT LONG TERM GOAL #5   Title  The patient will report pain at worst < or equal to 4/10.    Baseline  8/10 at worst    Time  6    Period  Weeks    Target Date  04/24/19            Plan - 04/13/19 1000    Clinical Impression Statement  Pt educated regarding spinal position in regards to spinal pressure; she was able to feel a difference in her pain level when focusing on keeping neutral spine with transitions and stairs. Pt is observed with compensatory strategies in hip and lower leg on RLE with ascending stairs; required feedback, but pt able to keep LE in more neutral position.  Pt tolerated exercises without increase in pain.  She tolerated the pull for traction, however positioning irritated her in supported hooklying; may do better in prone.  Progressing gradually towards LTGs.    Rehab Potential  Good    PT Frequency  2x / week    PT Duration  6 weeks    PT Treatment/Interventions  ADLs/Self Care Home Management;Gait training;Stair training;Functional mobility training;Therapeutic activities;Therapeutic exercise;Neuromuscular re-education;Electrical Stimulation;Traction;Moist Heat;Iontophoresis 4mg /ml Dexamethasone;Cryotherapy;Dry needling;Taping;Manual techniques;Patient/family education    PT Next Visit Plan  continue back education. spinal stabilization. STM/DN to R lower quadrant.    PT Home Exercise Plan  [id number]    Consulted and Agree with Plan of Care  Patient       Patient will benefit from skilled therapeutic intervention in order to improve the following deficits and impairments:  Hypomobility, Pain, Postural dysfunction, Impaired sensation, Impaired flexibility, Decreased strength, Decreased range of motion, Decreased activity tolerance  Visit Diagnosis: Acute right-sided low back pain with sciatica,  sciatica laterality unspecified  Muscle weakness (generalized)  Other symptoms and signs involving the nervous  system     Problem List Patient Active Problem List   Diagnosis Date Noted  . B12 deficiency 02/27/2018  . Lymphadenopathy 12/29/2017  . History of melanoma 12/29/2017  . Radiculitis of right cervical region 12/03/2017  . Stress fracture of metatarsal bone of right foot 05/24/2017  . Asthma, mild persistent 10/04/2015  . Abnormal weight gain 01/20/2015  . Tachycardia 05/31/2014  . Recurrent UTI 05/06/2014  . DUB (dysfunctional uterine bleeding) 05/06/2014  . Lumbar spondylosis 04/02/2013  . Migraine 02/13/2012  . Major depression, recurrent ([place]) 12/14/2011  . Hypothyroid 11/01/2010  . PCOS (polycystic ovarian syndrome) 11/01/2010  . Chronic right SI joint pain 11/01/2010  . Polyarthritis with left third PIP osteoarthritis 11/01/2010  . Melanoma ([place]) 11/01/2010  . Essential hypertension, benign 11/01/2010  . ANEMIA, IRON DEFICIENCY 10/17/2010  . INSOMNIA, CHRONIC 10/17/2010  . FATIGUE 10/17/2010  . EDEMA 10/17/2010   Kerin Perna, PTA 04/13/19 10:04 AM  [place], [place], [address] Phone: [phone]   Fax:  [phone]  Name: Eliona Gurevich MRN: [id number] Date of Birth: 12-01-1970

## 2019-04-13 NOTE — Telephone Encounter (Signed)
Cervical epidural ordered, please contact [facility] for scheduling.

## 2019-04-14 NOTE — Telephone Encounter (Signed)
LMOM with Roberta at [facility].

## 2019-04-15 ENCOUNTER — Ambulatory Visit (INDEPENDENT_AMBULATORY_CARE_PROVIDER_SITE_OTHER): Payer: BC Managed Care – PPO | Admitting: Rehabilitative and Restorative Service Providers"

## 2019-04-15 ENCOUNTER — Other Ambulatory Visit: Payer: Self-pay

## 2019-04-15 DIAGNOSIS — M6281 Muscle weakness (generalized): Secondary | ICD-10-CM | POA: Diagnosis not present

## 2019-04-15 DIAGNOSIS — M5440 Lumbago with sciatica, unspecified side: Secondary | ICD-10-CM | POA: Diagnosis not present

## 2019-04-15 DIAGNOSIS — R29818 Other symptoms and signs involving the nervous system: Secondary | ICD-10-CM

## 2019-04-15 NOTE — Therapy (Signed)
[place], [place], [address] Phone: [phone]   Fax:  [phone]  Physical Therapy Treatment  Patient Details  Name: Carly [name] MRN: [id number] Date of Birth: [birth year] Referring Provider (PT): Silverio Decamp, MD   Encounter Date: 04/15/2019  PT End of Session - 04/15/19 1229    Visit Number  11    Number of Visits  12    Date for PT Re-Evaluation  04/24/19    Authorization Type  BCBS    PT Start Time  0715    PT Stop Time  0810    PT Time Calculation (min)  55 min    Activity Tolerance  Patient tolerated treatment well    Behavior During Therapy  [facility] for tasks assessed/performed       Past Medical History:  Diagnosis Date  . Arthritis   . Hypertension   . PCOS (polycystic ovarian syndrome)   . Thyroid disease   . TIA (transient ischemic attack)     Past Surgical History:  Procedure Laterality Date  . 2 ectopic pregnancies  [id number]  . lymph nodes    . melanoma removal     RT SIDE  . removal of fallopian tube  1999    There were no vitals filed for this visit.  Subjective Assessment - 04/15/19 0717    Subjective  The patient reports she is getting up and down better since learning new technique.  Pain is reducing compared to her first day.  She is positioning herself better during sleep.  The patient is getting an injection in low back this Friday.    Pertinent History  PCOS, HTN, allergies    Patient Stated Goals  Decrease or eliminate pain.    Currently in Pain?  Yes    Pain Score  4     Pain Location  Buttocks    Pain Orientation  Right    Pain Descriptors / Indicators  Dull;Nagging    Pain Type  Acute pain    Pain Onset  More than a month ago    Pain Frequency  Constant    Aggravating Factors   prolonged sitting    Pain Relieving Factors  changing positions                       [facility] Adult PT Treatment/Exercise - 04/15/19 0722      Exercises   Exercises  Lumbar;Other Exercises    Other Exercises   --      Lumbar Exercises: Stretches   Passive Hamstring Stretch  Right;2 reps;30 seconds    Other Lumbar Stretch Exercise  supine distract      Lumbar Exercises: Standing   Wall Slides  10 reps    Other Standing Lumbar Exercises  Patient demonstrated forward fold reaching to the ground with pain R SI region      Lumbar Exercises: Seated   Other Seated Lumbar Exercises  Foam roller to tolerance in bilateral low back and SI region.      Lumbar Exercises: Supine   Bridge  5 reps    Bridge Limitations  able to tolerate today    Other Supine Lumbar Exercises  Performed supine self distraction of the R hip, which reduces hamstring spasm x 3 minutes (using black t-band shut in door to form a loop)      Lumbar Exercises: Sidelying   Other Sidelying Lumbar Exercises  isometric glut and hamstring contraction with  PT providing LE support      Lumbar Exercises: Prone   Other Prone Lumbar Exercises  prone quad set with minimal HS and glut firing    Other Prone Lumbar Exercises  Isometric hamstring contraction to fatigue HS and inhibit firing      Modalities   Modalities  Traction      Traction   Min (lbs)  45    Max (lbs)  55    Hold Time  60    Rest Time  20    Time  15      Manual Therapy   Manual Therapy  Passive ROM;Joint mobilization    Manual therapy comments  to reduce pain and inhibit R hamstring firing    Joint Mobilization  prone hip bilat IR with grade I-II PA sacral mobilization; supine hip distraction    Passive ROM  hip IR/ER in prone                  PT Long Term Goals - 03/13/19 1147      PT LONG TERM GOAL #1   Title  The patient will be indep with initial HEP.    Time  6    Period  Weeks    Target Date  04/24/19      PT LONG TERM GOAL #2   Title  The patient will reduce disability per FOTO from 40% limitation to < or equal to 30% limitation.    Baseline  60% FOTO score    Time  6     Period  Weeks    Target Date  04/24/19      PT LONG TERM GOAL #3   Title  The patient will tolerate lumbar flexion to reach the floor without R sacral pain.    Time  6    Period  Weeks    Target Date  04/24/19      PT LONG TERM GOAL #4   Title  The patient will  be able to sit in a chair without R gluteal pain and without compensatory L leaning.    Time  6    Period  Weeks    Target Date  04/24/19      PT LONG TERM GOAL #5   Title  The patient will report pain at worst < or equal to 4/10.    Baseline  8/10 at worst    Time  6    Period  Weeks    Target Date  04/24/19            Plan - 04/15/19 1234    Clinical Impression Statement  The patient is scheduled for an injection on Friday.  She notes some improvement with therapy, however continues with times of shotting pain in R glut region.  She is carrying over compenstaory strategies moving sit<>stand with L foot posterior to R to offset R WS and she was instructed in home hip self distraction for relief.    PT Treatment/Interventions  ADLs/Self Care Home Management;Gait training;Stair training;Functional mobility training;Therapeutic activities;Therapeutic exercise;Neuromuscular re-education;Electrical Stimulation;Traction;Moist Heat;Iontophoresis 4mg /ml Dexamethasone;Cryotherapy;Dry needling;Taping;Manual techniques;Patient/family education    PT Next Visit Plan  Check LTGs and determine course of action.  Assess pain after injection.    PT Home Exercise Plan  [id number]    Consulted and Agree with Plan of Care  Patient       Patient will benefit from skilled therapeutic intervention in order to improve the following deficits and impairments:  Hypomobility, Pain,  Postural dysfunction, Impaired sensation, Impaired flexibility, Decreased strength, Decreased range of motion, Decreased activity tolerance  Visit Diagnosis: Acute right-sided low back pain with sciatica, sciatica laterality unspecified  Muscle weakness  (generalized)  Other symptoms and signs involving the nervous system     Problem List Patient Active Problem List   Diagnosis Date Noted  . B12 deficiency 02/27/2018  . Lymphadenopathy 12/29/2017  . History of melanoma 12/29/2017  . Radiculitis of right cervical region 12/03/2017  . Stress fracture of metatarsal bone of right foot 05/24/2017  . Asthma, mild persistent 10/04/2015  . Abnormal weight gain 01/20/2015  . Tachycardia 05/31/2014  . Recurrent UTI 05/06/2014  . DUB (dysfunctional uterine bleeding) 05/06/2014  . Lumbar spondylosis 04/02/2013  . Migraine 02/13/2012  . Major depression, recurrent ([place]) 12/14/2011  . Hypothyroid 11/01/2010  . PCOS (polycystic ovarian syndrome) 11/01/2010  . Chronic right SI joint pain 11/01/2010  . Polyarthritis with left third PIP osteoarthritis 11/01/2010  . Melanoma ([place]) 11/01/2010  . Essential hypertension, benign 11/01/2010  . ANEMIA, IRON DEFICIENCY 10/17/2010  . INSOMNIA, CHRONIC 10/17/2010  . FATIGUE 10/17/2010  . EDEMA 10/17/2010    Jovi Alvizo , PT 04/15/2019, 12:38 PM  [facility] [place], [place], [address] Phone: [phone]   Fax:  [phone]  Name: Carly [name] MRN: [id number] Date of Birth: [birth year]

## 2019-04-17 ENCOUNTER — Other Ambulatory Visit: Payer: Self-pay

## 2019-04-17 ENCOUNTER — Ambulatory Visit
Admission: RE | Admit: 2019-04-17 | Discharge: 2019-04-17 | Disposition: A | Discharge status: Home / Self Care | Payer: BC Managed Care – PPO | Source: Ambulatory Visit | Attending: Sports Medicine | Admitting: Sports Medicine

## 2019-04-17 DIAGNOSIS — I10 Essential (primary) hypertension: Secondary | ICD-10-CM | POA: Diagnosis not present

## 2019-04-17 DIAGNOSIS — M5126 Other intervertebral disc displacement, lumbar region: Secondary | ICD-10-CM | POA: Diagnosis not present

## 2019-04-17 DIAGNOSIS — E282 Polycystic ovarian syndrome: Secondary | ICD-10-CM | POA: Diagnosis not present

## 2019-04-17 DIAGNOSIS — M47816 Spondylosis without myelopathy or radiculopathy, lumbar region: Secondary | ICD-10-CM

## 2019-04-17 DIAGNOSIS — E039 Hypothyroidism, unspecified: Secondary | ICD-10-CM | POA: Diagnosis not present

## 2019-04-17 MED ORDER — IOPAMIDOL (ISOVUE-M 200) INJECTION 41%
1.0000 mL | Freq: Once | INTRAMUSCULAR | Status: AC
  Administered 2019-04-17: 1 mL via EPIDURAL

## 2019-04-17 MED ORDER — METHYLPREDNISOLONE ACETATE 40 MG/ML INJ SUSP (RADIOLOG
120.0000 mg | Freq: Once | INTRAMUSCULAR | Status: AC
  Administered 2019-04-17: 120 mg via EPIDURAL

## 2019-04-18 LAB — BASIC METABOLIC PANEL WITH GFR
BUN: 13 mg/dL (ref 7–25)
CO2: 28 mmol/L (ref 20–32)
Calcium: 9.5 mg/dL (ref 8.6–10.2)
Chloride: 101 mmol/L (ref 98–110)
Creat: 1.06 mg/dL (ref 0.50–1.10)
GFR, Est African American: 71 mL/min/{1.73_m2} (ref >=60)
GFR, Est Non African American: 62 mL/min/{1.73_m2} (ref >=60)
Glucose, Bld: 89 mg/dL (ref 65–99)
Potassium: 3.9 mmol/L (ref 3.5–5.3)
Sodium: 137 mmol/L (ref 135–146)

## 2019-04-18 LAB — HEMOGLOBIN A1C
Hgb A1c MFr Bld: 5.1 % of total Hgb (ref <5.7)
Mean Plasma Glucose: 100 (calc)
eAG (mmol/L): 5.5 (calc)

## 2019-04-18 LAB — TSH: TSH: 0.02 mIU/L — ABNORMAL LOW

## 2019-04-20 ENCOUNTER — Encounter: Payer: Self-pay | Admitting: Family Medicine

## 2019-04-20 ENCOUNTER — Other Ambulatory Visit: Payer: Self-pay | Admitting: Family Medicine

## 2019-04-20 DIAGNOSIS — E039 Hypothyroidism, unspecified: Secondary | ICD-10-CM

## 2019-04-20 MED ORDER — SYNTHROID 125 MCG PO TABS: 125.0000 ug | ORAL_TABLET | Freq: Every day | ORAL | 0 refills | Status: DC

## 2019-04-20 NOTE — Telephone Encounter (Signed)
Per result notes: "Your thyroid level is a little too low it is overly suppressed. That means you are overmedicated. It has been too low for really almost a year now. I know we had decreased your dose 8 months ago but never rechecked it. Based on the my chart from June I believe you are taking 125 mcg 5 days a week and 137 mcg 2 days a week. Please stop the 137 mcg dose completely and only take the 125 mcg dose daily. Lets please recheck your level again in 6 weeks so that we can see if this is effective or not. It is difficult if we go months before rechecking to know if the change has really been effective."  I have sent refill for 125 mcg QD and reordered lab work for 6 weeks

## 2019-04-22 ENCOUNTER — Other Ambulatory Visit: Payer: Self-pay

## 2019-04-22 ENCOUNTER — Ambulatory Visit (INDEPENDENT_AMBULATORY_CARE_PROVIDER_SITE_OTHER): Payer: BC Managed Care – PPO | Admitting: Rehabilitative and Restorative Service Providers"

## 2019-04-22 ENCOUNTER — Encounter: Payer: Self-pay | Admitting: Rehabilitative and Restorative Service Providers"

## 2019-04-22 DIAGNOSIS — M6281 Muscle weakness (generalized): Secondary | ICD-10-CM

## 2019-04-22 DIAGNOSIS — R29818 Other symptoms and signs involving the nervous system: Secondary | ICD-10-CM

## 2019-04-22 DIAGNOSIS — M5440 Lumbago with sciatica, unspecified side: Secondary | ICD-10-CM

## 2019-04-22 NOTE — Therapy (Signed)
[place], [place], [address] Phone: [phone]   Fax:  [phone]  Physical Therapy Treatment and Renewal  Patient Details  Name: Carly [name] MRN: [id number] Date of Birth: 02-Oct-1970 Referring Provider (PT): Silverio Decamp, MD   Encounter Date: 04/22/2019  PT End of Session - 04/22/19 0722    Visit Number  12    Number of Visits  12    Date for PT Re-Evaluation  04/24/19    Authorization Type  BCBS    PT Start Time  0717    PT Stop Time  0800    PT Time Calculation (min)  43 min    Activity Tolerance  Patient tolerated treatment well    Behavior During Therapy  [facility] for tasks assessed/performed       Past Medical History:  Diagnosis Date  . Arthritis   . Hypertension   . PCOS (polycystic ovarian syndrome)   . Thyroid disease   . TIA (transient ischemic attack)     Past Surgical History:  Procedure Laterality Date  . 2 ectopic pregnancies  0092,3300  . lymph nodes    . melanoma removal     RT SIDE  . removal of fallopian tube  1999    There were no vitals filed for this visit.  Subjective Assessment - 04/22/19 0722    Subjective  The patient reports she has not had pain since Sunday due to having an injection on Friday.    Pertinent History  PCOS, HTN, allergies    Patient Stated Goals  Decrease or eliminate pain.    Currently in Pain?  No/denies                       [facility] Adult PT Treatment/Exercise - 04/22/19 0724      Self-Care   Self-Care  Other Self-Care Comments    Other Self-Care Comments   Discussed peloton bike and app options.  The patient used to ride 5-6 days per week and did 30-45 minute rides.  We discussed needing to perform cross training activities to vary her routine as that could be leading to increased hip flexor tightness.    We discussed stretching on peloton app or yoga.  Patient has tried yoga and it moves quickly, therefore  provided yoga with adriene as possible option (free on you tube),  Also recommended walking for exercise.  We discussed beginning return to stationary bike 2x/week for 20 minute rides and then reassess progress.        Exercises   Exercises  Lumbar      Lumbar Exercises: Stretches   Active Hamstring Stretch  Right;Left;1 rep;20 seconds    Single Knee to Chest Stretch  Right;Left;1 rep;20 seconds    Double Knee to Chest Stretch  1 rep;20 seconds    Quad Stretch  Right;Left;1 rep;20 seconds    Quad Stretch Limitations  tried in sitting and modified to standing    Piriformis Stretch  Right;Left;1 rep;20 seconds    Gastroc Stretch  Right;Left;1 rep;20 seconds    Other Lumbar Stretch Exercise  seated butterfly groin stretch      Lumbar Exercises: Aerobic   Tread Mill  1.6 mph 2 4 minutes without UE support      Lumbar Exercises: Supine   Bridge  5 reps      Lumbar Exercises: Quadruped   Madcat/Old Horse  5 reps    Straight Leg Raise  5  reps    Straight Leg Raises Limitations  alternating with emphasis on core stability and TA contraction             PT Education - 04/22/19 1459    Education Details  Reviewed HEP and consolidated/discussed progression; see self care for discussion of home exercise options    Person(s) Educated  Patient    Methods  Explanation;Demonstration;Handout    Comprehension  Verbalized understanding;Returned demonstration          PT Long Term Goals - 04/22/19 1506      PT LONG TERM GOAL #1   Title  The patient will be indep with initial HEP.    Baseline  The patient has HEP-- Plan to continue to progress adding loading and return to home cardio/wellness program.    Time  6    Period  Weeks    Status  Revised    Target Date  06/03/19      PT LONG TERM GOAL #2   Title  The patient will reduce disability per FOTO from 40% limitation to < or equal to 30% limitation.    Baseline  60% FOTO score at eval-- plan to continue to this goal.    Time  6     Period  Weeks    Status  Revised    Target Date  06/03/19      PT LONG TERM GOAL #3   Title  The patient will tolerate lumbar flexion to reach the floor without R sacral pain.    Time  6    Period  Weeks    Status  Revised    Target Date  06/03/19      PT LONG TERM GOAL #4   Title  The patient will  be able to sit in a chair without R gluteal pain and without compensatory L leaning.    Time  6    Period  Weeks    Status  Achieved      PT LONG TERM GOAL #5   Title  The patient will report pain at worst < or equal to 4/10.    Baseline  no pain since injection last week.    Time  6    Period  Weeks    Status  Achieved          Plan - 04/22/19 1504    Clinical Impression Statement  The patient has met 2 LTGs.  PT to continue with remaining goals to progress strengthening and return to prior functional status.  The patient has significant improvement in R LE pain after undergoing injection last week.  We discussed utilizing time now to strengthen her core and work on symmetry + stretching as she could not tolerate these activities (like bridges) consistently prior to injection.  The patient continues with tightness in R LE and PT and patient discussed home wellness options working on slowly returning to regular cardio/strengthening.    PT Frequency  2x / week    PT Duration  6 weeks    PT Treatment/Interventions  ADLs/Self Care Home Management;Gait training;Stair training;Functional mobility training;Therapeutic activities;Therapeutic exercise;Neuromuscular re-education;Electrical Stimulation;Traction;Moist Heat;Iontophoresis 60m/ml Dexamethasone;Cryotherapy;Dry needling;Taping;Manual techniques;Patient/family education    PT Next Visit Plan  Check LTGs and determine course of action.  Assess pain after injection.    PT Home Exercise Plan  [id number]   Consulted and Agree with Plan of Care  Patient        Patient will benefit from skilled therapeutic intervention  in order to  improve the following deficits and impairments:  Hypomobility, Pain, Postural dysfunction, Impaired sensation, Impaired flexibility, Decreased strength, Decreased range of motion, Decreased activity tolerance  Visit Diagnosis: Acute right-sided low back pain with sciatica, sciatica laterality unspecified  Muscle weakness (generalized)  Other symptoms and signs involving the nervous system     Problem List Patient Active Problem List   Diagnosis Date Noted  . B12 deficiency 02/27/2018  . Lymphadenopathy 12/29/2017  . History of melanoma 12/29/2017  . Radiculitis of right cervical region 12/03/2017  . Stress fracture of metatarsal bone of right foot 05/24/2017  . Asthma, mild persistent 10/04/2015  . Abnormal weight gain 01/20/2015  . Tachycardia 05/31/2014  . Recurrent UTI 05/06/2014  . DUB (dysfunctional uterine bleeding) 05/06/2014  . Lumbar spondylosis 04/02/2013  . Migraine 02/13/2012  . Major depression, recurrent ([place]) 12/14/2011  . Hypothyroid 11/01/2010  . PCOS (polycystic ovarian syndrome) 11/01/2010  . Chronic right SI joint pain 11/01/2010  . Polyarthritis with left third PIP osteoarthritis 11/01/2010  . Melanoma ([place]) 11/01/2010  . Essential hypertension, benign 11/01/2010  . ANEMIA, IRON DEFICIENCY 10/17/2010  . INSOMNIA, CHRONIC 10/17/2010  . FATIGUE 10/17/2010  . EDEMA 10/17/2010    Lakina Mcintire, PT 04/22/2019, 3:09 PM  [facility] [place], [place], [address] Phone: [phone]   Fax:  [phone]  Name: Khalea Ventura MRN: [id number] Date of Birth: 1970-10-01

## 2019-04-22 NOTE — Patient Instructions (Signed)
Access Code: [email or web address] URL: [email or web address] Date: 04/22/2019 Prepared by: Rudell Cobb  Exercises Supine Single Knee to Chest Stretch - 2 x daily - 7 x weekly - 1 sets - 2 reps - 30 seconds hold Supine Double Knee to Chest - 2 x daily - 7 x weekly - 1 sets - 2 reps - 30 seconds hold Hooklying Hamstring Stretch with Strap - 2 x daily - 7 x weekly - 2 reps - 1 sets - 20 seconds hold Supine Piriformis Stretch - Non-Involved Leg (BKA) - 2 x daily - 7 x weekly - 1 sets - 2 reps - 20 seconds hold Butterfly Groin Stretch - 2 x daily - 7 x weekly - 1 sets - 2 reps - 20 seconds hold Standing Quadriceps Stretch - 2 x daily - 7 x weekly - 1 sets - 3 reps - 20 seconds hold Supine Bridge - 2 x daily - 7 x weekly - 1 sets - 10 reps - 3 seconds hold Beginner Front Arm Support - 2 x daily - 7 x weekly - 1 sets - 10 reps - 3 seconds hold

## 2019-04-27 ENCOUNTER — Other Ambulatory Visit: Payer: Self-pay

## 2019-04-27 ENCOUNTER — Encounter: Payer: Self-pay | Admitting: Rehabilitative and Restorative Service Providers"

## 2019-04-27 ENCOUNTER — Ambulatory Visit (INDEPENDENT_AMBULATORY_CARE_PROVIDER_SITE_OTHER): Payer: BC Managed Care – PPO | Admitting: Rehabilitative and Restorative Service Providers"

## 2019-04-27 DIAGNOSIS — M5440 Lumbago with sciatica, unspecified side: Secondary | ICD-10-CM | POA: Diagnosis not present

## 2019-04-27 DIAGNOSIS — R29818 Other symptoms and signs involving the nervous system: Secondary | ICD-10-CM

## 2019-04-27 DIAGNOSIS — M6281 Muscle weakness (generalized): Secondary | ICD-10-CM

## 2019-04-27 NOTE — Patient Instructions (Addendum)
Access Code: [email or web address]: [email or web address]: 04/05/2021Prepared by: Osie Amparo HoltExercises  Supine Single Knee to Chest Stretch - 2 x daily - 7 x weekly - 1 sets - 2 reps - 30 seconds hold  Supine Double Knee to Chest - 2 x daily - 7 x weekly - 1 sets - 2 reps - 30 seconds hold  Hooklying Hamstring Stretch with Strap - 2 x daily - 7 x weekly - 2 reps - 1 sets - 20 seconds hold  Supine Piriformis Stretch - Non-Involved Leg (BKA) - 2 x daily - 7 x weekly - 1 sets - 2 reps - 20 seconds hold  Butterfly Groin Stretch - 2 x daily - 7 x weekly - 1 sets - 2 reps - 20 seconds hold  Standing Quadriceps Stretch - 2 x daily - 7 x weekly - 1 sets - 3 reps - 20 seconds hold  Supine Bridge - 2 x daily - 7 x weekly - 1 sets - 10 reps - 3 seconds hold  Beginner Front Arm Support - 2 x daily - 7 x weekly - 1 sets - 10 reps - 3 seconds hold  Standing Row with Anchored Resistance - 2 x daily - 7 x weekly - 2 sets - 15 reps  Shoulder extension with resistance - Neutral - 2 x daily - 7 x weekly - 15 reps - 2 sets

## 2019-04-27 NOTE — Therapy (Signed)
[place], [place], [address] Phone: [phone]   Fax:  [phone]  Physical Therapy Treatment  Patient Details  Name: Carly [name] MRN: [id number] Date of Birth: [birth year] Referring Provider (PT): Silverio Decamp, MD   Encounter Date: 04/27/2019  PT End of Session - 04/27/19 0721    Visit Number  13    Number of Visits  24    Date for PT Re-Evaluation  06/03/19    Authorization Type  BCBS    PT Start Time  0719    PT Stop Time  0800    PT Time Calculation (min)  41 min       Past Medical History:  Diagnosis Date  . Arthritis   . Hypertension   . PCOS (polycystic ovarian syndrome)   . Thyroid disease   . TIA (transient ischemic attack)     Past Surgical History:  Procedure Laterality Date  . 2 ectopic pregnancies  [id number]  . lymph nodes    . melanoma removal     RT SIDE  . removal of fallopian tube  1999    There were no vitals filed for this visit.  Subjective Assessment - 04/27/19 0725    Subjective  Doing well - Rode stationaly bike and did some yoga this weekend. Went shopping and was able to walk without increased pain. Working on exercises and is encouraged by her progress.    Currently in Pain?  No/denies                       [facility] Adult PT Treatment/Exercise - 04/27/19 0001      Lumbar Exercises: Stretches   Passive Hamstring Stretch  Right;2 reps;30 seconds   pulling LE across body to stretch lateral hamstring    Passive Hamstring Stretch Limitations  supine with strap, knee slightly bent       Lumbar Exercises: Aerobic   Elliptical  1.5 x 5 min       Lumbar Exercises: Standing   Row  Strengthening;Both;15 reps;Theraband    Theraband Level (Row)  Level 3 (Green)    Row Limitations  bow and arrow x 15 each side     Shoulder Extension  Strengthening;Both;15 reps;Theraband    Theraband Level (Shoulder Extension)  Level 3 (Green)      Moist Heat Therapy   Number Minutes Moist Heat  5 Minutes    Moist Heat Location  Hip   posterior thigh      Manual Therapy   Manual therapy comments  skilled palpation to assess tissue for DN     Joint Mobilization  Rt hip PA mobs    Soft tissue mobilization  deep tissue work through the Rt hamstrings and lateral thigh through the TFL and ITB     Myofascial Release  post Rt thigh        Trigger Point Dry Needling - 04/27/19 0001    Consent Given?  Yes    Education Handout Provided  Previously provided    Dry Needling Comments  right    Hamstring Response  Palpable increased muscle length           PT Education - 04/27/19 0736    Education Details  HEP    Person(s) Educated  Patient    Methods  Explanation;Demonstration;Tactile cues;Verbal cues;Handout    Comprehension  Verbalized understanding;Returned demonstration;Verbal cues required;Tactile cues required          PT  Long Term Goals - 04/22/19 1506      PT LONG TERM GOAL #1   Title  The patient will be indep with initial HEP.    Baseline  The patient has HEP-- Plan to continue to progress adding loading and return to home cardio/wellness program.    Time  6    Period  Weeks    Status  Revised    Target Date  06/03/19      PT LONG TERM GOAL #2   Title  The patient will reduce disability per FOTO from 40% limitation to < or equal to 30% limitation.    Baseline  60% FOTO score at eval-- plan to continue to this goal.    Time  6    Period  Weeks    Status  Revised    Target Date  06/03/19      PT LONG TERM GOAL #3   Title  The patient will tolerate lumbar flexion to reach the floor without R sacral pain.    Time  6    Period  Weeks    Status  Revised    Target Date  06/03/19      PT LONG TERM GOAL #4   Title  The patient will  be able to sit in a chair without R gluteal pain and without compensatory L leaning.    Time  6    Period  Weeks    Status  Achieved      PT LONG TERM GOAL #5   Title  The  patient will report pain at worst < or equal to 4/10.    Baseline  no pain since injection last week.    Time  6    Period  Weeks    Status  Achieved            Plan - 04/27/19 0723    Clinical Impression Statement  Patient reports that she is continuing to increase activity level at home. Added stationary bike and some yoga over the weekend. Went shopping and walking was OK. Added UE resistive exercises to work on core. DN to hamstrings with good response.    Rehab Potential  Good    PT Frequency  2x / week    PT Duration  6 weeks    PT Treatment/Interventions  ADLs/Self Care Home Management;Gait training;Stair training;Functional mobility training;Therapeutic activities;Therapeutic exercise;Neuromuscular re-education;Electrical Stimulation;Traction;Moist Heat;Iontophoresis 4mg /ml Dexamethasone;Cryotherapy;Dry needling;Taping;Manual techniques;Patient/family education    PT Next Visit Plan  Continue with core strengthening and stabilization activities    PT Home Exercise Plan  [id number]    Consulted and Agree with Plan of Care  Patient       Patient will benefit from skilled therapeutic intervention in order to improve the following deficits and impairments:     Visit Diagnosis: Acute right-sided low back pain with sciatica, sciatica laterality unspecified  Muscle weakness (generalized)  Other symptoms and signs involving the nervous system     Problem List Patient Active Problem List   Diagnosis Date Noted  . B12 deficiency 02/27/2018  . Lymphadenopathy 12/29/2017  . History of melanoma 12/29/2017  . Radiculitis of right cervical region 12/03/2017  . Stress fracture of metatarsal bone of right foot 05/24/2017  . Asthma, mild persistent 10/04/2015  . Abnormal weight gain 01/20/2015  . Tachycardia 05/31/2014  . Recurrent UTI 05/06/2014  . DUB (dysfunctional uterine bleeding) 05/06/2014  . Lumbar spondylosis 04/02/2013  . Migraine 02/13/2012  . Major depression,  recurrent ([place]) 12/14/2011  .  Hypothyroid 11/01/2010  . PCOS (polycystic ovarian syndrome) 11/01/2010  . Chronic right SI joint pain 11/01/2010  . Polyarthritis with left third PIP osteoarthritis 11/01/2010  . Melanoma ([place]) 11/01/2010  . Essential hypertension, benign 11/01/2010  . ANEMIA, IRON DEFICIENCY 10/17/2010  . INSOMNIA, CHRONIC 10/17/2010  . FATIGUE 10/17/2010  . EDEMA 10/17/2010    Izaias Krupka Nilda Simmer PT, MPH  04/27/2019, 8:00 AM  [facility] [place], [place], [address] Phone: [phone]   Fax:  [phone]  Name: Carly [name] MRN: [id number] Date of Birth: August 22, 1970

## 2019-04-29 ENCOUNTER — Ambulatory Visit (INDEPENDENT_AMBULATORY_CARE_PROVIDER_SITE_OTHER): Payer: BC Managed Care – PPO | Admitting: Rehabilitative and Restorative Service Providers"

## 2019-04-29 ENCOUNTER — Encounter: Payer: Self-pay | Admitting: Rehabilitative and Restorative Service Providers"

## 2019-04-29 ENCOUNTER — Other Ambulatory Visit: Payer: Self-pay

## 2019-04-29 DIAGNOSIS — R29818 Other symptoms and signs involving the nervous system: Secondary | ICD-10-CM

## 2019-04-29 DIAGNOSIS — M6281 Muscle weakness (generalized): Secondary | ICD-10-CM

## 2019-04-29 DIAGNOSIS — M5440 Lumbago with sciatica, unspecified side: Secondary | ICD-10-CM

## 2019-04-29 NOTE — Therapy (Addendum)
[place], [place], [address] Phone: [phone]   Fax:  [phone]  Physical Therapy Treatment  Patient Details  Name: Carly [name] MRN: [id number] Date of Birth: 07/01/1970 Referring Provider (PT): Carly Decamp, MD   Encounter Date: 04/29/2019  PT End of Session - 04/29/19 0716    Visit Number  14    Number of Visits  24    Date for PT Re-Evaluation  06/03/19    PT Start Time  0715    PT Stop Time  0800    PT Time Calculation (min)  45 min    Activity Tolerance  Patient tolerated treatment well       Past Medical History:  Diagnosis Date  . Arthritis   . Hypertension   . PCOS (polycystic ovarian syndrome)   . Thyroid disease   . TIA (transient ischemic attack)     Past Surgical History:  Procedure Laterality Date  . 2 ectopic pregnancies  1324,4010  . lymph nodes    . melanoma removal     RT SIDE  . removal of fallopian tube  1999    There were no vitals filed for this visit.  Subjective Assessment - 04/29/19 0719    Subjective  Very sore following DN last visit. But no pain this morning. She sat for an hour and a half in a meeting yesterday - was "stiff" when she got up but did not have pain.    Currently in Pain?  No/denies    Pain Score  0-No pain         [facility] PT Assessment - 04/29/19 0001      Assessment   Medical Diagnosis  lumbar spondylosis L5-S1    Referring Provider (PT)  Carly Decamp, MD    Prior Therapy  none      Observation/Other Assessments   Focus on Therapeutic Outcomes (FOTO)   17% limitation       AROM   Lumbar Flexion  75%    Lumbar Extension  50%    Lumbar - Right Side Bend  80%    Lumbar - Left Side Bend  80%    Lumbar - Right Rotation  60%    Lumbar - Left Rotation  60%      Strength   Right Hip Flexion  5/5    Right Hip Extension  5/5    Right Hip ABduction  5/5    Left Hip Flexion  5/5    Left Hip Extension  5/5     Left Hip ABduction  5/5      Flexibility   Hamstrings  WFL's       Palpation   Palpation comment  mild muscular tightness Rt > Lt posterior hip and hamstrings                   [facility] Adult PT Treatment/Exercise - 04/29/19 0001      Lumbar Exercises: Stretches   Passive Hamstring Stretch  Right;2 reps;30 seconds   pulling LE across body to stretch lateral hamstring    Passive Hamstring Stretch Limitations  supine with strap, knee slightly bent     Lower Trunk Rotation  2 reps   20 sec    Hip Flexor Stretch  Right;3 reps;30 seconds   seated    Other Lumbar Stretch Exercise  3 way doorway stretch 2 reps each position x 30 sec hold       Lumbar  Exercises: Aerobic   Tread Mill  2.0 mph x 5 min       Lumbar Exercises: Standing   Row  Strengthening;Both;15 reps;Theraband    Theraband Level (Row)  Level 3 (Green)    Row Limitations  bow and arrow x 15 each side     Shoulder Extension  Strengthening;Both;15 reps;Theraband    Theraband Level (Shoulder Extension)  Level 3 (Green)    Shoulder Extension Limitations  anti-rotation green TB - x 10 reps     Other Standing Lumbar Exercises  anti-rotation x 15 reps green TB     Other Standing Lumbar Exercises  counter plank 60 sec x 1; alternate hip extension x 10 each LE              PT Education - 04/29/19 0744    Education Details  HEP    Person(s) Educated  Patient    Methods  Explanation;Demonstration;Tactile cues;Verbal cues;Handout    Comprehension  Verbalized understanding;Returned demonstration;Verbal cues required;Tactile cues required          PT Long Term Goals - 04/29/19 0758      PT LONG TERM GOAL #1   Title  The patient will be indep with initial HEP.    Baseline  -    Time  6    Period  Weeks    Status  Achieved      PT LONG TERM GOAL #2   Title  The patient will reduce disability per FOTO from 40% limitation to < or equal to 30% limitation.    Baseline  -    Time  6    Period  Weeks     Status  Achieved      PT LONG TERM GOAL #3   Title  The patient will tolerate lumbar flexion to reach the floor without R sacral pain.    Time  6    Period  Weeks    Status  Achieved      PT LONG TERM GOAL #4   Title  The patient will  be able to sit in a chair without R gluteal pain and without compensatory L leaning.    Time  6    Period  Weeks    Status  Achieved      PT LONG TERM GOAL #5   Title  The patient will report pain at worst < or equal to 4/10.    Baseline  -    Time  6    Period  Weeks    Status  Achieved            Plan - 04/29/19 0717    Clinical Impression Statement  Patient reports that she was very sore in the Rt lateral thigh from the DN last visit. She is continuing with HEP and gradually increasing exercise and activity.    Rehab Potential  Good    PT Frequency  2x / week    PT Duration  6 weeks    PT Treatment/Interventions  ADLs/Self Care Home Management;Gait training;Stair training;Functional mobility training;Therapeutic activities;Therapeutic exercise;Neuromuscular re-education;Electrical Stimulation;Traction;Moist Heat;Iontophoresis 79m/ml Dexamethasone;Cryotherapy;Dry needling;Taping;Manual techniques;Patient/family education    PT Next Visit Plan  Continue with core strengthening and stabilization activities    PT Home Exercise Plan  [id number]   Consulted and Agree with Plan of Care  Patient       Patient will benefit from skilled therapeutic intervention in order to improve the following deficits and impairments:     Visit  Diagnosis: Acute right-sided low back pain with sciatica, sciatica laterality unspecified  Muscle weakness (generalized)  Other symptoms and signs involving the nervous system     Problem List Patient Active Problem List   Diagnosis Date Noted  . B12 deficiency 02/27/2018  . Lymphadenopathy 12/29/2017  . History of melanoma 12/29/2017  . Radiculitis of right cervical region 12/03/2017  . Stress fracture of  metatarsal bone of right foot 05/24/2017  . Asthma, mild persistent 10/04/2015  . Abnormal weight gain 01/20/2015  . Tachycardia 05/31/2014  . Recurrent UTI 05/06/2014  . DUB (dysfunctional uterine bleeding) 05/06/2014  . Lumbar spondylosis 04/02/2013  . Migraine 02/13/2012  . Major depression, recurrent ([place]) 12/14/2011  . Hypothyroid 11/01/2010  . PCOS (polycystic ovarian syndrome) 11/01/2010  . Chronic right SI joint pain 11/01/2010  . Polyarthritis with left third PIP osteoarthritis 11/01/2010  . Melanoma ([place]) 11/01/2010  . Essential hypertension, benign 11/01/2010  . ANEMIA, IRON DEFICIENCY 10/17/2010  . INSOMNIA, CHRONIC 10/17/2010  . FATIGUE 10/17/2010  . EDEMA 10/17/2010    Darrian Grzelak Nilda Simmer PT, MPH  04/29/2019, 8:03 AM  [facility] [place], [place], [address] Phone: [phone]   Fax:  [phone]  Name: Carly [name] MRN: [id number] Date of Birth: 11/22/1970  PHYSICAL THERAPY DISCHARGE SUMMARY  Visits from Start of Care: 14  Current functional level related to goals / functional outcomes: See last progress note for discharge status    Remaining deficits: Needs to continue with independent HEP    Education / Equipment: HEP   Plan: Patient agrees to discharge.  Patient goals were met. Patient is being discharged due to meeting the stated rehab goals.  ?????    Caran Storck P. Helene Kelp PT, MPH 05/28/19 9:37 AM

## 2019-04-29 NOTE — Patient Instructions (Signed)
Access Code: [email or web address]: [email or web address]: 04/07/2021Prepared by: Joycie Aerts HoltExercises  Supine Single Knee to Chest Stretch - 2 x daily - 7 x weekly - 1 sets - 2 reps - 30 seconds hold  Supine Double Knee to Chest - 2 x daily - 7 x weekly - 1 sets - 2 reps - 30 seconds hold  Hooklying Hamstring Stretch with Strap - 2 x daily - 7 x weekly - 2 reps - 1 sets - 20 seconds hold  Supine Piriformis Stretch - Non-Involved Leg (BKA) - 2 x daily - 7 x weekly - 1 sets - 2 reps - 20 seconds hold  Butterfly Groin Stretch - 2 x daily - 7 x weekly - 1 sets - 2 reps - 20 seconds hold  Standing Quadriceps Stretch - 2 x daily - 7 x weekly - 1 sets - 3 reps - 20 seconds hold  Supine Bridge - 2 x daily - 7 x weekly - 1 sets - 10 reps - 3 seconds hold  Beginner Front Arm Support - 2 x daily - 7 x weekly - 1 sets - 10 reps - 3 seconds hold  Standing Row with Anchored Resistance - 2 x daily - 7 x weekly - 2 sets - 15 reps  Shoulder extension with resistance - Neutral - 2 x daily - 7 x weekly - 15 reps - 2 sets  Doorway Pec Stretch at 60 Degrees Abduction - 3 x daily - 7 x weekly - 3 reps - 1 sets  Doorway Pec Stretch at 90 Degrees Abduction - 3 x daily - 7 x weekly - 3 reps - 1 sets - 30 seconds hold  Doorway Pec Stretch at 120 Degrees Abduction - 3 x daily - 7 x weekly - 3 reps - 1 sets - 30 second hold hold  Plank on Counter - 2 x daily - 7 x weekly - 1 sets - 3 reps - 60 sec hold  Plank with Hip Extension on Counter - 2 x daily - 7 x weekly - 1 sets - 10 reps - 3 sec hold

## 2019-05-10 ENCOUNTER — Other Ambulatory Visit: Payer: Self-pay | Admitting: Sports Medicine

## 2019-05-10 ENCOUNTER — Other Ambulatory Visit: Payer: Self-pay | Admitting: Family Medicine

## 2019-05-10 DIAGNOSIS — M47816 Spondylosis without myelopathy or radiculopathy, lumbar region: Secondary | ICD-10-CM

## 2019-05-11 ENCOUNTER — Encounter: Payer: BC Managed Care – PPO | Admitting: Physical Therapy

## 2019-05-13 ENCOUNTER — Encounter: Payer: BC Managed Care – PPO | Admitting: Physical Therapy

## 2019-05-18 ENCOUNTER — Encounter: Payer: BC Managed Care – PPO | Admitting: Physical Therapy

## 2019-05-20 ENCOUNTER — Encounter: Payer: BC Managed Care – PPO | Admitting: Rehabilitative and Restorative Service Providers"

## 2019-06-12 ENCOUNTER — Other Ambulatory Visit: Payer: Self-pay | Admitting: Sports Medicine

## 2019-06-12 DIAGNOSIS — M47816 Spondylosis without myelopathy or radiculopathy, lumbar region: Secondary | ICD-10-CM

## 2019-06-14 ENCOUNTER — Other Ambulatory Visit: Payer: Self-pay | Admitting: Family Medicine

## 2019-06-28 ENCOUNTER — Other Ambulatory Visit: Payer: Self-pay | Admitting: Family Medicine

## 2019-06-28 DIAGNOSIS — I10 Essential (primary) hypertension: Secondary | ICD-10-CM

## 2019-06-29 ENCOUNTER — Other Ambulatory Visit: Payer: Self-pay | Admitting: *Deleted

## 2019-07-09 ENCOUNTER — Other Ambulatory Visit: Payer: Self-pay | Admitting: Family Medicine

## 2019-07-20 DIAGNOSIS — E039 Hypothyroidism, unspecified: Secondary | ICD-10-CM | POA: Diagnosis not present

## 2019-07-21 LAB — TSH: TSH: 0.04 mIU/L — ABNORMAL LOW

## 2019-07-22 ENCOUNTER — Other Ambulatory Visit: Payer: Self-pay | Admitting: Sports Medicine

## 2019-07-22 ENCOUNTER — Other Ambulatory Visit: Payer: Self-pay | Admitting: Family Medicine

## 2019-07-22 ENCOUNTER — Other Ambulatory Visit: Payer: Self-pay | Admitting: Osteopathic Medicine

## 2019-07-22 ENCOUNTER — Encounter: Payer: Self-pay | Admitting: Family Medicine

## 2019-07-22 DIAGNOSIS — M47816 Spondylosis without myelopathy or radiculopathy, lumbar region: Secondary | ICD-10-CM

## 2019-07-22 DIAGNOSIS — D500 Iron deficiency anemia secondary to blood loss (chronic): Secondary | ICD-10-CM

## 2019-07-22 DIAGNOSIS — E039 Hypothyroidism, unspecified: Secondary | ICD-10-CM

## 2019-07-22 MED ORDER — SYNTHROID 112 MCG PO TABS
112.0000 ug | ORAL_TABLET | Freq: Every day | ORAL | 1 refills | Status: DC
Start: 2019-07-22 — End: 2019-07-22

## 2019-07-22 MED ORDER — LEVOTHYROXINE SODIUM 100 MCG PO TABS
100.0000 ug | ORAL_TABLET | Freq: Every day | ORAL | 1 refills | Status: DC
Start: 2019-07-22 — End: 2019-09-25

## 2019-08-09 ENCOUNTER — Other Ambulatory Visit: Payer: Self-pay | Admitting: Family Medicine

## 2019-08-18 ENCOUNTER — Other Ambulatory Visit: Payer: Self-pay

## 2019-08-18 ENCOUNTER — Ambulatory Visit (INDEPENDENT_AMBULATORY_CARE_PROVIDER_SITE_OTHER): Payer: BC Managed Care – PPO | Admitting: Sports Medicine

## 2019-08-18 DIAGNOSIS — M130 Polyarthritis, unspecified: Secondary | ICD-10-CM

## 2019-08-18 NOTE — Progress Notes (Signed)
    Procedures performed today:    Procedure: Real-time Ultrasound Guided injection of the left third PIP Device: Samsung HS60  Verbal informed consent obtained.  Time-out conducted.  Noted no overlying erythema, induration, or other signs of local infection.  Skin prepped in a sterile fashion.  Local anesthesia: Topical Ethyl chloride.  With sterile technique and under real time ultrasound guidance:  1/2 cc lidocaine, 1/2 cc kenalog 40 injected easily  completed without difficulty  Pain immediately resolved suggesting accurate placement of the medication.  Advised to call if fevers/chills, erythema, induration, drainage, or persistent bleeding.  Images permanently stored and available for review in the ultrasound unit.  Impression: Technically successful ultrasound guided injection.  Independent interpretation of notes and tests performed by another provider:   None.  Brief History, Exam, Impression, and Recommendations:    Polyarthritis with left third PIP osteoarthritis Recurrence of pain, repeat PIP injection. She has had negative rheumatoid testing recently, ANA was 1: 80, negative rheumatoid factor, negative CCP, negative 14-3-3 ETA protein. ESR normal, CK normal. She has historically seen a rheumatologist and has been on Plaquenil but cannot recall whether or not this was helpful. History of peptic ulcer disease avoiding NSAIDs. I would like her to get plugged in to another local rheumatologist, I would like a second opinion as to whether something like Plaquenil daily could be an option for her.    ___________________________________________ Gwen Her. Dianah Field, M.D., ABFM., CAQSM. Primary Care and [place] Instructor of [place] [facility] of Medicine

## 2019-08-18 NOTE — Assessment & Plan Note (Signed)
Recurrence of pain, repeat PIP injection. She has had negative rheumatoid testing recently, ANA was 1: 80, negative rheumatoid factor, negative CCP, negative 14-3-3 ETA protein. ESR normal, CK normal. She has historically seen a rheumatologist and has been on Plaquenil but cannot recall whether or not this was helpful. History of peptic ulcer disease avoiding NSAIDs. I would like her to get plugged in to another local rheumatologist, I would like a second opinion as to whether something like Plaquenil daily could be an option for her.

## 2019-08-30 ENCOUNTER — Other Ambulatory Visit: Payer: Self-pay | Admitting: Sports Medicine

## 2019-08-30 DIAGNOSIS — M47816 Spondylosis without myelopathy or radiculopathy, lumbar region: Secondary | ICD-10-CM

## 2019-09-20 ENCOUNTER — Encounter: Payer: Self-pay | Admitting: Family Medicine

## 2019-09-24 DIAGNOSIS — E039 Hypothyroidism, unspecified: Secondary | ICD-10-CM | POA: Diagnosis not present

## 2019-09-24 DIAGNOSIS — D500 Iron deficiency anemia secondary to blood loss (chronic): Secondary | ICD-10-CM | POA: Diagnosis not present

## 2019-09-24 LAB — CBC
HCT: 35.7 % (ref 35.0–45.0)
Hemoglobin: 12.3 g/dL (ref 11.7–15.5)
MCH: 30.6 pg (ref 27.0–33.0)
MCHC: 34.5 g/dL (ref 32.0–36.0)
MCV: 88.8 fL (ref 80.0–100.0)
MPV: 9.3 fL (ref 7.5–12.5)
Platelets: 381 10*3/uL (ref 140–400)
RBC: 4.02 10*6/uL (ref 3.80–5.10)
RDW: 12.9 % (ref 11.0–15.0)
WBC: 6.9 10*3/uL (ref 3.8–10.8)

## 2019-09-24 LAB — FERRITIN: Ferritin: 33 ng/mL (ref 16–232)

## 2019-09-24 LAB — TSH: TSH: 0.15 mIU/L — ABNORMAL LOW

## 2019-09-25 ENCOUNTER — Other Ambulatory Visit: Payer: Self-pay | Admitting: Family Medicine

## 2019-09-25 ENCOUNTER — Other Ambulatory Visit: Payer: Self-pay

## 2019-09-25 DIAGNOSIS — E039 Hypothyroidism, unspecified: Secondary | ICD-10-CM

## 2019-09-25 MED ORDER — LEVOTHYROXINE SODIUM 88 MCG PO TABS: 88.0000 ug | ORAL_TABLET | Freq: Every day | ORAL | 2 refills | Status: DC

## 2019-09-25 NOTE — Progress Notes (Signed)
Opened in error

## 2019-09-27 ENCOUNTER — Other Ambulatory Visit: Payer: Self-pay | Admitting: Family Medicine

## 2019-09-27 DIAGNOSIS — I10 Essential (primary) hypertension: Secondary | ICD-10-CM

## 2019-09-28 DIAGNOSIS — M47816 Spondylosis without myelopathy or radiculopathy, lumbar region: Secondary | ICD-10-CM

## 2019-09-29 NOTE — Telephone Encounter (Signed)
Epidural ordered, please contact [facility] for scheduling. 

## 2019-09-29 NOTE — Telephone Encounter (Signed)
Per appt desk, patient has been scheduled

## 2019-10-01 ENCOUNTER — Other Ambulatory Visit: Payer: Self-pay

## 2019-10-01 ENCOUNTER — Ambulatory Visit
Admission: RE | Admit: 2019-10-01 | Discharge: 2019-10-01 | Disposition: A | Discharge status: Home / Self Care | Payer: BC Managed Care – PPO | Source: Ambulatory Visit | Attending: Sports Medicine | Admitting: Sports Medicine

## 2019-10-01 DIAGNOSIS — M47816 Spondylosis without myelopathy or radiculopathy, lumbar region: Secondary | ICD-10-CM

## 2019-10-01 DIAGNOSIS — M47817 Spondylosis without myelopathy or radiculopathy, lumbosacral region: Secondary | ICD-10-CM | POA: Diagnosis not present

## 2019-10-01 MED ORDER — METHYLPREDNISOLONE ACETATE 40 MG/ML INJ SUSP (RADIOLOG: 120.0000 mg | Freq: Once | INTRAMUSCULAR | Status: DC

## 2019-10-01 MED ORDER — IOPAMIDOL (ISOVUE-M 200) INJECTION 41%: 1.0000 mL | Freq: Once | INTRAMUSCULAR | Status: DC

## 2019-10-07 ENCOUNTER — Ambulatory Visit (INDEPENDENT_AMBULATORY_CARE_PROVIDER_SITE_OTHER): Payer: BC Managed Care – PPO | Admitting: Rehabilitative and Restorative Service Providers"

## 2019-10-07 ENCOUNTER — Other Ambulatory Visit: Payer: Self-pay

## 2019-10-07 ENCOUNTER — Encounter: Payer: Self-pay | Admitting: Rehabilitative and Restorative Service Providers"

## 2019-10-07 DIAGNOSIS — M5441 Lumbago with sciatica, right side: Secondary | ICD-10-CM

## 2019-10-07 DIAGNOSIS — R29898 Other symptoms and signs involving the musculoskeletal system: Secondary | ICD-10-CM

## 2019-10-07 DIAGNOSIS — R29818 Other symptoms and signs involving the nervous system: Secondary | ICD-10-CM | POA: Diagnosis not present

## 2019-10-07 DIAGNOSIS — M6281 Muscle weakness (generalized): Secondary | ICD-10-CM

## 2019-10-07 NOTE — Therapy (Signed)
[place], [place], [address] Phone: [phone]   Fax:  [phone]  Physical Therapy Evaluation  Patient Details  Name: Carly [name] MRN: [id number] Date of Birth: 29-Jan-1970 Referring Provider (PT): Dr Dianah Field    Encounter Date: 10/07/2019   PT End of Session - 10/07/19 1723    Visit Number 1    Number of Visits 12    Date for PT Re-Evaluation 11/18/19    PT Start Time 6256    PT Stop Time 1608    PT Time Calculation (min) 52 min    Activity Tolerance Patient tolerated treatment well           Past Medical History:  Diagnosis Date  . Arthritis   . Hypertension   . PCOS (polycystic ovarian syndrome)   . Thyroid disease   . TIA (transient ischemic attack)     Past Surgical History:  Procedure Laterality Date  . 2 ectopic pregnancies  3893,7342  . lymph nodes    . melanoma removal     RT SIDE  . removal of fallopian tube  1999    There were no vitals filed for this visit.    Subjective Assessment - 10/07/19 1524    Subjective Patient reports that she had good results with [facility] 4/21 but started having pain again ~ 1 month ago. She received another ESI last week with good results. She has contintued tightness and pain in the posterior Rt thigh/hamstrings.    Pertinent History LBP since 2005 treated with injections - injections since 2010; HTN; melenoma    Patient Stated Goals decrease tightness in hamstring    Currently in Pain? Yes    Pain Score 3     Pain Location Buttocks    Pain Orientation Right    Pain Descriptors / Indicators Tightness;Nagging    Pain Radiating Towards posterior hip to posterior thigh    Pain Onset More than a month ago    Pain Frequency Constant    Aggravating Factors  sitting; intense movement/working/exercise    Pain Relieving Factors ESI; DN helps tightness              [facility] PT Assessment - 10/07/19 0001      Assessment   Medical  Diagnosis Rt lumbar spondylosis    Referring Provider (PT) Dr Dianah Field     Onset Date/Surgical Date 09/06/19    Hand Dominance Right    Next MD Visit PRN     Prior Therapy here for LBP       Precautions   Precautions None      Restrictions   Weight Bearing Restrictions No      Balance Screen   Has the patient fallen in the past 6 months No    Has the patient had a decrease in activity level because of a fear of falling?  No    Is the patient reluctant to leave their home because of a fear of falling?  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 [employer]     Leisure household chores; hiking; camping; exercise daily 10-30 min aerobic and free weights/stretching; gardening       Observation/Other Assessments   Focus  on Therapeutic Outcomes (FOTO)  42% limitation       Sensation   Additional Comments WFL's per pt report       Posture/Postural Control   Posture Comments sits with wt shifted to either side      AROM   Right/Left Hip --   tight end ranges extension and rotation   Lumbar Flexion 70% pulling Rt posterior hip     Lumbar Extension 60% pain Rt buttock    Lumbar - Right Side Bend 90%     Lumbar - Left Side Bend 90%     Lumbar - Right Rotation 40% pain Rt buttock     Lumbar - Left Rotation 40% pain Rt buttock      Strength   Overall Strength Comments WFL's bilat LE's not tested resistively       Flexibility   Hamstrings tight Rt > Lt     Quadriceps tight Rt > Lt     ITB tight Rt > Lt     Piriformis tight Rt > Lt       Palpation   Spinal mobility hypomobility lower lumbar spine with PA mobs     Palpation comment muscular tightness through the Rt lumbar spine/lats/QL/piriformis/gluts/hamstrings       Special Tests    Other special tests (-) SLR bilat; (+) slump test Rt (-) Lt                       Objective measurements completed on examination: See above findings.       [facility] Adult PT Treatment/Exercise - 10/07/19 0001      Moist Heat Therapy   Number Minutes Moist Heat 10 Minutes    Moist Heat Location Lumbar Spine;Hip;Knee   Rt hamstrings     Manual Therapy   Manual therapy comments skilled palpation to assess soft tissue response to DN amd manual therapy     Joint Mobilization PA mobs Rt greater trochanter     Soft tissue mobilization deep tissue work through the Rt lumbar and posterior hip/buttocks into Rt hamstrings     Myofascial Release Rt posterior hip     Passive ROM IR/ER Rt hip pt prone hip extended; knee flexed to 90 deg             Trigger Point Dry Needling - 10/07/19 0001    Consent Given? Yes    Education Handout Provided Yes    Other Dry Needling Rt    Hamstring Response Palpable increased muscle length    Gluteus Medius Response Palpable increased muscle length    Gluteus Maximus Response Palpable increased muscle length    Piriformis Response Palpable increased muscle length    Lumbar multifidi Response Palpable increased muscle length                PT Education - 10/07/19 1721    Education Details TD; review of HEP; POC    Person(s) Educated Patient    Methods Explanation;Handout    Comprehension Verbalized understanding               PT Long Term Goals - 10/07/19 1728      PT LONG TERM GOAL #1   Title Decrease LBP and Rt LE pain by 50-75% allowing patient to return to more normal functional activity level    Time 6    Period Weeks    Status New    Target Date 11/18/19      PT LONG  TERM GOAL #2   Title Improve trunk mobility to WFL's and pain free    Time 6    Period Weeks    Status New    Target Date 11/18/19      PT LONG TERM GOAL #3   Title Patient to demonstrate core strengthening and stabilization program to  decrease risk of recurrent LBP    Time 6    Period Weeks    Status New    Target Date 11/18/19      PT LONG TERM GOAL #4   Title The patient will  be able to sit in a chair for 30 min without R gluteal pain and without compensatory L leaning.    Time 6    Period Weeks    Status New    Target Date 11/18/19      PT LONG TERM GOAL #5   Title Independent in HEP    Time 6    Period Weeks    Status New    Target Date 11/18/19      PT LONG TERM GOAL #6   Title Improve FOTO to </= 30% limitation    Time 6    Period Weeks    Status New    Target Date 11/18/19                  Plan - 10/07/19 1723    Clinical Impression Statement Patient presents with report of recurrent Rt sided LBP with tightness and pain into the Rt posterior hip and hamstrings for the past month with no known cause of symptoms. She has a long standing history of LBP which she manages with exercise and frequent spinal injections. Patient has limited trunk mobility/ROM; (+) slump test Rt; muscular tightness to palpation; limited functional activity level; pain on a daily basis. She will benefit from PT to address problems identified.    Stability/Clinical Decision Making Stable/Uncomplicated    Clinical Decision Making Low    Rehab Potential Good    PT Frequency 2x / week    PT Duration 6 weeks    PT Treatment/Interventions Patient/family education;ADLs/Self Care Home Management;Aquatic Therapy;Cryotherapy;Electrical Stimulation;Iontophoresis 4mg /ml Dexamethasone;Moist Heat;Traction;Ultrasound;Functional mobility training;Therapeutic activities;Therapeutic exercise;Balance training;Neuromuscular re-education;Manual techniques;Dry needling;Taping    PT Next Visit Plan review spine care principles; review HEP; address spinal mobility; teach hip hinge; assess response to DN and manual work and continue as indicated; modalities as indicated    PT Home Exercise Plan DN    Consulted and Agree with Plan of Care  Patient           Patient will benefit from skilled therapeutic intervention in order to improve the following deficits and impairments:  Increased fascial restricitons, Pain, Increased muscle spasms, Hypomobility, Impaired flexibility, Improper body mechanics, Decreased mobility, Decreased strength, Postural dysfunction  Visit Diagnosis: Acute right-sided low back pain with right-sided sciatica - Plan: PT plan of care cert/re-cert  Muscle weakness (generalized) - Plan: PT plan of care cert/re-cert  Other symptoms and signs involving the nervous system - Plan: PT plan of care cert/re-cert  Other symptoms and signs involving the musculoskeletal system - Plan: PT plan of care cert/re-cert     Problem List Patient Active Problem List   Diagnosis Date Noted  . B12 deficiency 02/27/2018  . Lymphadenopathy 12/29/2017  . History of melanoma 12/29/2017  . Radiculitis of right cervical region 12/03/2017  . Stress fracture of metatarsal bone of right foot 05/24/2017  . Asthma, mild persistent 10/04/2015  . Abnormal weight  gain 01/20/2015  . Tachycardia 05/31/2014  . Recurrent UTI 05/06/2014  . DUB (dysfunctional uterine bleeding) 05/06/2014  . Lumbar spondylosis 04/02/2013  . Migraine 02/13/2012  . Major depression, recurrent ([place]) 12/14/2011  . Hypothyroid 11/01/2010  . PCOS (polycystic ovarian syndrome) 11/01/2010  . Chronic right SI joint pain 11/01/2010  . Polyarthritis with left third PIP osteoarthritis 11/01/2010  . Melanoma ([place]) 11/01/2010  . Essential hypertension, benign 11/01/2010  . ANEMIA, IRON DEFICIENCY 10/17/2010  . INSOMNIA, CHRONIC 10/17/2010  . FATIGUE 10/17/2010  . EDEMA 10/17/2010    Bayani Renteria Nilda Simmer PT, MPH  10/07/2019, 5:34 PM  [facility] [place], [place], [address] Phone: [phone]   Fax:  [phone]  Name: Carly [name] MRN: [id number] Date of Birth: Dec 21, 1970

## 2019-10-07 NOTE — Patient Instructions (Signed)

## 2019-10-10 ENCOUNTER — Other Ambulatory Visit: Payer: Self-pay | Admitting: Family Medicine

## 2019-10-10 ENCOUNTER — Other Ambulatory Visit: Payer: Self-pay | Admitting: Sports Medicine

## 2019-10-10 DIAGNOSIS — M47816 Spondylosis without myelopathy or radiculopathy, lumbar region: Secondary | ICD-10-CM

## 2019-10-14 ENCOUNTER — Ambulatory Visit (INDEPENDENT_AMBULATORY_CARE_PROVIDER_SITE_OTHER): Payer: BC Managed Care – PPO | Admitting: Rehabilitative and Restorative Service Providers"

## 2019-10-14 ENCOUNTER — Other Ambulatory Visit: Payer: Self-pay

## 2019-10-14 ENCOUNTER — Encounter: Payer: Self-pay | Admitting: Rehabilitative and Restorative Service Providers"

## 2019-10-14 DIAGNOSIS — M6281 Muscle weakness (generalized): Secondary | ICD-10-CM

## 2019-10-14 DIAGNOSIS — M5441 Lumbago with sciatica, right side: Secondary | ICD-10-CM

## 2019-10-14 DIAGNOSIS — R29898 Other symptoms and signs involving the musculoskeletal system: Secondary | ICD-10-CM

## 2019-10-14 DIAGNOSIS — R29818 Other symptoms and signs involving the nervous system: Secondary | ICD-10-CM

## 2019-10-14 NOTE — Therapy (Signed)
[place], [place], [address] Phone: [phone]   Fax:  [phone]  Physical Therapy Treatment  Patient Details  Name: Consepcion [name] MRN: [id number] Date of Birth: [birth year] Referring Provider (PT): Dr Dianah Field    Encounter Date: 10/14/2019   PT End of Session - 10/14/19 1207    Visit Number 2    Number of Visits 12    Date for PT Re-Evaluation 11/18/19    PT Start Time 0803    PT Stop Time 0845    PT Time Calculation (min) 42 min    Activity Tolerance Patient tolerated treatment well;Patient limited by pain    Behavior During Therapy [facility] for tasks assessed/performed           Past Medical History:  Diagnosis Date  . Arthritis   . Hypertension   . PCOS (polycystic ovarian syndrome)   . Thyroid disease   . TIA (transient ischemic attack)     Past Surgical History:  Procedure Laterality Date  . 2 ectopic pregnancies  [id number],[id number]  . lymph nodes    . melanoma removal     RT SIDE  . removal of fallopian tube  1999    There were no vitals filed for this visit.   Subjective Assessment - 10/14/19 0810    Subjective The patient notes R hamstring is tight this morning.  She has modified her home program reducing her time on the peloton and doing strength training.  She limits herself to 30 minutes.    Pertinent History LBP since 2005 treated with injections - injections since 2010; HTN; melenoma    Patient Stated Goals decrease tightness in hamstring    Currently in Pain? Yes    Pain Score 3     Pain Location Buttocks    Pain Orientation Right    Pain Descriptors / Indicators Tightness;Nagging    Pain Type Chronic pain    Pain Onset More than a month ago    Pain Frequency Constant    Aggravating Factors  sitting, intense workouts    Pain Relieving Factors ESI              [facility] PT Assessment - 10/14/19 1208      Assessment   Medical Diagnosis Rt lumbar spondylosis     Referring Provider (PT) Dr Dianah Field     Onset Date/Surgical Date 09/06/19    Hand Dominance Right                         [facility] Adult PT Treatment/Exercise - 10/14/19 0815      Self-Care   Self-Care Other Self-Care Comments    Other Self-Care Comments  hamstring foam rolling, tennis ball under HS in long sitting with ankle flexion/extension      Exercises   Exercises Knee/Hip;Lumbar      Lumbar Exercises: Stretches   Active Hamstring Stretch Right;Left   12 reps   Active Hamstring Stretch Limitations Askling protocol x 12 reps R x 2 sets and 2 sets L    Piriformis Stretch Right;Left;1 rep;30 seconds      Lumbar Exercises: Standing   Functional Squats 10 reps    Functional Squats Limitations askling protocol/ sliders    Other Standing Lumbar Exercises The diver x 5 reps R and L side with good technique      Lumbar Exercises: Prone   Straight Leg Raise 10 reps    Straight Leg Raises Limitations  L side and then added 5 reps with knee flexed      Lumbar Exercises: Quadruped   Madcat/Old Horse 10 reps    Madcat/Old Horse Limitations with cues for segmental mobility/ *motion occurs at lower thoracic spine                       PT Long Term Goals - 10/07/19 1728      PT LONG TERM GOAL #1   Title Decrease LBP and Rt LE pain by 50-75% allowing patient to return to more normal functional activity level    Time 6    Period Weeks    Status New    Target Date 11/18/19      PT LONG TERM GOAL #2   Title Improve trunk mobility to WFL's and pain free    Time 6    Period Weeks    Status New    Target Date 11/18/19      PT LONG TERM GOAL #3   Title Patient to demonstrate core strengthening and stabilization program to decrease risk of recurrent LBP    Time 6    Period Weeks    Status New    Target Date 11/18/19      PT LONG TERM GOAL #4   Title The patient will  be able to sit in a chair for 30 min without R gluteal pain and without compensatory  L leaning.    Time 6    Period Weeks    Status New    Target Date 11/18/19      PT LONG TERM GOAL #5   Title Independent in HEP    Time 6    Period Weeks    Status New    Target Date 11/18/19      PT LONG TERM GOAL #6   Title Improve FOTO to </= 30% limitation    Time 6    Period Weeks    Status New    Target Date 11/18/19                 Plan - 10/14/19 0849    Clinical Impression Statement The patient is performing a variety of home activities including strength training peloton app, foam rolling, stretcing.  She has reduced her time on the peloton due to R glut/HS pain.  During ther ex, she has significant limitation in hip extension.  PT to address as we continue to progress.    Stability/Clinical Decision Making Stable/Uncomplicated    Rehab Potential Good    PT Frequency 2x / week    PT Duration 6 weeks    PT Treatment/Interventions Patient/family education;ADLs/Self Care Home Management;Aquatic Therapy;Cryotherapy;Electrical Stimulation;Iontophoresis 4mg /ml Dexamethasone;Moist Heat;Traction;Ultrasound;Functional mobility training;Therapeutic activities;Therapeutic exercise;Balance training;Neuromuscular re-education;Manual techniques;Dry needling;Taping    PT Next Visit Plan ** add written HEP focusing on HA length (consider askling protocol);  / isolated glut strengthening; review spine care principles; review HEP; address spinal mobility; teach hip hinge; assess response to DN and manual work and continue as indicated; modalities as indicated    PT Home Exercise Plan DN    Consulted and Agree with Plan of Care Patient           Patient will benefit from skilled therapeutic intervention in order to improve the following deficits and impairments:  Increased fascial restricitons, Pain, Increased muscle spasms, Hypomobility, Impaired flexibility, Improper body mechanics, Decreased mobility, Decreased strength, Postural dysfunction  Visit Diagnosis: Acute  right-sided low back pain with right-sided  sciatica  Muscle weakness (generalized)  Other symptoms and signs involving the nervous system  Other symptoms and signs involving the musculoskeletal system     Problem List Patient Active Problem List   Diagnosis Date Noted  . B12 deficiency 02/27/2018  . Lymphadenopathy 12/29/2017  . History of melanoma 12/29/2017  . Radiculitis of right cervical region 12/03/2017  . Stress fracture of metatarsal bone of right foot 05/24/2017  . Asthma, mild persistent 10/04/2015  . Abnormal weight gain 01/20/2015  . Tachycardia 05/31/2014  . Recurrent UTI 05/06/2014  . DUB (dysfunctional uterine bleeding) 05/06/2014  . Lumbar spondylosis 04/02/2013  . Migraine 02/13/2012  . Major depression, recurrent ([place]) 12/14/2011  . Hypothyroid 11/01/2010  . PCOS (polycystic ovarian syndrome) 11/01/2010  . Chronic right SI joint pain 11/01/2010  . Polyarthritis with left third PIP osteoarthritis 11/01/2010  . Melanoma ([place]) 11/01/2010  . Essential hypertension, benign 11/01/2010  . ANEMIA, IRON DEFICIENCY 10/17/2010  . INSOMNIA, CHRONIC 10/17/2010  . FATIGUE 10/17/2010  . EDEMA 10/17/2010    Lunden Mcleish, PT 10/14/2019, 12:13 PM  [facility] [place], [place], [address] Phone: [phone]   Fax:  [phone]  Name: Carly [name] MRN: [id number] Date of Birth: [birth year]

## 2019-10-16 ENCOUNTER — Encounter: Payer: Self-pay | Admitting: Rehabilitative and Restorative Service Providers"

## 2019-10-16 ENCOUNTER — Other Ambulatory Visit: Payer: Self-pay

## 2019-10-16 ENCOUNTER — Ambulatory Visit (INDEPENDENT_AMBULATORY_CARE_PROVIDER_SITE_OTHER): Payer: BC Managed Care – PPO | Admitting: Rehabilitative and Restorative Service Providers"

## 2019-10-16 DIAGNOSIS — R29818 Other symptoms and signs involving the nervous system: Secondary | ICD-10-CM

## 2019-10-16 DIAGNOSIS — M5441 Lumbago with sciatica, right side: Secondary | ICD-10-CM | POA: Diagnosis not present

## 2019-10-16 DIAGNOSIS — M6281 Muscle weakness (generalized): Secondary | ICD-10-CM

## 2019-10-16 DIAGNOSIS — R29898 Other symptoms and signs involving the musculoskeletal system: Secondary | ICD-10-CM

## 2019-10-16 NOTE — Therapy (Signed)
[place], [place], [address] Phone: [phone]   Fax:  [phone]  Physical Therapy Treatment  Patient Details  Name: Carly [name] MRN: [id number] Date of Birth: 1970-07-18 Referring Provider (PT): Dr Dianah Field    Encounter Date: 10/16/2019   PT End of Session - 10/16/19 1257    Visit Number 3    Number of Visits 12    Date for PT Re-Evaluation 11/18/19    PT Start Time 0800    PT Stop Time 0852    PT Time Calculation (min) 52 min    Activity Tolerance Patient tolerated treatment well;Patient limited by pain           Past Medical History:  Diagnosis Date   Arthritis    Hypertension    PCOS (polycystic ovarian syndrome)    Thyroid disease    TIA (transient ischemic attack)     Past Surgical History:  Procedure Laterality Date   2 ectopic pregnancies  3419,3790   lymph nodes     melanoma removal     RT SIDE   removal of fallopian tube  1999    There were no vitals filed for this visit.   Subjective Assessment - 10/16/19 0810    Subjective Pain continues in the tightness in the Rt hamstrings. LBP persists    Currently in Pain? Yes    Pain Score 3     Pain Location Buttocks    Pain Orientation Right                             [facility] Adult PT Treatment/Exercise - 10/16/19 0001      Lumbar Exercises: Seated   Other Seated Lumbar Exercises ant/post pelvic tilt on dynadisc and on table x 10-15 each       Lumbar Exercises: Quadruped   Madcat/Old Horse 10 reps   2 sets 5-10 sec hold    Madcat/Old Horse Limitations working on segmental mobility - difficulty with anterior pelvic tilt       Moist Heat Therapy   Number Minutes Moist Heat 12 Minutes    Moist Heat Location Lumbar Spine;Hip   hamstrings Rt      Manual Therapy   Manual therapy comments skilled palpation to assess soft tissue response to DN amd manual therapy     Joint Mobilization  PA mobs lumbar spine Grade II/III    Soft tissue mobilization deep tissue work through the bilat lumbar and posterior hip/buttocks into Rt hamstrings             Trigger Point Dry Needling - 10/16/19 0001    Consent Given? Yes    Education Handout Provided Previously provided    Other Dry Needling Rt HS bilat lumbar spine     Hamstring Response Palpable increased muscle length    Lumbar multifidi Response Palpable increased muscle length    Quadratus Lumborum Response Palpable increased muscle length                     PT Long Term Goals - 10/07/19 1728      PT LONG TERM GOAL #1   Title Decrease LBP and Rt LE pain by 50-75% allowing patient to return to more normal functional activity level    Time 6    Period Weeks    Status New    Target Date 11/18/19      PT LONG  TERM GOAL #2   Title Improve trunk mobility to WFL's and pain free    Time 6    Period Weeks    Status New    Target Date 11/18/19      PT LONG TERM GOAL #3   Title Patient to demonstrate core strengthening and stabilization program to decrease risk of recurrent LBP    Time 6    Period Weeks    Status New    Target Date 11/18/19      PT LONG TERM GOAL #4   Title The patient will  be able to sit in a chair for 30 min without R gluteal pain and without compensatory L leaning.    Time 6    Period Weeks    Status New    Target Date 11/18/19      PT LONG TERM GOAL #5   Title Independent in HEP    Time 6    Period Weeks    Status New    Target Date 11/18/19      PT LONG TERM GOAL #6   Title Improve FOTO to </= 30% limitation    Time 6    Period Weeks    Status New    Target Date 11/18/19                 Plan - 10/16/19 0846    Clinical Impression Statement Continued education re- activity level; exercise; treatment. Trial of quadruped and seatd work on segmental mobilty/ROM. Patient has limited anterior tilt in quadruped and sitting. Reproduction of Rt hamstring pain with  anterior tilt in sitting. Trial of DN to QL bilat and Rt posterior hip.    Rehab Potential Good    PT Frequency 2x / week    PT Duration 6 weeks    PT Treatment/Interventions Patient/family education;ADLs/Self Care Home Management;Aquatic Therapy;Cryotherapy;Electrical Stimulation;Iontophoresis 4mg /ml Dexamethasone;Moist Heat;Traction;Ultrasound;Functional mobility training;Therapeutic activities;Therapeutic exercise;Balance training;Neuromuscular re-education;Manual techniques;Dry needling;Taping    PT Next Visit Plan ** add written HEP focusing on HA length (consider askling protocol);  / isolated glut strengthening; review spine care principles; review HEP; address spinal mobility; teach hip hinge; assess response to DN(bilat QL and Rt posterior hip) and manual work and continue as indicated; modalities as indicated    PT [place] and Agree with Plan of Care Patient           Patient will benefit from skilled therapeutic intervention in order to improve the following deficits and impairments:     Visit Diagnosis: Acute right-sided low back pain with right-sided sciatica  Muscle weakness (generalized)  Other symptoms and signs involving the nervous system  Other symptoms and signs involving the musculoskeletal system     Problem List Patient Active Problem List   Diagnosis Date Noted   B12 deficiency 02/27/2018   Lymphadenopathy 12/29/2017   History of melanoma 12/29/2017   Radiculitis of right cervical region 12/03/2017   Stress fracture of metatarsal bone of right foot 05/24/2017   Asthma, mild persistent 10/04/2015   Abnormal weight gain 01/20/2015   Tachycardia 05/31/2014   Recurrent UTI 05/06/2014   DUB (dysfunctional uterine bleeding) 05/06/2014   Lumbar spondylosis 04/02/2013   Migraine 02/13/2012   Major depression, recurrent ([place]) 12/14/2011   Hypothyroid 11/01/2010   PCOS (polycystic ovarian syndrome) 11/01/2010     Chronic right SI joint pain 11/01/2010   Polyarthritis with left third PIP osteoarthritis 11/01/2010   Melanoma ([place]) 11/01/2010   Essential hypertension,  benign 11/01/2010   ANEMIA, IRON DEFICIENCY 10/17/2010   INSOMNIA, CHRONIC 10/17/2010   FATIGUE 10/17/2010   EDEMA 10/17/2010    Nada Godley Nilda Simmer PT, MPH  10/16/2019, 12:57 PM  [facility] [place], [place], [address] Phone: [phone]   Fax:  [phone]  Name: Carly [name] MRN: [id number] Date of Birth: 01-Apr-1970

## 2019-10-16 NOTE — Patient Instructions (Signed)
Access Code: [email or web address]: [email or web address]: 09/24/2021Prepared by: Landy Dunnavant HoltExercises  Cat-Camel - 2 x daily - 7 x weekly - 1 sets - 5-10 reps - 5-10 sec hold

## 2019-10-19 ENCOUNTER — Other Ambulatory Visit: Payer: Self-pay

## 2019-10-19 ENCOUNTER — Ambulatory Visit (INDEPENDENT_AMBULATORY_CARE_PROVIDER_SITE_OTHER): Payer: BC Managed Care – PPO | Admitting: Rehabilitative and Restorative Service Providers"

## 2019-10-19 ENCOUNTER — Encounter: Payer: Self-pay | Admitting: Rehabilitative and Restorative Service Providers"

## 2019-10-19 DIAGNOSIS — M6281 Muscle weakness (generalized): Secondary | ICD-10-CM | POA: Diagnosis not present

## 2019-10-19 DIAGNOSIS — M5441 Lumbago with sciatica, right side: Secondary | ICD-10-CM

## 2019-10-19 DIAGNOSIS — R29898 Other symptoms and signs involving the musculoskeletal system: Secondary | ICD-10-CM

## 2019-10-19 DIAGNOSIS — R29818 Other symptoms and signs involving the nervous system: Secondary | ICD-10-CM

## 2019-10-19 NOTE — Therapy (Signed)
[place], [place], [address] Phone: [phone]   Fax:  [phone]  Physical Therapy Treatment  Patient Details  Name: Carly [name] MRN: [id number] Date of Birth: Jul 09, 1970 Referring Provider (PT): Dr Dianah Field    Encounter Date: 10/19/2019   PT End of Session - 10/19/19 1606    Visit Number 4    Number of Visits 12    Date for PT Re-Evaluation 11/18/19    PT Start Time 5053    PT Stop Time 1654    PT Time Calculation (min) 49 min    Activity Tolerance Patient tolerated treatment well;Patient limited by pain           Past Medical History:  Diagnosis Date  . Arthritis   . Hypertension   . PCOS (polycystic ovarian syndrome)   . Thyroid disease   . TIA (transient ischemic attack)     Past Surgical History:  Procedure Laterality Date  . 2 ectopic pregnancies  9767,3419  . lymph nodes    . melanoma removal     RT SIDE  . removal of fallopian tube  1999    There were no vitals filed for this visit.   Subjective Assessment - 10/19/19 1652    Subjective Very sore the day of DN through the lumbar area - but then noticed improved mobility through the lumbar spine. No change in HS pain and tightness    Currently in Pain? Yes    Pain Score 4     Pain Location Buttocks    Pain Orientation Right    Pain Descriptors / Indicators Tightness;Nagging    Pain Type Chronic pain    Pain Radiating Towards posterior hip to posterior thigh    Pain Onset More than a month ago                             [facility] Adult PT Treatment/Exercise - 10/19/19 0001      Lumbar Exercises: Standing   Other Standing Lumbar Exercises back and hips to wall slowly rolling forward one spinal level at a time hold at end range x ~10 sec then return to stand       Lumbar Exercises: Seated   Other Seated Lumbar Exercises ant/post pelvic tilt on dynadisc and on table x 10-15 each        Lumbar Exercises: Quadruped   Madcat/Old Horse 10 reps   2 sets 5-10 sec hold; 1 set with red TB resistance for cat    Madcat/Old Horse Limitations * sign rechecking ROM following manual work and exercises -       Manual Therapy   Manual therapy comments skilled palpation to assess soft tissue response to DN amd manual therapy     Joint Mobilization PA mobs lumbar spine Grade II/III    Soft tissue mobilization deep tissue work through the Rt > Lt lumbar and posterior hip/buttocks into Rt hamstrings     Myofascial Release Rt lumbar to posterior hip             Trigger Point Dry Needling - 10/19/19 0001    Consent Given? Yes    Education Handout Provided Previously provided    Other Dry Needling Rt lumbar     Lumbar multifidi Response Palpable increased muscle length    Quadratus Lumborum Response Palpable increased muscle length  PT Long Term Goals - 10/07/19 1728      PT LONG TERM GOAL #1   Title Decrease LBP and Rt LE pain by 50-75% allowing patient to return to more normal functional activity level    Time 6    Period Weeks    Status New    Target Date 11/18/19      PT LONG TERM GOAL #2   Title Improve trunk mobility to WFL's and pain free    Time 6    Period Weeks    Status New    Target Date 11/18/19      PT LONG TERM GOAL #3   Title Patient to demonstrate core strengthening and stabilization program to decrease risk of recurrent LBP    Time 6    Period Weeks    Status New    Target Date 11/18/19      PT LONG TERM GOAL #4   Title The patient will  be able to sit in a chair for 30 min without R gluteal pain and without compensatory L leaning.    Time 6    Period Weeks    Status New    Target Date 11/18/19      PT LONG TERM GOAL #5   Title Independent in HEP    Time 6    Period Weeks    Status New    Target Date 11/18/19      PT LONG TERM GOAL #6   Title Improve FOTO to </= 30% limitation    Time 6    Period Weeks     Status New    Target Date 11/18/19                 Plan - 10/19/19 1633    Clinical Impression Statement Neuromuscular re-education focus on segmental lumbar mobility. Added quadruped cat cow with TB resistance for cat. Added segmental spinal flexion back to wall preventing hip flexion and isolating lumbar flexion. Continued with DN and manual work focus on QL and lumbar musculature Rt lower quarter.    Rehab Potential Good    PT Frequency 2x / week    PT Duration 6 weeks    PT Treatment/Interventions Patient/family education;ADLs/Self Care Home Management;Aquatic Therapy;Cryotherapy;Electrical Stimulation;Iontophoresis 4mg /ml Dexamethasone;Moist Heat;Traction;Ultrasound;Functional mobility training;Therapeutic activities;Therapeutic exercise;Balance training;Neuromuscular re-education;Manual techniques;Dry needling;Taping    PT Next Visit Plan ** add written HEP focusing on HA length (consider askling protocol);  / isolated glut strengthening; review spine care principles; review HEP; address spinal mobility; teach hip hinge; assess response to DN(bilat QL and Rt posterior hip) and manual work and continue as indicated; modalities as indicated    PT [place] and Agree with Plan of Care Patient           Patient will benefit from skilled therapeutic intervention in order to improve the following deficits and impairments:     Visit Diagnosis: Acute right-sided low back pain with right-sided sciatica  Muscle weakness (generalized)  Other symptoms and signs involving the nervous system  Other symptoms and signs involving the musculoskeletal system     Problem List Patient Active Problem List   Diagnosis Date Noted  . B12 deficiency 02/27/2018  . Lymphadenopathy 12/29/2017  . History of melanoma 12/29/2017  . Radiculitis of right cervical region 12/03/2017  . Stress fracture of metatarsal bone of right foot 05/24/2017  . Asthma, mild  persistent 10/04/2015  . Abnormal weight gain 01/20/2015  . Tachycardia 05/31/2014  .  Recurrent UTI 05/06/2014  . DUB (dysfunctional uterine bleeding) 05/06/2014  . Lumbar spondylosis 04/02/2013  . Migraine 02/13/2012  . Major depression, recurrent ([place]) 12/14/2011  . Hypothyroid 11/01/2010  . PCOS (polycystic ovarian syndrome) 11/01/2010  . Chronic right SI joint pain 11/01/2010  . Polyarthritis with left third PIP osteoarthritis 11/01/2010  . Melanoma ([place]) 11/01/2010  . Essential hypertension, benign 11/01/2010  . ANEMIA, IRON DEFICIENCY 10/17/2010  . INSOMNIA, CHRONIC 10/17/2010  . FATIGUE 10/17/2010  . EDEMA 10/17/2010    Jonanthan Bolender Nilda Simmer PT, MPH  10/19/2019, 5:00 PM  [facility] [place], [place], [address] Phone: [phone]   Fax:  [phone]  Name: Annella Prowell MRN: [id number] Date of Birth: March 01, 1970

## 2019-10-23 ENCOUNTER — Ambulatory Visit (INDEPENDENT_AMBULATORY_CARE_PROVIDER_SITE_OTHER): Payer: BC Managed Care – PPO | Admitting: Rehabilitative and Restorative Service Providers"

## 2019-10-23 ENCOUNTER — Other Ambulatory Visit: Payer: Self-pay

## 2019-10-23 DIAGNOSIS — M6281 Muscle weakness (generalized): Secondary | ICD-10-CM | POA: Diagnosis not present

## 2019-10-23 DIAGNOSIS — M5441 Lumbago with sciatica, right side: Secondary | ICD-10-CM | POA: Diagnosis not present

## 2019-10-23 DIAGNOSIS — R29818 Other symptoms and signs involving the nervous system: Secondary | ICD-10-CM | POA: Diagnosis not present

## 2019-10-23 DIAGNOSIS — R29898 Other symptoms and signs involving the musculoskeletal system: Secondary | ICD-10-CM

## 2019-10-23 NOTE — Therapy (Signed)
[place], [place], [address] Phone: [phone]   Fax:  [phone]  Physical Therapy Treatment  Patient Details  Name: Carly [name] MRN: [id number] Date of Birth: Mar 08, 1970 Referring Provider (PT): Dr Dianah Field    Encounter Date: 10/23/2019   PT End of Session - 10/23/19 1023    Visit Number 5    Number of Visits 12    Date for PT Re-Evaluation 11/18/19    PT Start Time 0109    PT Stop Time 1104    PT Time Calculation (min) 49 min    Activity Tolerance Patient tolerated treatment well           Past Medical History:  Diagnosis Date  . Arthritis   . Hypertension   . PCOS (polycystic ovarian syndrome)   . Thyroid disease   . TIA (transient ischemic attack)     Past Surgical History:  Procedure Laterality Date  . 2 ectopic pregnancies  3235,5732  . lymph nodes    . melanoma removal     RT SIDE  . removal of fallopian tube  1999    There were no vitals filed for this visit.   Subjective Assessment - 10/23/19 1030    Subjective HS pain is a little better. Not as intense. Working on her exercises at home.    Currently in Pain? Yes    Pain Score 3     Pain Location Buttocks    Pain Orientation Right    Pain Descriptors / Indicators Tightness;Nagging    Pain Type Chronic pain              [facility] PT Assessment - 10/23/19 0001      Assessment   Medical Diagnosis Rt lumbar spondylosis    Referring Provider (PT) Dr Dianah Field     Onset Date/Surgical Date 09/06/19    Hand Dominance Right      Posture/Postural Control   Posture Comments improved symmetry in sitting and standing       AROM   Overall AROM Comments improving segmental mobilty through the lumbar spine - continued tightness in anterior pelvic tilt with this position producing the pain into the Rt HS       Palpation   Palpation comment muscular tightness through the Rt lumbar  spine/lats/QL/piriformis/gluts/hamstrings                          [facility] Adult PT Treatment/Exercise - 10/23/19 0001      Lumbar Exercises: Standing   Other Standing Lumbar Exercises back and hips to wall slowly rolling forward one spinal level at a time hold at end range x ~10 sec then return to stand       Lumbar Exercises: Seated   Other Seated Lumbar Exercises ant/post pelvic tilt on dynadisc and on table x 10-15 each       Lumbar Exercises: Quadruped   Madcat/Old Horse 10 reps   2 sets 5-10 sec hold; 1 set with red TB resistance for cat    Madcat/Old Horse Limitations * sign rechecking ROM following manual work and exercises -       Moist Heat Therapy   Number Minutes Moist Heat 10 Minutes    Moist Heat Location Lumbar Spine;Hip   Rt posterior thigh/HS      Manual Therapy   Manual therapy comments skilled palpation to assess soft tissue response to DN amd manual therapy  Joint Mobilization PA mobs lumbar spine Grade II/III    Soft tissue mobilization deep tissue work through the Rt > Lt lumbar and posterior hip/buttocks into Rt hamstrings     Myofascial Release Rt lumbar to posterior hip             Trigger Point Dry Needling - 10/23/19 0001    Consent Given? Yes    Education Handout Provided Previously provided    Other Dry Needling Rt/Lt lumbar     Gluteus Medius Response Palpable increased muscle length    Gluteus Maximus Response Palpable increased muscle length    Piriformis Response Palpable increased muscle length    Lumbar multifidi Response Palpable increased muscle length    Quadratus Lumborum Response Palpable increased muscle length                     PT Long Term Goals - 10/07/19 1728      PT LONG TERM GOAL #1   Title Decrease LBP and Rt LE pain by 50-75% allowing patient to return to more normal functional activity level    Time 6    Period Weeks    Status New    Target Date 11/18/19      PT LONG TERM GOAL #2   Title  Improve trunk mobility to WFL's and pain free    Time 6    Period Weeks    Status New    Target Date 11/18/19      PT LONG TERM GOAL #3   Title Patient to demonstrate core strengthening and stabilization program to decrease risk of recurrent LBP    Time 6    Period Weeks    Status New    Target Date 11/18/19      PT LONG TERM GOAL #4   Title The patient will  be able to sit in a chair for 30 min without R gluteal pain and without compensatory L leaning.    Time 6    Period Weeks    Status New    Target Date 11/18/19      PT LONG TERM GOAL #5   Title Independent in HEP    Time 6    Period Weeks    Status New    Target Date 11/18/19      PT LONG TERM GOAL #6   Title Improve FOTO to </= 30% limitation    Time 6    Period Weeks    Status New    Target Date 11/18/19                 Plan - 10/23/19 1059    Clinical Impression Statement Patient reports decreased intensity of HS pain. Continued to work through the Rt/Lt QL; Rt lumbar paraspinals, pluts, piriformis with DN and manual work. Working on Brewing technologist focus on segmental lumbar mobilty. Improving lumbar mobility. Making some progress with radicular c/o's    Rehab Potential Good    PT Frequency 2x / week    PT Duration 6 weeks    PT Treatment/Interventions Patient/family education;ADLs/Self Care Home Management;Aquatic Therapy;Cryotherapy;Electrical Stimulation;Iontophoresis 4mg /ml Dexamethasone;Moist Heat;Traction;Ultrasound;Functional mobility training;Therapeutic activities;Therapeutic exercise;Balance training;Neuromuscular re-education;Manual techniques;Dry needling;Taping    PT Next Visit Plan ** add written HEP focusing on HA length (consider askling protocol);  / isolated glut strengthening; review spine care principles; review HEP; address spinal mobility; teach hip hinge; assess response to DN(bilat QL and Rt posterior hip) and manual work and continue as indicated; modalities as  indicated     PT Home Exercise Plan [email or web address]    Consulted and Agree with Plan of Care Patient           Patient will benefit from skilled therapeutic intervention in order to improve the following deficits and impairments:     Visit Diagnosis: Acute right-sided low back pain with right-sided sciatica  Muscle weakness (generalized)  Other symptoms and signs involving the nervous system  Other symptoms and signs involving the musculoskeletal system     Problem List Patient Active Problem List   Diagnosis Date Noted  . B12 deficiency 02/27/2018  . Lymphadenopathy 12/29/2017  . History of melanoma 12/29/2017  . Radiculitis of right cervical region 12/03/2017  . Stress fracture of metatarsal bone of right foot 05/24/2017  . Asthma, mild persistent 10/04/2015  . Abnormal weight gain 01/20/2015  . Tachycardia 05/31/2014  . Recurrent UTI 05/06/2014  . DUB (dysfunctional uterine bleeding) 05/06/2014  . Lumbar spondylosis 04/02/2013  . Migraine 02/13/2012  . Major depression, recurrent ([place]) 12/14/2011  . Hypothyroid 11/01/2010  . PCOS (polycystic ovarian syndrome) 11/01/2010  . Chronic right SI joint pain 11/01/2010  . Polyarthritis with left third PIP osteoarthritis 11/01/2010  . Melanoma ([place]) 11/01/2010  . Essential hypertension, benign 11/01/2010  . ANEMIA, IRON DEFICIENCY 10/17/2010  . INSOMNIA, CHRONIC 10/17/2010  . FATIGUE 10/17/2010  . EDEMA 10/17/2010    Chrys Landgrebe Nilda Simmer PT, MPH  10/23/2019, 11:02 AM  [facility] [place], [place], [address] Phone: [phone]   Fax:  [phone]  Name: Jalyne Brodzinski MRN: [id number] Date of Birth: Mar 11, 1970

## 2019-10-26 ENCOUNTER — Ambulatory Visit (INDEPENDENT_AMBULATORY_CARE_PROVIDER_SITE_OTHER): Payer: BC Managed Care – PPO | Admitting: Rehabilitative and Restorative Service Providers"

## 2019-10-26 ENCOUNTER — Other Ambulatory Visit: Payer: Self-pay

## 2019-10-26 ENCOUNTER — Encounter: Payer: Self-pay | Admitting: Rehabilitative and Restorative Service Providers"

## 2019-10-26 DIAGNOSIS — M5441 Lumbago with sciatica, right side: Secondary | ICD-10-CM | POA: Diagnosis not present

## 2019-10-26 DIAGNOSIS — M6281 Muscle weakness (generalized): Secondary | ICD-10-CM

## 2019-10-26 DIAGNOSIS — R29898 Other symptoms and signs involving the musculoskeletal system: Secondary | ICD-10-CM

## 2019-10-26 DIAGNOSIS — H0288A Meibomian gland dysfunction right eye, upper and lower eyelids: Secondary | ICD-10-CM | POA: Diagnosis not present

## 2019-10-26 DIAGNOSIS — M5440 Lumbago with sciatica, unspecified side: Secondary | ICD-10-CM

## 2019-10-26 DIAGNOSIS — H0288B Meibomian gland dysfunction left eye, upper and lower eyelids: Secondary | ICD-10-CM | POA: Diagnosis not present

## 2019-10-26 DIAGNOSIS — R29818 Other symptoms and signs involving the nervous system: Secondary | ICD-10-CM | POA: Diagnosis not present

## 2019-10-26 NOTE — Therapy (Signed)
[facility] [place], [place], [address] Phone: [phone]   Fax:  [phone]  Physical Therapy Treatment  Patient Details  Name: Carly [name] MRN: [id number] Date of Birth: [birth year] Referring Provider (PT): Dr Dianah Field    Encounter Date: 10/26/2019   PT End of Session - 10/26/19 0730    Visit Number 6    Number of Visits 12    Date for PT Re-Evaluation 11/18/19    PT Start Time 0715    PT Stop Time 0803    PT Time Calculation (min) 48 min    Activity Tolerance Patient tolerated treatment well           Past Medical History:  Diagnosis Date  . Arthritis   . Hypertension   . PCOS (polycystic ovarian syndrome)   . Thyroid disease   . TIA (transient ischemic attack)     Past Surgical History:  Procedure Laterality Date  . 2 ectopic pregnancies  6962,9528  . lymph nodes    . melanoma removal     RT SIDE  . removal of fallopian tube  1999    There were no vitals filed for this visit.   Subjective Assessment - 10/26/19 0730    Subjective Some nagging pain in the posterior hip/butt. Aching in the LB with DN - increased sensitivity with DN in the LB today. No porblem with camping and even horseback riding this past weekend Feels we are moving in the right direction.    Currently in Pain? Yes    Pain Score 3     Pain Location Buttocks                             [facility] Adult PT Treatment/Exercise - 10/26/19 0001      Lumbar Exercises: Stretches   Active Hamstring Stretch Right   nerve glide x 10~ 1-2 sec hip flexed to 90 deg knee flex/ext   Press Ups 5 reps;5 seconds;10 seconds    Press Ups Limitations working on segmental extension with PT pressure through the lumbar spine to increase segmental extension       Lumbar Exercises: Standing   Other Standing Lumbar Exercises back and hips to wall slowly rolling forward one spinal level at a time hold at end range x ~10  sec then return to stand       Lumbar Exercises: Seated   Other Seated Lumbar Exercises ant/post pelvic tilt on dynadisc and on table x 10-15 each       Lumbar Exercises: Quadruped   Madcat/Old Horse 10 reps   2 sets 5-10 sec hold; 1 set with red TB resistance for cat    Madcat/Old Horse Limitations * sign rechecking ROM following manual work and exercises -       Moist Heat Therapy   Number Minutes Moist Heat 10 Minutes    Moist Heat Location Lumbar Spine;Hip   Rt posterior thigh/HS      Manual Therapy   Manual therapy comments skilled palpation to assess soft tissue response to DN amd manual therapy     Joint Mobilization PA mobs lumbar spine Grade II/III    Soft tissue mobilization deep tissue work through the Rt > Lt lumbar and posterior hip/buttocks into Rt hamstrings     Myofascial Release Rt lumbar to posterior hip     Passive ROM IR/ER Rt hip pt prone hip extended; knee flexed to 90 deg  Trigger Point Dry Needling - 10/26/19 0001    Consent Given? Yes    Education Handout Provided Previously provided    Other Dry Needling Rt/Lt lumbar     Gluteus Medius Response Palpable increased muscle length    Gluteus Maximus Response Palpable increased muscle length    Piriformis Response Palpable increased muscle length    Lumbar multifidi Response Palpable increased muscle length    Quadratus Lumborum Response Palpable increased muscle length                PT Education - 10/26/19 0752    Education Details HEP    Person(s) Educated Patient    Methods Explanation;Demonstration;Tactile cues;Verbal cues;Handout    Comprehension Verbalized understanding;Returned demonstration;Verbal cues required;Tactile cues required               PT Long Term Goals - 10/07/19 1728      PT LONG TERM GOAL #1   Title Decrease LBP and Rt LE pain by 50-75% allowing patient to return to more normal functional activity level    Time 6    Period Weeks    Status New     Target Date 11/18/19      PT LONG TERM GOAL #2   Title Improve trunk mobility to WFL's and pain free    Time 6    Period Weeks    Status New    Target Date 11/18/19      PT LONG TERM GOAL #3   Title Patient to demonstrate core strengthening and stabilization program to decrease risk of recurrent LBP    Time 6    Period Weeks    Status New    Target Date 11/18/19      PT LONG TERM GOAL #4   Title The patient will  be able to sit in a chair for 30 min without R gluteal pain and without compensatory L leaning.    Time 6    Period Weeks    Status New    Target Date 11/18/19      PT LONG TERM GOAL #5   Title Independent in HEP    Time 6    Period Weeks    Status New    Target Date 11/18/19      PT LONG TERM GOAL #6   Title Improve FOTO to </= 30% limitation    Time 6    Period Weeks    Status New    Target Date 11/18/19                 Plan - 10/26/19 0753    Clinical Impression Statement Continued gradual improvement. patient continues to have limitation in segmental lumbar extension. Note improved mobility post manual work and DN with incresaed sag with cat cow. Added nerve glide in supine. Encouraged continued work on lumbar extension.    Rehab Potential Good    PT Frequency 2x / week    PT Duration 6 weeks    PT Treatment/Interventions Patient/family education;ADLs/Self Care Home Management;Aquatic Therapy;Cryotherapy;Electrical Stimulation;Iontophoresis 4mg /ml Dexamethasone;Moist Heat;Traction;Ultrasound;Functional mobility training;Therapeutic activities;Therapeutic exercise;Balance training;Neuromuscular re-education;Manual techniques;Dry needling;Taping    PT Next Visit Plan ** add written HEP focusing on HA length (consider askling protocol);  / isolated glut strengthening; review spine care principles; review HEP; address spinal mobility; teach hip hinge; assess response to DN(bilat QL and Rt posterior hip) and manual work and continue as indicated; modalities  as indicated    [place]ia  Consulted and Agree with Plan of Care Patient           Patient will benefit from skilled therapeutic intervention in order to improve the following deficits and impairments:     Visit Diagnosis: Acute right-sided low back pain with right-sided sciatica  Muscle weakness (generalized)  Other symptoms and signs involving the nervous system  Other symptoms and signs involving the musculoskeletal system  Acute right-sided low back pain with sciatica, sciatica laterality unspecified     Problem List Patient Active Problem List   Diagnosis Date Noted  . B12 deficiency 02/27/2018  . Lymphadenopathy 12/29/2017  . History of melanoma 12/29/2017  . Radiculitis of right cervical region 12/03/2017  . Stress fracture of metatarsal bone of right foot 05/24/2017  . Asthma, mild persistent 10/04/2015  . Abnormal weight gain 01/20/2015  . Tachycardia 05/31/2014  . Recurrent UTI 05/06/2014  . DUB (dysfunctional uterine bleeding) 05/06/2014  . Lumbar spondylosis 04/02/2013  . Migraine 02/13/2012  . Major depression, recurrent ([place]) 12/14/2011  . Hypothyroid 11/01/2010  . PCOS (polycystic ovarian syndrome) 11/01/2010  . Chronic right SI joint pain 11/01/2010  . Polyarthritis with left third PIP osteoarthritis 11/01/2010  . Melanoma ([place]) 11/01/2010  . Essential hypertension, benign 11/01/2010  . ANEMIA, IRON DEFICIENCY 10/17/2010  . INSOMNIA, CHRONIC 10/17/2010  . FATIGUE 10/17/2010  . EDEMA 10/17/2010    Keyaira Clapham Nilda Simmer PT, MPH  10/26/2019, 7:58 AM  [facility] [place], [place], [address] Phone: [phone]   Fax:  [phone]  Name: Carly [name] MRN: [id number] Date of Birth: [birth year]

## 2019-10-26 NOTE — Patient Instructions (Addendum)
Nerve glide supine  Access Code: [email or web address]: [email or web address]: 10/04/2021Prepared by: Stephanne Greeley HoltExercises  Cat-Camel - 2 x daily - 7 x weekly - 1 sets - 5-10 reps - 5-10 sec hold  Supine Sciatic Nerve Glide - 2 x daily - 7 x weekly - 1 sets - 10-15 reps - 1-2 sec hold  Straight Leg Raise Nerve Flossing - 2 x daily - 7 x weekly - 1 sets - 10-15 reps - 1-2 sec hold

## 2019-10-29 ENCOUNTER — Encounter: Payer: Self-pay | Admitting: Rehabilitative and Restorative Service Providers"

## 2019-10-29 ENCOUNTER — Other Ambulatory Visit: Payer: Self-pay

## 2019-10-29 ENCOUNTER — Ambulatory Visit (INDEPENDENT_AMBULATORY_CARE_PROVIDER_SITE_OTHER): Payer: BC Managed Care – PPO | Admitting: Rehabilitative and Restorative Service Providers"

## 2019-10-29 DIAGNOSIS — R29818 Other symptoms and signs involving the nervous system: Secondary | ICD-10-CM | POA: Diagnosis not present

## 2019-10-29 DIAGNOSIS — M6281 Muscle weakness (generalized): Secondary | ICD-10-CM

## 2019-10-29 DIAGNOSIS — R29898 Other symptoms and signs involving the musculoskeletal system: Secondary | ICD-10-CM | POA: Diagnosis not present

## 2019-10-29 DIAGNOSIS — M5441 Lumbago with sciatica, right side: Secondary | ICD-10-CM | POA: Diagnosis not present

## 2019-10-29 NOTE — Therapy (Signed)
[place], [place], [address] Phone: [phone]   Fax:  [phone]  Physical Therapy Treatment  Patient Details  Name: Carly [name] MRN: [id number] Date of Birth: 28-Apr-1970 Referring Provider (PT): Dr Dianah Field    Encounter Date: 10/29/2019   PT End of Session - 10/29/19 1833    Visit Number 7    Number of Visits 12    Date for PT Re-Evaluation 11/18/19    PT Start Time 1622    PT Stop Time 1710    PT Time Calculation (min) 48 min    Activity Tolerance Patient tolerated treatment well           Past Medical History:  Diagnosis Date  . Arthritis   . Hypertension   . PCOS (polycystic ovarian syndrome)   . Thyroid disease   . TIA (transient ischemic attack)     Past Surgical History:  Procedure Laterality Date  . 2 ectopic pregnancies  6389,3734  . lymph nodes    . melanoma removal     RT SIDE  . removal of fallopian tube  1999    There were no vitals filed for this visit.   Subjective Assessment - 10/29/19 1625    Subjective The patient reports she is hurting down the back of her leg more today and thinks it is because she sat more.    Pertinent History LBP since 2005 treated with injections - injections since 2010; HTN; melenoma    Patient Stated Goals decrease tightness in hamstring    Currently in Pain? Yes    Pain Score 5     Pain Location Buttocks    Pain Orientation Right    Pain Descriptors / Indicators Tightness;Nagging    Pain Type Chronic pain    Pain Onset More than a month ago    Pain Frequency Constant    Aggravating Factors  sitting long periods    Pain Relieving Factors injections, therapy helping              [facility] PT Assessment - 10/29/19 1629      Assessment   Medical Diagnosis Rt lumbar spondylosis    Referring Provider (PT) Dr Dianah Field     Onset Date/Surgical Date 09/06/19    Hand Dominance Right                          [facility] Adult PT Treatment/Exercise - 10/29/19 1631      Self-Care   Self-Care Other Self-Care Comments    Other Self-Care Comments  discussed continuation of foam rolling, tennis all on wall and home stretching; patient continues to reduce use of peloton bike.      Exercises   Exercises Knee/Hip;Lumbar      Lumbar Exercises: Stretches   Hip Flexor Stretch Left;1 rep;30 seconds    Piriformis Stretch Right;Left;1 rep;30 seconds    Piriformis Stretch Limitations seated edge of mat      Lumbar Exercises: Supine   Bridge 10 reps    Single Leg Bridge 10 reps    Bridge with Ball Squeeze Limitations with contralateral LE extended, then on physioball    Other Supine Lumbar Exercises Thomas test position with L leg extended and performing end range isometric hip extension x 5 reps      Lumbar Exercises: Quadruped   Madcat/Old Horse 10 reps    Madcat/Old Horse Limitations --    Straight Leg Raise 10 reps  Straight Leg Raises Limitations with cues to reduce trunk rotation when performing L hip extension    Opposite Arm/Leg Raise 5 reps;Right arm/Left leg;Left arm/Right leg    Other Quadruped Lumbar Exercises segmental mobility noted in q-ped with improvement compared to prior sessions      Manual Therapy   Manual Therapy Joint mobilization    Manual therapy comments to improve mobility low back and SI region    Joint Mobilization PA mobs lumbar spine Grade II, R SI compression PA mobs grade II-III     Soft tissue mobilization --                       PT Long Term Goals - 10/07/19 1728      PT LONG TERM GOAL #1   Title Decrease LBP and Rt LE pain by 50-75% allowing patient to return to more normal functional activity level    Time 6    Period Weeks    Status New    Target Date 11/18/19      PT LONG TERM GOAL #2   Title Improve trunk mobility to WFL's and pain free    Time 6    Period Weeks    Status New    Target Date 11/18/19       PT LONG TERM GOAL #3   Title Patient to demonstrate core strengthening and stabilization program to decrease risk of recurrent LBP    Time 6    Period Weeks    Status New    Target Date 11/18/19      PT LONG TERM GOAL #4   Title The patient will  be able to sit in a chair for 30 min without R gluteal pain and without compensatory L leaning.    Time 6    Period Weeks    Status New    Target Date 11/18/19      PT LONG TERM GOAL #5   Title Independent in HEP    Time 6    Period Weeks    Status New    Target Date 11/18/19      PT LONG TERM GOAL #6   Title Improve FOTO to </= 30% limitation    Time 6    Period Weeks    Status New    Target Date 11/18/19                 Plan - 10/29/19 1853    Clinical Impression Statement The patient continues with R glut pain worse today due to prolonged sitting.  She feels she is continuing to make progress with mobility and overall function.  PT focused on spinal mobility and core stabiization today.  Also discussed strategies for STM at work (tennis ball on wall) for days pain increases due to sitting.    Rehab Potential Good    PT Frequency 2x / week    PT Duration 6 weeks    PT Treatment/Interventions Patient/family education;ADLs/Self Care Home Management;Aquatic Therapy;Cryotherapy;Electrical Stimulation;Iontophoresis 4mg /ml Dexamethasone;Moist Heat;Traction;Ultrasound;Functional mobility training;Therapeutic activities;Therapeutic exercise;Balance training;Neuromuscular re-education;Manual techniques;Dry needling;Taping    PT Next Visit Plan ** add written HEP focusing on HA length (consider askling protocol);  / isolated glut strengthening; review spine care principles; review HEP; address spinal mobility; teach hip hinge; assess response to DN(bilat QL and Rt posterior hip) and manual work and continue as indicated; modalities as indicated    PT [place] and Agree with Plan  of Care Patient            Patient will benefit from skilled therapeutic intervention in order to improve the following deficits and impairments:     Visit Diagnosis: Acute right-sided low back pain with right-sided sciatica  Muscle weakness (generalized)  Other symptoms and signs involving the nervous system  Other symptoms and signs involving the musculoskeletal system     Problem List Patient Active Problem List   Diagnosis Date Noted  . B12 deficiency 02/27/2018  . Lymphadenopathy 12/29/2017  . History of melanoma 12/29/2017  . Radiculitis of right cervical region 12/03/2017  . Stress fracture of metatarsal bone of right foot 05/24/2017  . Asthma, mild persistent 10/04/2015  . Abnormal weight gain 01/20/2015  . Tachycardia 05/31/2014  . Recurrent UTI 05/06/2014  . DUB (dysfunctional uterine bleeding) 05/06/2014  . Lumbar spondylosis 04/02/2013  . Migraine 02/13/2012  . Major depression, recurrent ([place]) 12/14/2011  . Hypothyroid 11/01/2010  . PCOS (polycystic ovarian syndrome) 11/01/2010  . Chronic right SI joint pain 11/01/2010  . Polyarthritis with left third PIP osteoarthritis 11/01/2010  . Melanoma ([place]) 11/01/2010  . Essential hypertension, benign 11/01/2010  . ANEMIA, IRON DEFICIENCY 10/17/2010  . INSOMNIA, CHRONIC 10/17/2010  . FATIGUE 10/17/2010  . EDEMA 10/17/2010    Emila Steinhauser, PT 10/29/2019, 8:46 PM  [facility] [place], [place], [address] Phone: [phone]   Fax:  [phone]  Name: Yanisa Goodgame MRN: [id number] Date of Birth: 1970-07-12

## 2019-11-02 ENCOUNTER — Ambulatory Visit (INDEPENDENT_AMBULATORY_CARE_PROVIDER_SITE_OTHER): Payer: BC Managed Care – PPO | Admitting: Rehabilitative and Restorative Service Providers"

## 2019-11-02 ENCOUNTER — Other Ambulatory Visit: Payer: Self-pay

## 2019-11-02 ENCOUNTER — Encounter: Payer: Self-pay | Admitting: Rehabilitative and Restorative Service Providers"

## 2019-11-02 DIAGNOSIS — R29898 Other symptoms and signs involving the musculoskeletal system: Secondary | ICD-10-CM | POA: Diagnosis not present

## 2019-11-02 DIAGNOSIS — M5441 Lumbago with sciatica, right side: Secondary | ICD-10-CM | POA: Diagnosis not present

## 2019-11-02 DIAGNOSIS — R29818 Other symptoms and signs involving the nervous system: Secondary | ICD-10-CM

## 2019-11-02 DIAGNOSIS — M5440 Lumbago with sciatica, unspecified side: Secondary | ICD-10-CM

## 2019-11-02 DIAGNOSIS — M6281 Muscle weakness (generalized): Secondary | ICD-10-CM | POA: Diagnosis not present

## 2019-11-02 NOTE — Therapy (Signed)
[place], [place], [address] Phone: [phone]   Fax:  [phone]  Physical Therapy Treatment  Patient Details  Name: Carly [name] MRN: [id number] Date of Birth: 1970/12/17 Referring Provider (PT): Dr Dianah Field    Encounter Date: 11/02/2019   PT End of Session - 11/02/19 0806    Visit Number 8    Number of Visits 12    Date for PT Re-Evaluation 11/18/19    PT Start Time 0800    PT Stop Time 0854    PT Time Calculation (min) 54 min    Activity Tolerance Patient tolerated treatment well           Past Medical History:  Diagnosis Date  . Arthritis   . Hypertension   . PCOS (polycystic ovarian syndrome)   . Thyroid disease   . TIA (transient ischemic attack)     Past Surgical History:  Procedure Laterality Date  . 2 ectopic pregnancies  1761,6073  . lymph nodes    . melanoma removal     RT SIDE  . removal of fallopian tube  1999    There were no vitals filed for this visit.   Subjective Assessment - 11/02/19 0807    Subjective Patient reports that the pain and tightness in the LB abd Rt hip today. She worked in the house and was up and down the stairs more yesterday. Just a busy day yesterday and did not stretch as much.    Currently in Pain? Yes    Pain Score 5     Pain Location Buttocks    Pain Orientation Right    Pain Descriptors / Indicators Tightness;Nagging    Pain Type Chronic pain                             [facility] Adult PT Treatment/Exercise - 11/02/19 0001      Lumbar Exercises: Stretches   Piriformis Stretch Right;Left;1 rep;30 seconds   supine travell    Other Lumbar Stretch Exercise neural mobilization supine x 10 reps       Lumbar Exercises: Quadruped   Madcat/Old Horse 10 reps      Moist Heat Therapy   Number Minutes Moist Heat 12 Minutes    Moist Heat Location Lumbar Spine;Hip   Rt posterior hip      Manual Therapy   Manual  Therapy Joint mobilization    Manual therapy comments skilled palpation to assess soft tissue response to DN and manual work     Joint Mobilization PA mobs lumbar spine Grade II, R SI compression PA mobs grade II-III     Soft tissue mobilization deep tissue work through the Rt > Lt lumbar and posterior hip/buttocks into Rt hamstrings     Myofascial Release Rt lumbar to posterior hip     Passive ROM IR/ER Rt hip pt prone hip extended; knee flexed to 90 deg             Trigger Point Dry Needling - 11/02/19 0001    Consent Given? Yes    Education Handout Provided Previously provided    Other Dry Needling Rt/Lt lumbar     Gluteus Medius Response Palpable increased muscle length    Gluteus Maximus Response Palpable increased muscle length    Piriformis Response Palpable increased muscle length    Lumbar multifidi Response Palpable increased muscle length    Quadratus Lumborum Response Palpable increased muscle  length                     PT Long Term Goals - 10/07/19 1728      PT LONG TERM GOAL #1   Title Decrease LBP and Rt LE pain by 50-75% allowing patient to return to more normal functional activity level    Time 6    Period Weeks    Status New    Target Date 11/18/19      PT LONG TERM GOAL #2   Title Improve trunk mobility to WFL's and pain free    Time 6    Period Weeks    Status New    Target Date 11/18/19      PT LONG TERM GOAL #3   Title Patient to demonstrate core strengthening and stabilization program to decrease risk of recurrent LBP    Time 6    Period Weeks    Status New    Target Date 11/18/19      PT LONG TERM GOAL #4   Title The patient will  be able to sit in a chair for 30 min without R gluteal pain and without compensatory L leaning.    Time 6    Period Weeks    Status New    Target Date 11/18/19      PT LONG TERM GOAL #5   Title Independent in HEP    Time 6    Period Weeks    Status New    Target Date 11/18/19      PT LONG TERM  GOAL #6   Title Improve FOTO to </= 30% limitation    Time 6    Period Weeks    Status New    Target Date 11/18/19                 Plan - 11/02/19 0815    Clinical Impression Statement Persistent tightness through bilat lumbar spine and Rt posterior buttocks. Pain present for years. Injections frequently since 2006 likely dealing with scar tissue and nerve entrapment. Improving segmental mobility through the lumbar spine    Rehab Potential Good    PT Frequency 2x / week    PT Duration 6 weeks    PT Treatment/Interventions Patient/family education;ADLs/Self Care Home Management;Aquatic Therapy;Cryotherapy;Electrical Stimulation;Iontophoresis 4mg /ml Dexamethasone;Moist Heat;Traction;Ultrasound;Functional mobility training;Therapeutic activities;Therapeutic exercise;Balance training;Neuromuscular re-education;Manual techniques;Dry needling;Taping    PT Next Visit Plan ** add written HEP focusing on HA length (consider askling protocol);  / isolated glut strengthening; review spine care principles; review HEP; address spinal mobility; teach hip hinge; assess response to DN(bilat QL and Rt posterior hip) and manual work and continue as indicated; modalities as indicated    PT Home Exercise Plan [email or web address]    Consulted and Agree with Plan of Care Patient           Patient will benefit from skilled therapeutic intervention in order to improve the following deficits and impairments:     Visit Diagnosis: Acute right-sided low back pain with right-sided sciatica  Muscle weakness (generalized)  Other symptoms and signs involving the nervous system  Other symptoms and signs involving the musculoskeletal system  Acute right-sided low back pain with sciatica, sciatica laterality unspecified     Problem List Patient Active Problem List   Diagnosis Date Noted  . B12 deficiency 02/27/2018  . Lymphadenopathy 12/29/2017  . History of melanoma 12/29/2017  . Radiculitis of right cervical  region 12/03/2017  . Stress fracture of metatarsal bone  of right foot 05/24/2017  . Asthma, mild persistent 10/04/2015  . Abnormal weight gain 01/20/2015  . Tachycardia 05/31/2014  . Recurrent UTI 05/06/2014  . DUB (dysfunctional uterine bleeding) 05/06/2014  . Lumbar spondylosis 04/02/2013  . Migraine 02/13/2012  . Major depression, recurrent ([place]) 12/14/2011  . Hypothyroid 11/01/2010  . PCOS (polycystic ovarian syndrome) 11/01/2010  . Chronic right SI joint pain 11/01/2010  . Polyarthritis with left third PIP osteoarthritis 11/01/2010  . Melanoma ([place]) 11/01/2010  . Essential hypertension, benign 11/01/2010  . ANEMIA, IRON DEFICIENCY 10/17/2010  . INSOMNIA, CHRONIC 10/17/2010  . FATIGUE 10/17/2010  . EDEMA 10/17/2010    Medard Decuir P. Helene Kelp PT, MPH 11/02/19 8:46 AM   [facility] [place], [place], [address] Phone: [phone]   Fax:  [phone]  Name: Ellah Otte MRN: [id number] Date of Birth: 1970-08-16

## 2019-11-05 ENCOUNTER — Ambulatory Visit (INDEPENDENT_AMBULATORY_CARE_PROVIDER_SITE_OTHER): Payer: BC Managed Care – PPO | Admitting: Rehabilitative and Restorative Service Providers"

## 2019-11-05 ENCOUNTER — Encounter: Payer: Self-pay | Admitting: Rehabilitative and Restorative Service Providers"

## 2019-11-05 ENCOUNTER — Other Ambulatory Visit: Payer: Self-pay

## 2019-11-05 DIAGNOSIS — M6281 Muscle weakness (generalized): Secondary | ICD-10-CM

## 2019-11-05 DIAGNOSIS — M5441 Lumbago with sciatica, right side: Secondary | ICD-10-CM

## 2019-11-05 DIAGNOSIS — R29898 Other symptoms and signs involving the musculoskeletal system: Secondary | ICD-10-CM

## 2019-11-05 DIAGNOSIS — M5440 Lumbago with sciatica, unspecified side: Secondary | ICD-10-CM

## 2019-11-05 DIAGNOSIS — R29818 Other symptoms and signs involving the nervous system: Secondary | ICD-10-CM | POA: Diagnosis not present

## 2019-11-05 NOTE — Therapy (Signed)
[place], [place], [address] Phone: [phone]   Fax:  [phone]  Physical Therapy Treatment  Patient Details  Name: Carly [name] MRN: [id number] Date of Birth: May 14, 1970 Referring Provider (PT): Dr Dianah Field    Encounter Date: 11/05/2019   PT End of Session - 11/05/19 0810    Visit Number 9    Number of Visits 12    Date for PT Re-Evaluation 11/18/19    PT Start Time 0803    PT Stop Time 2947    PT Time Calculation (min) 51 min    Activity Tolerance Patient tolerated treatment well           Past Medical History:  Diagnosis Date  . Arthritis   . Hypertension   . PCOS (polycystic ovarian syndrome)   . Thyroid disease   . TIA (transient ischemic attack)     Past Surgical History:  Procedure Laterality Date  . 2 ectopic pregnancies  [id number],[id number]  . lymph nodes    . melanoma removal     RT SIDE  . removal of fallopian tube  1999    There were no vitals filed for this visit.   Subjective Assessment - 11/05/19 0822    Subjective Improving. Less pain in the Rt butt - intensity is decreased. Stretching is feeling better.    Currently in Pain? Yes    Pain Score 1     Pain Location Buttocks    Pain Orientation Right    Pain Descriptors / Indicators Tightness;Nagging    Pain Type Chronic pain    Pain Onset More than a month ago    Pain Frequency Constant    Aggravating Factors  sitting; lying down    Pain Relieving Factors injections; walking; PT                             [facility] Adult PT Treatment/Exercise - 11/05/19 0001      Lumbar Exercises: Stretches   Piriformis Stretch Right;Left;1 rep;30 seconds   supine travell    Other Lumbar Stretch Exercise neural mobilization supine x 10 reps       Lumbar Exercises: Standing   Other Standing Lumbar Exercises lifting 15# KB from 10" stool x 10 working on hinged hip keeping wt close to the shins.        Lumbar Exercises: Quadruped   Madcat/Old Horse 10 reps   repeated after mobilization and again after DN      Moist Heat Therapy   Number Minutes Moist Heat 12 Minutes    Moist Heat Location Lumbar Spine;Hip   Rt posterior hip      Manual Therapy   Manual Therapy Joint mobilization    Manual therapy comments skilled palpation to assess soft tissue response to DN and manual work     Joint Mobilization PA mobs lumbar spine Grade II, R SI compression PA mobs grade II-III     Soft tissue mobilization deep tissue work through the Rt > Lt lumbar and posterior hip/buttocks into Rt hamstrings     Myofascial Release Rt lumbar to posterior hip     Passive ROM trunk rotation pt sidelying bilat - best when she was lying on the Rt side; IR/ER Rt hip pt prone hip extended; knee flexed to 90 deg     Kinesiotex --   I strip along lumbar spine to encourage anterior pelvic tilt  Trigger Point Dry Needling - 11/05/19 0001    Consent Given? Yes    Education Handout Provided Previously provided    Electrical Stimulation Performed with Dry Needling Yes    Other Dry Needling Rt     Lumbar multifidi Response Palpable increased muscle length                PT Education - 11/05/19 0848    Education Details deadlift    Person(s) Educated Patient    Methods Explanation;Demonstration;Tactile cues;Verbal cues;Handout    Comprehension Verbalized understanding;Returned demonstration;Verbal cues required;Tactile cues required               PT Long Term Goals - 10/07/19 1728      PT LONG TERM GOAL #1   Title Decrease LBP and Rt LE pain by 50-75% allowing patient to return to more normal functional activity level    Time 6    Period Weeks    Status New    Target Date 11/18/19      PT LONG TERM GOAL #2   Title Improve trunk mobility to WFL's and pain free    Time 6    Period Weeks    Status New    Target Date 11/18/19      PT LONG TERM GOAL #3   Title Patient to demonstrate core  strengthening and stabilization program to decrease risk of recurrent LBP    Time 6    Period Weeks    Status New    Target Date 11/18/19      PT LONG TERM GOAL #4   Title The patient will  be able to sit in a chair for 30 min without R gluteal pain and without compensatory L leaning.    Time 6    Period Weeks    Status New    Target Date 11/18/19      PT LONG TERM GOAL #5   Title Independent in HEP    Time 6    Period Weeks    Status New    Target Date 11/18/19      PT LONG TERM GOAL #6   Title Improve FOTO to </= 30% limitation    Time 6    Period Weeks    Status New    Target Date 11/18/19                 Plan - 11/05/19 0850    Clinical Impression Statement Improving tightness through the lumbar spine with decreased intensity in the Rt posterior buttocks. Pain is improving. Mobility and ROM is increasing. Will add lifting - strengthening.    Rehab Potential Good    PT Frequency 2x / week    PT Duration 6 weeks    PT Treatment/Interventions Patient/family education;ADLs/Self Care Home Management;Aquatic Therapy;Cryotherapy;Electrical Stimulation;Iontophoresis 4mg /ml Dexamethasone;Moist Heat;Traction;Ultrasound;Functional mobility training;Therapeutic activities;Therapeutic exercise;Balance training;Neuromuscular re-education;Manual techniques;Dry needling;Taping    PT Next Visit Plan ** add written HEP focusing on HA length (consider askling protocol);  / isolated glut strengthening; review spine care principles; review HEP; address spinal mobility; teach hip hinge; assess response to DN(bilat QL and Rt posterior hip) and manual work and continue as indicated; modalities as indicated    PT Walker and Agree with Plan of Care Patient           Patient will benefit from skilled therapeutic intervention in order to improve the following deficits and impairments:     Visit Diagnosis: Acute  right-sided low back pain with right-sided  sciatica  Muscle weakness (generalized)  Other symptoms and signs involving the nervous system  Other symptoms and signs involving the musculoskeletal system  Acute right-sided low back pain with sciatica, sciatica laterality unspecified     Problem List Patient Active Problem List   Diagnosis Date Noted  . B12 deficiency 02/27/2018  . Lymphadenopathy 12/29/2017  . History of melanoma 12/29/2017  . Radiculitis of right cervical region 12/03/2017  . Stress fracture of metatarsal bone of right foot 05/24/2017  . Asthma, mild persistent 10/04/2015  . Abnormal weight gain 01/20/2015  . Tachycardia 05/31/2014  . Recurrent UTI 05/06/2014  . DUB (dysfunctional uterine bleeding) 05/06/2014  . Lumbar spondylosis 04/02/2013  . Migraine 02/13/2012  . Major depression, recurrent ([place]) 12/14/2011  . Hypothyroid 11/01/2010  . PCOS (polycystic ovarian syndrome) 11/01/2010  . Chronic right SI joint pain 11/01/2010  . Polyarthritis with left third PIP osteoarthritis 11/01/2010  . Melanoma ([place]) 11/01/2010  . Essential hypertension, benign 11/01/2010  . ANEMIA, IRON DEFICIENCY 10/17/2010  . INSOMNIA, CHRONIC 10/17/2010  . FATIGUE 10/17/2010  . EDEMA 10/17/2010    Shamiya Demeritt Nilda Simmer PT, MPH  11/05/2019, 8:53 AM  [facility] [place], [place], [address] Phone: [phone]   Fax:  [phone]  Name: Katrina Daddona MRN: [id number] Date of Birth: [birth year]

## 2019-11-09 ENCOUNTER — Other Ambulatory Visit: Payer: Self-pay | Admitting: Family Medicine

## 2019-11-10 ENCOUNTER — Ambulatory Visit (INDEPENDENT_AMBULATORY_CARE_PROVIDER_SITE_OTHER): Payer: BC Managed Care – PPO | Admitting: Rehabilitative and Restorative Service Providers"

## 2019-11-10 ENCOUNTER — Encounter: Payer: Self-pay | Admitting: Rehabilitative and Restorative Service Providers"

## 2019-11-10 ENCOUNTER — Other Ambulatory Visit: Payer: Self-pay

## 2019-11-10 DIAGNOSIS — M5440 Lumbago with sciatica, unspecified side: Secondary | ICD-10-CM

## 2019-11-10 DIAGNOSIS — M5441 Lumbago with sciatica, right side: Secondary | ICD-10-CM | POA: Diagnosis not present

## 2019-11-10 DIAGNOSIS — R29898 Other symptoms and signs involving the musculoskeletal system: Secondary | ICD-10-CM | POA: Diagnosis not present

## 2019-11-10 DIAGNOSIS — R29818 Other symptoms and signs involving the nervous system: Secondary | ICD-10-CM

## 2019-11-10 DIAGNOSIS — M6281 Muscle weakness (generalized): Secondary | ICD-10-CM | POA: Diagnosis not present

## 2019-11-10 NOTE — Therapy (Signed)
[place], [place], [address] Phone: [phone]   Fax:  [phone]  Physical Therapy Treatment  Patient Details  Name: Carly [name] MRN: [id number] Date of Birth: 04/28/1970 Referring Provider (PT): Dr Dianah Field    Encounter Date: 11/10/2019    Past Medical History:  Diagnosis Date  . Arthritis   . Hypertension   . PCOS (polycystic ovarian syndrome)   . Thyroid disease   . TIA (transient ischemic attack)     Past Surgical History:  Procedure Laterality Date  . 2 ectopic pregnancies  9702,6378  . lymph nodes    . melanoma removal     RT SIDE  . removal of fallopian tube  1999    There were no vitals filed for this visit.   Subjective Assessment - 11/10/19 0854    Subjective Some tightness in the Rt posterior hip. Hiked some while camping this weekend. She feels she is making progress.    Currently in Pain? Yes    Pain Score 3     Pain Location Buttocks    Pain Orientation Right    Pain Descriptors / Indicators Tightness;Nagging    Pain Type Chronic pain              [facility] PT Assessment - 11/10/19 0001      Assessment   Medical Diagnosis Rt lumbar spondylosis    Referring Provider (PT) Dr Dianah Field     Onset Date/Surgical Date 09/06/19    Hand Dominance Right      Flexibility   Hamstrings tight Rt > Lt     Quadriceps tight Rt > Lt     ITB tight Rt > Lt     Piriformis tight Rt > Lt       Palpation   Spinal mobility hypomobility lower lumbar spine with PA mobs     Palpation comment muscular tightness through the Rt lumbar spine/lats/QL/piriformis/gluts/hamstrings                          [facility] Adult PT Treatment/Exercise - 11/10/19 0001      Neuro Re-ed    Neuro Re-ed Details  working on lumbar lordosis and neutral spine in functional activities and positions       Lumbar Exercises: Stretches   Press Ups 10 reps;5 seconds    Press Ups  Limitations 2 sets with mobs in between     Other Lumbar Stretch Exercise neural mobilization supine x 10 reps       Lumbar Exercises: Seated   Sit to Stand 20 reps    Sit to Stand Limitations working on hinged hip sit to stand and stand to sit       Knee/Hip Exercises: Standing   Other Standing Knee Exercises partial squat small range 10 reps x 2 sets using 10# KB second set       Moist Heat Therapy   Number Minutes Moist Heat 15 Minutes    Moist Heat Location Lumbar Spine;Hip   Rt posterior hip      Manual Therapy   Manual Therapy Joint mobilization    Joint Mobilization PA mobs lumbar spine Grade II, R SI compression PA mobs grade II-III     Soft tissue mobilization deep tissue work through the Rt > Lt lumbar and posterior hip/buttocks into Rt hamstrings     Kinesiotex --   I strip along lumbar spine to encourage anterior pelvic tilt  PT Long Term Goals - 10/07/19 1728      PT LONG TERM GOAL #1   Title Decrease LBP and Rt LE pain by 50-75% allowing patient to return to more normal functional activity level    Time 6    Period Weeks    Status New    Target Date 11/18/19      PT LONG TERM GOAL #2   Title Improve trunk mobility to WFL's and pain free    Time 6    Period Weeks    Status New    Target Date 11/18/19      PT LONG TERM GOAL #3   Title Patient to demonstrate core strengthening and stabilization program to decrease risk of recurrent LBP    Time 6    Period Weeks    Status New    Target Date 11/18/19      PT LONG TERM GOAL #4   Title The patient will  be able to sit in a chair for 30 min without R gluteal pain and without compensatory L leaning.    Time 6    Period Weeks    Status New    Target Date 11/18/19      PT LONG TERM GOAL #5   Title Independent in HEP    Time 6    Period Weeks    Status New    Target Date 11/18/19      PT LONG TERM GOAL #6   Title Improve FOTO to </= 30% limitation    Time 6    Period  Weeks    Status New    Target Date 11/18/19                 Plan - 11/10/19 0905    Clinical Impression Statement Some improvement in lumbar spinal mobility with mobs and stretching. Decreased intensity of pain in the Rt buttocks. Good response to neuromuscular re-education.    Rehab Potential Good    PT Frequency 2x / week    PT Duration 6 weeks    PT Treatment/Interventions Patient/family education;ADLs/Self Care Home Management;Aquatic Therapy;Cryotherapy;Electrical Stimulation;Iontophoresis 4mg /ml Dexamethasone;Moist Heat;Traction;Ultrasound;Functional mobility training;Therapeutic activities;Therapeutic exercise;Balance training;Neuromuscular re-education;Manual techniques;Dry needling;Taping    PT Next Visit Plan ** add written HEP focusing on HA length (consider askling protocol);  / isolated glut strengthening; review spine care principles; review HEP; address spinal mobility; teach hip hinge; assess response to DN(bilat QL and Rt posterior hip) and manual work and continue as indicated; modalities as indicated    PT Home Exercise Plan [email or web address]    Consulted and Agree with Plan of Care Patient           Patient will benefit from skilled therapeutic intervention in order to improve the following deficits and impairments:     Visit Diagnosis: Acute right-sided low back pain with right-sided sciatica  Muscle weakness (generalized)  Other symptoms and signs involving the nervous system  Other symptoms and signs involving the musculoskeletal system  Acute right-sided low back pain with sciatica, sciatica laterality unspecified     Problem List Patient Active Problem List   Diagnosis Date Noted  . B12 deficiency 02/27/2018  . Lymphadenopathy 12/29/2017  . History of melanoma 12/29/2017  . Radiculitis of right cervical region 12/03/2017  . Stress fracture of metatarsal bone of right foot 05/24/2017  . Asthma, mild persistent 10/04/2015  . Abnormal weight gain  01/20/2015  . Tachycardia 05/31/2014  . Recurrent UTI 05/06/2014  . DUB (dysfunctional uterine bleeding)  05/06/2014  . Lumbar spondylosis 04/02/2013  . Migraine 02/13/2012  . Major depression, recurrent ([place]) 12/14/2011  . Hypothyroid 11/01/2010  . PCOS (polycystic ovarian syndrome) 11/01/2010  . Chronic right SI joint pain 11/01/2010  . Polyarthritis with left third PIP osteoarthritis 11/01/2010  . Melanoma ([place]) 11/01/2010  . Essential hypertension, benign 11/01/2010  . ANEMIA, IRON DEFICIENCY 10/17/2010  . INSOMNIA, CHRONIC 10/17/2010  . FATIGUE 10/17/2010  . EDEMA 10/17/2010    Carly [name] Nilda Simmer PT, MPH  11/10/2019, 9:43 AM  [facility] [place], [place], [address] Phone: [phone]   Fax:  [phone]  Name: Carly [name] MRN: [id number] Date of Birth: 1970/05/13

## 2019-11-13 ENCOUNTER — Other Ambulatory Visit: Payer: Self-pay

## 2019-11-13 ENCOUNTER — Encounter: Payer: Self-pay | Admitting: Rehabilitative and Restorative Service Providers"

## 2019-11-13 ENCOUNTER — Ambulatory Visit (INDEPENDENT_AMBULATORY_CARE_PROVIDER_SITE_OTHER): Payer: BC Managed Care – PPO | Admitting: Rehabilitative and Restorative Service Providers"

## 2019-11-13 DIAGNOSIS — M6281 Muscle weakness (generalized): Secondary | ICD-10-CM | POA: Diagnosis not present

## 2019-11-13 DIAGNOSIS — R29898 Other symptoms and signs involving the musculoskeletal system: Secondary | ICD-10-CM | POA: Diagnosis not present

## 2019-11-13 DIAGNOSIS — R29818 Other symptoms and signs involving the nervous system: Secondary | ICD-10-CM | POA: Diagnosis not present

## 2019-11-13 DIAGNOSIS — M5440 Lumbago with sciatica, unspecified side: Secondary | ICD-10-CM

## 2019-11-13 DIAGNOSIS — M5441 Lumbago with sciatica, right side: Secondary | ICD-10-CM

## 2019-11-13 NOTE — Therapy (Signed)
[place], [place], [address] Phone: [phone]   Fax:  [phone]  Physical Therapy Treatment  Patient Details  Name: Carly [name] MRN: [id number] Date of Birth: 1970-12-26 Referring Provider (PT): Dr Dianah Field    Encounter Date: 11/13/2019   PT End of Session - 11/13/19 0943    Visit Number 11    Number of Visits 12    Date for PT Re-Evaluation 11/18/19    PT Start Time 0945    PT Stop Time 1036    PT Time Calculation (min) 51 min    Activity Tolerance Patient tolerated treatment well           Past Medical History:  Diagnosis Date  . Arthritis   . Hypertension   . PCOS (polycystic ovarian syndrome)   . Thyroid disease   . TIA (transient ischemic attack)     Past Surgical History:  Procedure Laterality Date  . 2 ectopic pregnancies  5093,2671  . lymph nodes    . melanoma removal     RT SIDE  . removal of fallopian tube  1999    There were no vitals filed for this visit.   Subjective Assessment - 11/13/19 0943    Subjective Can tell she is moving better in the cat cow - pain in the posterior hip is the same. Can feel the nerve pain in the posterior hip with DN in the Rt lower lumbar spine    Currently in Pain? Yes    Pain Score 3     Pain Location Buttocks    Pain Orientation Right    Pain Descriptors / Indicators Tightness;Nagging    Pain Type Chronic pain    Pain Radiating Towards posterior hip to posterior thigh    Pain Onset More than a month ago    Pain Frequency Constant    Aggravating Factors  sitting; lying down    Pain Relieving Factors injections; walking; PT              [facility] PT Assessment - 11/13/19 0001      Assessment   Medical Diagnosis Rt lumbar spondylosis    Referring Provider (PT) Dr Dianah Field     Onset Date/Surgical Date 09/06/19    Hand Dominance Right      Palpation   Spinal mobility hypomobility lower lumbar spine with PA  mobs     Palpation comment muscular tightness through the Rt lumbar spine/lats/QL/piriformis/gluts/hamstrings                          [facility] Adult PT Treatment/Exercise - 11/13/19 0001      Lumbar Exercises: Stretches   Press Ups 10 reps;5 seconds   press up w/ PT lumbar mob    Press Ups Limitations 2 sets with mobs in between     Other Lumbar Stretch Exercise neural mobilization supine x 10 reps       Lumbar Exercises: Seated   Sit to Stand 20 reps    Sit to Stand Limitations working on hinged hip sit to stand and stand to sit       Lumbar Exercises: Quadruped   Madcat/Old Horse 10 reps   2 sets      Moist Heat Therapy   Number Minutes Moist Heat 15 Minutes    Moist Heat Location Lumbar Spine;Hip   Rt posterior hip      Manual Therapy   Manual Therapy Joint mobilization  Manual therapy comments skilled palpation to assess soft tissue response to DN and manual work     Joint Mobilization PA mobs lumbar spine Grade II, R SI compression PA mobs grade II-III     Soft tissue mobilization deep tissue work through the Rt > Lt lumbar and posterior hip/buttocks into Rt hamstrings     Myofascial Release Rt lumbar to posterior hip     Passive ROM distraction through Rt hip with Rt LE in neutral extension and with hip and knee flexed 10-20 sec pull x 5 reps     Kinesiotex --   I strip along lumbar spine to encourage anterior pelvic tilt                      PT Long Term Goals - 10/07/19 1728      PT LONG TERM GOAL #1   Title Decrease LBP and Rt LE pain by 50-75% allowing patient to return to more normal functional activity level    Time 6    Period Weeks    Status New    Target Date 11/18/19      PT LONG TERM GOAL #2   Title Improve trunk mobility to WFL's and pain free    Time 6    Period Weeks    Status New    Target Date 11/18/19      PT LONG TERM GOAL #3   Title Patient to demonstrate core strengthening and stabilization program to decrease  risk of recurrent LBP    Time 6    Period Weeks    Status New    Target Date 11/18/19      PT LONG TERM GOAL #4   Title The patient will  be able to sit in a chair for 30 min without R gluteal pain and without compensatory L leaning.    Time 6    Period Weeks    Status New    Target Date 11/18/19      PT LONG TERM GOAL #5   Title Independent in HEP    Time 6    Period Weeks    Status New    Target Date 11/18/19      PT LONG TERM GOAL #6   Title Improve FOTO to </= 30% limitation    Time 6    Period Weeks    Status New    Target Date 11/18/19                 Plan - 11/13/19 1025    Clinical Impression Statement Persistent tightness in lumbosacral segmental mobility and into Rt SI/ischeal tuberosity. Patient responded well to DN and manual work with improved mobilty. Trial of traction through the Rt LE pt in supine - traction with LE in neutral and with hip flexed. Pt reports that traction "feels good". Will try inversion table at home  - she used to use inversion table every day but then it started to hurt. Will try for a short time and assess response. Tape along the paraspinals helped with posture and alignment. Patient continues to work consistently at home.    Rehab Potential Good    PT Frequency 2x / week    PT Duration 6 weeks    PT Treatment/Interventions Patient/family education;ADLs/Self Care Home Management;Aquatic Therapy;Cryotherapy;Electrical Stimulation;Iontophoresis 4mg /ml Dexamethasone;Moist Heat;Traction;Ultrasound;Functional mobility training;Therapeutic activities;Therapeutic exercise;Balance training;Neuromuscular re-education;Manual techniques;Dry needling;Taping    PT Next Visit Plan ** add written HEP focusing on HA length (consider askling  protocol);  / isolated glut strengthening; review spine care principles; review HEP; address spinal mobility; teach hip hinge; assess response to DN(bilat QL and Rt posterior hip) and manual work and continue as  indicated; modalities as indicated    PT Home Exercise Plan [email or web address]    Consulted and Agree with Plan of Care Patient           Patient will benefit from skilled therapeutic intervention in order to improve the following deficits and impairments:     Visit Diagnosis: Acute right-sided low back pain with right-sided sciatica  Muscle weakness (generalized)  Other symptoms and signs involving the nervous system  Other symptoms and signs involving the musculoskeletal system  Acute right-sided low back pain with sciatica, sciatica laterality unspecified     Problem List Patient Active Problem List   Diagnosis Date Noted  . B12 deficiency 02/27/2018  . Lymphadenopathy 12/29/2017  . History of melanoma 12/29/2017  . Radiculitis of right cervical region 12/03/2017  . Stress fracture of metatarsal bone of right foot 05/24/2017  . Asthma, mild persistent 10/04/2015  . Abnormal weight gain 01/20/2015  . Tachycardia 05/31/2014  . Recurrent UTI 05/06/2014  . DUB (dysfunctional uterine bleeding) 05/06/2014  . Lumbar spondylosis 04/02/2013  . Migraine 02/13/2012  . Major depression, recurrent ([place]) 12/14/2011  . Hypothyroid 11/01/2010  . PCOS (polycystic ovarian syndrome) 11/01/2010  . Chronic right SI joint pain 11/01/2010  . Polyarthritis with left third PIP osteoarthritis 11/01/2010  . Melanoma ([place]) 11/01/2010  . Essential hypertension, benign 11/01/2010  . ANEMIA, IRON DEFICIENCY 10/17/2010  . INSOMNIA, CHRONIC 10/17/2010  . FATIGUE 10/17/2010  . EDEMA 10/17/2010    Vollie Brunty Nilda Simmer  PT, MPH  11/13/2019, 10:32 AM  [facility] [place], [place], [address] Phone: [phone]   Fax:  [phone]  Name: Carly [name] MRN: [id number] Date of Birth: 1970/09/24

## 2019-11-16 ENCOUNTER — Encounter: Payer: Self-pay | Admitting: Rehabilitative and Restorative Service Providers"

## 2019-11-16 ENCOUNTER — Ambulatory Visit (INDEPENDENT_AMBULATORY_CARE_PROVIDER_SITE_OTHER): Payer: BC Managed Care – PPO | Admitting: Rehabilitative and Restorative Service Providers"

## 2019-11-16 ENCOUNTER — Other Ambulatory Visit: Payer: Self-pay

## 2019-11-16 DIAGNOSIS — M6281 Muscle weakness (generalized): Secondary | ICD-10-CM

## 2019-11-16 DIAGNOSIS — R29898 Other symptoms and signs involving the musculoskeletal system: Secondary | ICD-10-CM

## 2019-11-16 DIAGNOSIS — M5440 Lumbago with sciatica, unspecified side: Secondary | ICD-10-CM

## 2019-11-16 DIAGNOSIS — R29818 Other symptoms and signs involving the nervous system: Secondary | ICD-10-CM | POA: Diagnosis not present

## 2019-11-16 DIAGNOSIS — M5441 Lumbago with sciatica, right side: Secondary | ICD-10-CM

## 2019-11-16 NOTE — Therapy (Signed)
[place], [place], [address] Phone: [phone]   Fax:  [phone]  Physical Therapy Treatment  Patient Details  Name: Carly [name] MRN: [id number] Date of Birth: 05-27-1970 Referring Provider (PT): Dr Dianah Field    Encounter Date: 11/16/2019   PT End of Session - 11/16/19 0821    Visit Number 12    Number of Visits 24    Date for PT Re-Evaluation 12/30/19    PT Start Time 0803    PT Stop Time 0854    PT Time Calculation (min) 51 min    Activity Tolerance Patient tolerated treatment well           Past Medical History:  Diagnosis Date   Arthritis    Hypertension    PCOS (polycystic ovarian syndrome)    Thyroid disease    TIA (transient ischemic attack)     Past Surgical History:  Procedure Laterality Date   2 ectopic pregnancies  1937,9024   lymph nodes     melanoma removal     RT SIDE   removal of fallopian tube  1999    There were no vitals filed for this visit.   Subjective Assessment - 11/16/19 0822    Subjective Feels we are making progress. Still has the pain in the Rt buttock but frequency intensity and duration of pain is decreasing. Patient continues to have pain ratiating into the Rt ischial tuberosity. Patient relates history of fall several years ago landing on the Rt sacrum and tailbone. She received numerous injections in the coccyx. Very tight and tender to palpation in the Rt coccyx area    Currently in Pain? Yes    Pain Score 2     Pain Location Buttocks    Pain Orientation Right    Pain Descriptors / Indicators Tightness;Nagging    Pain Type Chronic pain              [facility] PT Assessment - 11/16/19 0001      Assessment   Medical Diagnosis Rt lumbar spondylosis    Referring Provider (PT) Dr Dianah Field     Onset Date/Surgical Date 09/06/19    Hand Dominance Right    Prior Therapy here for LBP       Sensation   Additional Comments  radicular tingling into the Rt foot/toes on an intermittent basis       AROM   Lumbar Flexion 80% pulling Rt posterior hip     Lumbar Extension 50% pain Rt buttock    Lumbar - Right Side Bend 90%     Lumbar - Left Side Bend 90%     Lumbar - Right Rotation 40% pain Rt buttock     Lumbar - Left Rotation 40% pain Rt buttock      Strength   Overall Strength Comments WFL's bilat LE's not tested resistively       Palpation   Spinal mobility hypomobility lower lumbar spine with PA mobs     SI assessment  R PSIS tender to touch, also tender along lateral border of R sacrum and into sciatic notch; Rt coccyx area with tightness Rt compared to Lt     Palpation comment muscular tightness through the Rt lumbar spine/lats/QL/piriformis/gluts/hamstrings                          [facility] Adult PT Treatment/Exercise - 11/16/19 0001      Lumbar Exercises: Stretches  Press Ups 10 reps;5 seconds   press up w/ PT lumbar mob    Press Ups Limitations 2 sets with mobs in between     Other Lumbar Stretch Exercise neural mobilization supine x 10 reps     Other Lumbar Stretch Exercise lying over coregeous ball lumbar spine 1-2 min       Lumbar Exercises: Standing   Other Standing Lumbar Exercises lumbar extension with hips at counter to encourage lumbar extension - 3-4 sec hold x 5 x 2 sets second set after mobs and DN       Lumbar Exercises: Supine   Other Supine Lumbar Exercises trunk rotation to each side with overpressure with LE;s to the Rt which creates familiar pain       Lumbar Exercises: Quadruped   Madcat/Old Horse 5 reps   2 sets second set post mobs and DN     Moist Heat Therapy   Number Minutes Moist Heat 15 Minutes    Moist Heat Location Lumbar Spine;Hip   Rt posterior hip      Manual Therapy   Manual Therapy Joint mobilization    Manual therapy comments skilled palpation to assess soft tissue response to DN and manual work     Joint Mobilization PA mobs lumbar spine Grade  II, R SI compression PA mobs grade II-III     Soft tissue mobilization deep tissue work through the Rt > Lt lumbar and posterior hip/buttocks into Rt hamstrings     Myofascial Release Rt lumbar to posterior hip             Trigger Point Dry Needling - 11/16/19 0001    Consent Given? Yes    Education Handout Provided Previously provided    Electrical Stimulation Performed with Dry Needling Yes    Other Dry Needling Rt     Lumbar multifidi Response Palpable increased muscle length                     PT Long Term Goals - 11/16/19 0826      PT LONG TERM GOAL #1   Title Decrease LBP and Rt LE pain by 50-75% allowing patient to return to more normal functional activity level    Time 6    Period Weeks    Status Revised    Target Date 12/30/19      PT LONG TERM GOAL #2   Title Improve trunk mobility to WFL's and pain free    Time 6    Period Weeks    Status Revised    Target Date 12/30/19      PT LONG TERM GOAL #3   Title Patient to demonstrate core strengthening and stabilization program to decrease risk of recurrent LBP    Time 6    Period Weeks    Status Revised    Target Date 12/30/19      PT LONG TERM GOAL #4   Title The patient will  be able to sit in a chair for 30 min without R gluteal pain and without compensatory L leaning.    Baseline now sitting for 20 min    Time 6    Period Weeks    Status Revised    Target Date 12/30/19      PT LONG TERM GOAL #5   Title Independent in HEP    Time 6    Period Weeks    Status Revised    Target Date 12/30/19  PT LONG TERM GOAL #6   Title Improve FOTO to </= 30% limitation    Time 6    Period Weeks    Status Revised    Target Date 12/30/19                 Plan - 11/16/19 0900    Clinical Impression Statement Continued tightness and pain in the Rt ischial tuberosity area and radicular tingling into the Rt LE with lumbar extension. Segmental mobiilty through the lumbar spine has increaesd but  remains limited and creates pain into the familiar area of pain Rt buttock. She has progressed with ROM and reports decreased frequency, intensity and duration of pain with treatment and HEP. Patient will benefit form continued PT to address problems identified.           Patient will benefit from skilled therapeutic intervention in order to improve the following deficits and impairments:     Visit Diagnosis: Acute right-sided low back pain with right-sided sciatica - Plan: PT plan of care cert/re-cert  Muscle weakness (generalized) - Plan: PT plan of care cert/re-cert  Other symptoms and signs involving the nervous system - Plan: PT plan of care cert/re-cert  Other symptoms and signs involving the musculoskeletal system - Plan: PT plan of care cert/re-cert  Acute right-sided low back pain with sciatica, sciatica laterality unspecified - Plan: PT plan of care cert/re-cert     Problem List Patient Active Problem List   Diagnosis Date Noted   B12 deficiency 02/27/2018   Lymphadenopathy 12/29/2017   History of melanoma 12/29/2017   Radiculitis of right cervical region 12/03/2017   Stress fracture of metatarsal bone of right foot 05/24/2017   Asthma, mild persistent 10/04/2015   Abnormal weight gain 01/20/2015   Tachycardia 05/31/2014   Recurrent UTI 05/06/2014   DUB (dysfunctional uterine bleeding) 05/06/2014   Lumbar spondylosis 04/02/2013   Migraine 02/13/2012   Major depression, recurrent ([place]) 12/14/2011   Hypothyroid 11/01/2010   PCOS (polycystic ovarian syndrome) 11/01/2010   Chronic right SI joint pain 11/01/2010   Polyarthritis with left third PIP osteoarthritis 11/01/2010   Melanoma ([place]) 11/01/2010   Essential hypertension, benign 11/01/2010   ANEMIA, IRON DEFICIENCY 10/17/2010   INSOMNIA, CHRONIC 10/17/2010   FATIGUE 10/17/2010   EDEMA 10/17/2010    Jovi Alvizo Nilda Simmer PT, MPH  11/16/2019, 9:16 AM  [facility] [place] [address] [place], [place], [address] Phone: [phone]   Fax:  [phone]  Name: Carly [name] MRN: [id number] Date of Birth: July 08, 1970

## 2019-11-19 ENCOUNTER — Encounter: Payer: Self-pay | Admitting: Rehabilitative and Restorative Service Providers"

## 2019-11-19 ENCOUNTER — Other Ambulatory Visit: Payer: Self-pay

## 2019-11-19 ENCOUNTER — Ambulatory Visit (INDEPENDENT_AMBULATORY_CARE_PROVIDER_SITE_OTHER): Payer: BC Managed Care – PPO | Admitting: Rehabilitative and Restorative Service Providers"

## 2019-11-19 DIAGNOSIS — M6281 Muscle weakness (generalized): Secondary | ICD-10-CM

## 2019-11-19 DIAGNOSIS — R29818 Other symptoms and signs involving the nervous system: Secondary | ICD-10-CM

## 2019-11-19 DIAGNOSIS — M5441 Lumbago with sciatica, right side: Secondary | ICD-10-CM

## 2019-11-19 DIAGNOSIS — M5440 Lumbago with sciatica, unspecified side: Secondary | ICD-10-CM

## 2019-11-19 DIAGNOSIS — R29898 Other symptoms and signs involving the musculoskeletal system: Secondary | ICD-10-CM

## 2019-11-19 NOTE — Therapy (Signed)
[place], [place], [address] Phone: [phone]   Fax:  [phone]  Physical Therapy Treatment  Patient Details  Name: Carly [name] MRN: [id number] Date of Birth: 03/31/1970 Referring Provider (PT): Dr Dianah Field    Encounter Date: 11/19/2019   PT End of Session - 11/19/19 0806    Visit Number 13    Number of Visits 24    Date for PT Re-Evaluation 12/30/19    PT Start Time 0803    PT Stop Time 0853    PT Time Calculation (min) 50 min    Activity Tolerance Patient tolerated treatment well           Past Medical History:  Diagnosis Date   Arthritis    Hypertension    PCOS (polycystic ovarian syndrome)    Thyroid disease    TIA (transient ischemic attack)     Past Surgical History:  Procedure Laterality Date   2 ectopic pregnancies  [id number],9528   lymph nodes     melanoma removal     RT SIDE   removal of fallopian tube  1999    There were no vitals filed for this visit.   Subjective Assessment - 11/19/19 0807    Subjective Feeling better - feels stiff all over but she has no pain in the posterior buttocks. Can tell she is making progress.    Currently in Pain? No/denies              [facility] PT Assessment - 11/19/19 0001      Assessment   Medical Diagnosis Rt lumbar spondylosis    Referring Provider (PT) Dr Dianah Field     Onset Date/Surgical Date 09/06/19    Hand Dominance Right    Next MD Visit PRN     Prior Therapy here for LBP       Palpation   Spinal mobility hypomobility lower lumbar spine with PA mobs     SI assessment  tightness and tenderness Rt lateral coccyx; Rt sacral torsion     Palpation comment muscular tightness through the Rt lumbar spine/lats/QL/piriformis/gluts/hamstrings                          [facility] Adult PT Treatment/Exercise - 11/19/19 0001      Therapeutic Activites    Therapeutic Activities Other Therapeutic  Activities    Other Therapeutic Activities myofacial ball release work sitting on small, soft ball to release tightness in the coccyx and sacral area ~ 1 min x 3 reps       Lumbar Exercises: Stretches   Press Ups 10 reps;5 seconds   press up w/ PT lumbar mob    Press Ups Limitations 2 sets with mobs in between     Other Lumbar Stretch Exercise neural mobilization supine x 10 reps     Other Lumbar Stretch Exercise lying over coregeous ball lumbar spine 1-2 min       Lumbar Exercises: Quadruped   Madcat/Old Horse 5 reps   2 sets second set post mobs and DN     Moist Heat Therapy   Number Minutes Moist Heat 15 Minutes    Moist Heat Location Lumbar Spine;Hip   Rt posterior hip      Manual Therapy   Manual Therapy Joint mobilization;Myofascial release;Soft tissue mobilization    Joint Mobilization correction of Rt sacral torsion with pressure through Rt sacrum; movement IR/ER LE; PA mobs lumbar spine Grade II,  R SI compression PA mobs grade II-III     Soft tissue mobilization deep tissue work through the Rt > Lt lumbar and posterior hip/buttocks into Rt hamstrings; working through the Rt coccyx area in palpable tightness    Myofascial Release Rt lumbar to posterior hip     Passive ROM stretching through the posterior hips/buttocks to release tightness in the tissues                        PT Long Term Goals - 11/16/19 0826      PT LONG TERM GOAL #1   Title Decrease LBP and Rt LE pain by 50-75% allowing patient to return to more normal functional activity level    Time 6    Period Weeks    Status Revised    Target Date 12/30/19      PT LONG TERM GOAL #2   Title Improve trunk mobility to WFL's and pain free    Time 6    Period Weeks    Status Revised    Target Date 12/30/19      PT LONG TERM GOAL #3   Title Patient to demonstrate core strengthening and stabilization program to decrease risk of recurrent LBP    Time 6    Period Weeks    Status Revised    Target Date  12/30/19      PT LONG TERM GOAL #4   Title The patient will  be able to sit in a chair for 30 min without R gluteal pain and without compensatory L leaning.    Baseline now sitting for 20 min    Time 6    Period Weeks    Status Revised    Target Date 12/30/19      PT LONG TERM GOAL #5   Title Independent in HEP    Time 6    Period Weeks    Status Revised    Target Date 12/30/19      PT LONG TERM GOAL #6   Title Improve FOTO to </= 30% limitation    Time 6    Period Weeks    Status Revised    Target Date 12/30/19                 Plan - 11/19/19 0848    Clinical Impression Statement Rt sacral torsion working on correction with pt in prone. Continued work on soft tissue tightness through the Rt coccyx area. Continued with joint mobs through the sacrum and lumbar spine with pt in prone. Patient reports improvement in symptoms and would like to continue with therapy to further resolve symptoms.    Rehab Potential Good    PT Frequency 2x / week    PT Duration 6 weeks    PT Treatment/Interventions Patient/family education;ADLs/Self Care Home Management;Aquatic Therapy;Cryotherapy;Electrical Stimulation;Iontophoresis 4mg /ml Dexamethasone;Moist Heat;Traction;Ultrasound;Functional mobility training;Therapeutic activities;Therapeutic exercise;Balance training;Neuromuscular re-education;Manual techniques;Dry needling;Taping    PT Next Visit Plan ** add written HEP focusing on HA length (consider askling protocol);  / isolated glut strengthening; review spine care principles; review HEP; address spinal mobility; teach hip hinge; assess response to DN(bilat QL and Rt posterior hip) and manual work and continue as indicated; modalities as indicated    PT [place] and Agree with Plan of Care Patient           Patient will benefit from skilled therapeutic intervention in order to improve the following deficits and  impairments:     Visit  Diagnosis: Acute right-sided low back pain with right-sided sciatica  Muscle weakness (generalized)  Other symptoms and signs involving the nervous system  Other symptoms and signs involving the musculoskeletal system  Acute right-sided low back pain with sciatica, sciatica laterality unspecified     Problem List Patient Active Problem List   Diagnosis Date Noted   B12 deficiency 02/27/2018   Lymphadenopathy 12/29/2017   History of melanoma 12/29/2017   Radiculitis of right cervical region 12/03/2017   Stress fracture of metatarsal bone of right foot 05/24/2017   Asthma, mild persistent 10/04/2015   Abnormal weight gain 01/20/2015   Tachycardia 05/31/2014   Recurrent UTI 05/06/2014   DUB (dysfunctional uterine bleeding) 05/06/2014   Lumbar spondylosis 04/02/2013   Migraine 02/13/2012   Major depression, recurrent ([place]) 12/14/2011   Hypothyroid 11/01/2010   PCOS (polycystic ovarian syndrome) 11/01/2010   Chronic right SI joint pain 11/01/2010   Polyarthritis with left third PIP osteoarthritis 11/01/2010   Melanoma ([place]) 11/01/2010   Essential hypertension, benign 11/01/2010   ANEMIA, IRON DEFICIENCY 10/17/2010   INSOMNIA, CHRONIC 10/17/2010   FATIGUE 10/17/2010   EDEMA 10/17/2010    Alicya Bena Nilda Simmer PT, MPH  11/19/2019, 1:20 PM  [facility] [place], [place], [address] Phone: [phone]   Fax:  [phone]  Name: Kataleyah Carducci MRN: [id number] Date of Birth: [birth year]

## 2019-11-25 ENCOUNTER — Other Ambulatory Visit: Payer: Self-pay

## 2019-11-25 ENCOUNTER — Ambulatory Visit (INDEPENDENT_AMBULATORY_CARE_PROVIDER_SITE_OTHER): Payer: BC Managed Care – PPO | Admitting: Rehabilitative and Restorative Service Providers"

## 2019-11-25 DIAGNOSIS — M6281 Muscle weakness (generalized): Secondary | ICD-10-CM | POA: Diagnosis not present

## 2019-11-25 DIAGNOSIS — R29898 Other symptoms and signs involving the musculoskeletal system: Secondary | ICD-10-CM

## 2019-11-25 DIAGNOSIS — M5441 Lumbago with sciatica, right side: Secondary | ICD-10-CM

## 2019-11-25 DIAGNOSIS — R29818 Other symptoms and signs involving the nervous system: Secondary | ICD-10-CM

## 2019-11-25 NOTE — Therapy (Signed)
[place], [place], [address] Phone: [phone]   Fax:  [phone]  Physical Therapy Treatment  Patient Details  Name: Carly [name] MRN: [id number] Date of Birth: [birth year] Referring Provider (PT): Dr Dianah Field    Encounter Date: 11/25/2019   PT End of Session - 11/25/19 0929    Visit Number 14    Number of Visits 24    Date for PT Re-Evaluation 12/30/19    PT Start Time 0846    PT Stop Time 0932    PT Time Calculation (min) 46 min    Activity Tolerance Patient tolerated treatment well    Behavior During Therapy [facility] for tasks assessed/performed           Past Medical History:  Diagnosis Date  . Arthritis   . Hypertension   . PCOS (polycystic ovarian syndrome)   . Thyroid disease   . TIA (transient ischemic attack)     Past Surgical History:  Procedure Laterality Date  . 2 ectopic pregnancies  [id number],9242  . lymph nodes    . melanoma removal     RT SIDE  . removal of fallopian tube  1999    There were no vitals filed for this visit.   Subjective Assessment - 11/25/19 0850    Subjective Patient did 10 minutes of core and 10 minutes of lower body workout today.  Pain is worst from sitting to standing after sitting >15-30 minutes.  She also avoids quick movements to pick items up from floor level.    Pertinent History LBP since 2005 treated with injections - injections since 2010; HTN; melenoma    Patient Stated Goals decrease tightness in hamstring    Currently in Pain? No/denies              [facility] PT Assessment - 11/25/19 0853      Assessment   Medical Diagnosis Rt lumbar spondylosis    Referring Provider (PT) Dr Dianah Field     Onset Date/Surgical Date 09/06/19    Hand Dominance Right    Next MD Visit PRN     Prior Therapy here for LBP                          [facility] Adult PT Treatment/Exercise - 11/25/19 0853      Exercises   Exercises  Knee/Hip;Lumbar      Lumbar Exercises: Stretches   Active Hamstring Stretch Right;30 seconds;1 rep      Knee/Hip Exercises: Standing   Hip Abduction Stengthening;Both;10 reps    Abduction Limitations green band initially, dec'd to red band with X band walk for glut  med    Lateral Step Up Right;5 reps;10 reps    Lateral Step Up Limitations Initially began wiht 5 reps.  Patient reported "irritating pain" in R hamstring musculature.  At end of session, she could tolerate 10 reps without pain.      Knee/Hip Exercises: Sidelying   Other Sidelying Knee/Hip Exercises Began with modified side plank and progressed to full plank x 3 reps R side.      Manual Therapy   Manual Therapy Soft tissue mobilization;Myofascial release    Manual therapy comments Skilled palpation to assess soft tissue response to DN and manual therapy; to reduce myofascial banding in glut med    Soft tissue mobilization R glut med, QL, and piriformis; along lateral border of sacrum     Myofascial Release R piriformis  Trigger Point Dry Needling - 11/25/19 1313    Consent Given? Yes    Education Handout Provided Previously provided    Muscles Treated Back/Hip Gluteus medius;Gluteus minimus    Gluteus Minimus Response Palpable increased muscle length    Gluteus Medius Response Palpable increased muscle length                PT Education - 11/25/19 0928    Education Details HEP modified side plank, x band walk    Person(s) Educated Patient    Methods Explanation;Demonstration;Handout    Comprehension Verbalized understanding;Returned demonstration               PT Long Term Goals - 11/16/19 0826      PT LONG TERM GOAL #1   Title Decrease LBP and Rt LE pain by 50-75% allowing patient to return to more normal functional activity level    Time 6    Period Weeks    Status Revised    Target Date 12/30/19      PT LONG TERM GOAL #2   Title Improve trunk mobility to WFL's and pain free     Time 6    Period Weeks    Status Revised    Target Date 12/30/19      PT LONG TERM GOAL #3   Title Patient to demonstrate core strengthening and stabilization program to decrease risk of recurrent LBP    Time 6    Period Weeks    Status Revised    Target Date 12/30/19      PT LONG TERM GOAL #4   Title The patient will  be able to sit in a chair for 30 min without R gluteal pain and without compensatory L leaning.    Baseline now sitting for 20 min    Time 6    Period Weeks    Status Revised    Target Date 12/30/19      PT LONG TERM GOAL #5   Title Independent in HEP    Time 6    Period Weeks    Status Revised    Target Date 12/30/19      PT LONG TERM GOAL #6   Title Improve FOTO to </= 30% limitation    Time 6    Period Weeks    Status Revised    Target Date 12/30/19                 Plan - 11/25/19 1307    Clinical Impression Statement The patient's pain is provoked with R lateral step up in R proximal hamstring musculature.  After glut med strengthening and DN to glut med, glut min, the patient is able to perform lateral step ups without pain.  PT added side plank and X band walk to HEP to progress stability.  Continue working towards [employer].    Rehab Potential Good    PT Frequency 2x / week    PT Duration 6 weeks    PT Treatment/Interventions Patient/family education;ADLs/Self Care Home Management;Aquatic Therapy;Cryotherapy;Electrical Stimulation;Iontophoresis 4mg /ml Dexamethasone;Moist Heat;Traction;Ultrasound;Functional mobility training;Therapeutic activities;Therapeutic exercise;Balance training;Neuromuscular re-education;Manual techniques;Dry needling;Taping    PT Next Visit Plan isolated glut strengthening; review spine care principles; review HEP; address spinal mobility; teach hip hinge; assess response to DN(bilat QL and Rt posterior hip) and manual work and continue as indicated; modalities as indicated    PT [place] and  Agree with Plan of Care Patient  Patient will benefit from skilled therapeutic intervention in order to improve the following deficits and impairments:  Increased fascial restricitons, Pain, Increased muscle spasms, Hypomobility, Impaired flexibility, Improper body mechanics, Decreased mobility, Decreased strength, Postural dysfunction  Visit Diagnosis: Acute right-sided low back pain with right-sided sciatica  Muscle weakness (generalized)  Other symptoms and signs involving the nervous system  Other symptoms and signs involving the musculoskeletal system     Problem List Patient Active Problem List   Diagnosis Date Noted  . B12 deficiency 02/27/2018  . Lymphadenopathy 12/29/2017  . History of melanoma 12/29/2017  . Radiculitis of right cervical region 12/03/2017  . Stress fracture of metatarsal bone of right foot 05/24/2017  . Asthma, mild persistent 10/04/2015  . Abnormal weight gain 01/20/2015  . Tachycardia 05/31/2014  . Recurrent UTI 05/06/2014  . DUB (dysfunctional uterine bleeding) 05/06/2014  . Lumbar spondylosis 04/02/2013  . Migraine 02/13/2012  . Major depression, recurrent ([place]) 12/14/2011  . Hypothyroid 11/01/2010  . PCOS (polycystic ovarian syndrome) 11/01/2010  . Chronic right SI joint pain 11/01/2010  . Polyarthritis with left third PIP osteoarthritis 11/01/2010  . Melanoma ([place]) 11/01/2010  . Essential hypertension, benign 11/01/2010  . ANEMIA, IRON DEFICIENCY 10/17/2010  . INSOMNIA, CHRONIC 10/17/2010  . FATIGUE 10/17/2010  . EDEMA 10/17/2010    Oyindamola Key, PT 11/25/2019, 1:15 PM  [facility] [place], [place], [address] Phone: [phone]   Fax:  [phone]  Name: Carly [name] MRN: [id number] Date of Birth: [birth year]

## 2019-11-25 NOTE — Patient Instructions (Signed)
Access Code: [email or web address] URL: [email or web address] Date: 11/25/2019 Prepared by: Rudell Cobb  Exercises Cat-Camel - 2 x daily - 7 x weekly - 1 sets - 5-10 reps - 5-10 sec hold Supine Sciatic Nerve Glide - 2 x daily - 7 x weekly - 1 sets - 10-15 reps - 1-2 sec hold Straight Leg Raise Nerve Flossing - 2 x daily - 7 x weekly - 1 sets - 10-15 reps - 1-2 sec hold Side Plank on Elbow - 2 x daily - 7 x weekly - 1 sets - 10 reps - 5 secnds hold X Band Walk - 2 x daily - 7 x weekly - 1 sets - 10 reps

## 2019-11-28 ENCOUNTER — Other Ambulatory Visit: Payer: Self-pay | Admitting: Sports Medicine

## 2019-11-28 ENCOUNTER — Other Ambulatory Visit: Payer: Self-pay | Admitting: Family Medicine

## 2019-11-28 DIAGNOSIS — M47816 Spondylosis without myelopathy or radiculopathy, lumbar region: Secondary | ICD-10-CM

## 2019-11-28 DIAGNOSIS — D510 Vitamin B12 deficiency anemia due to intrinsic factor deficiency: Secondary | ICD-10-CM

## 2019-12-02 ENCOUNTER — Encounter: Payer: Self-pay | Admitting: Rehabilitative and Restorative Service Providers"

## 2019-12-02 ENCOUNTER — Other Ambulatory Visit: Payer: Self-pay

## 2019-12-02 ENCOUNTER — Ambulatory Visit (INDEPENDENT_AMBULATORY_CARE_PROVIDER_SITE_OTHER): Payer: BC Managed Care – PPO | Admitting: Rehabilitative and Restorative Service Providers"

## 2019-12-02 DIAGNOSIS — M5441 Lumbago with sciatica, right side: Secondary | ICD-10-CM | POA: Diagnosis not present

## 2019-12-02 DIAGNOSIS — M5440 Lumbago with sciatica, unspecified side: Secondary | ICD-10-CM

## 2019-12-02 DIAGNOSIS — R29898 Other symptoms and signs involving the musculoskeletal system: Secondary | ICD-10-CM | POA: Diagnosis not present

## 2019-12-02 DIAGNOSIS — M6281 Muscle weakness (generalized): Secondary | ICD-10-CM | POA: Diagnosis not present

## 2019-12-02 DIAGNOSIS — R29818 Other symptoms and signs involving the nervous system: Secondary | ICD-10-CM | POA: Diagnosis not present

## 2019-12-02 NOTE — Therapy (Signed)
[place], [place], [address] Phone: [phone]   Fax:  [phone]  Physical Therapy Treatment  Patient Details  Name: Carly [name] MRN: [id number] Date of Birth: [birth year] Referring Provider (PT): Dr Dianah Field    Encounter Date: 12/02/2019   PT End of Session - 12/02/19 0807    Visit Number 15    Number of Visits 24    Date for PT Re-Evaluation 12/30/19    PT Start Time 0805    PT Stop Time 0900    PT Time Calculation (min) 55 min    Activity Tolerance Patient tolerated treatment well           Past Medical History:  Diagnosis Date  . Arthritis   . Hypertension   . PCOS (polycystic ovarian syndrome)   . Thyroid disease   . TIA (transient ischemic attack)     Past Surgical History:  Procedure Laterality Date  . 2 ectopic pregnancies  6962,9528  . lymph nodes    . melanoma removal     RT SIDE  . removal of fallopian tube  1999    There were no vitals filed for this visit.   Subjective Assessment - 12/02/19 0808    Subjective Sore and tight this morning. Played volleyball Monday night. May have irritated things. Added exercises from last visit. Generally pain has been 'duller" last week. Pain is better. She feels she is getting stronger. Doing planking and core work.    Currently in Pain? Yes    Pain Score 3     Pain Location Buttocks    Pain Orientation Right    Pain Descriptors / Indicators Tightness;Nagging    Pain Type Chronic pain              [facility] PT Assessment - 12/02/19 0001      Assessment   Medical Diagnosis Rt lumbar spondylosis    Referring Provider (PT) Dr Dianah Field     Onset Date/Surgical Date 09/06/19    Hand Dominance Right    Next MD Visit PRN     Prior Therapy here for LBP       AROM   Lumbar Flexion 85% pulling Rt posterior hip     Lumbar Extension 50% pain Rt buttock    Lumbar - Right Side Bend 95%    Lumbar - Left Side Bend 95%     Lumbar - Right Rotation 60%    Lumbar - Left Rotation 40% pain Rt buttock      Flexibility   Hamstrings tight Rt > Lt     Quadriceps tight Rt > Lt     ITB tight Rt > Lt     Piriformis tight Rt > Lt       Palpation   Spinal mobility hypomobility lower lumbar spine with PA mobs     SI assessment  tightness and tenderness Rt lateral coccyx; Rt sacral torsion     Palpation comment muscular tightness through the Rt lumbar spine/lats/QL/piriformis/gluts/hamstrings                          [facility] Adult PT Treatment/Exercise - 12/02/19 0001      Therapeutic Activites    Other Therapeutic Activities myofacial ball release work sitting on small, soft ball to release tightness in the coccyx and sacral area ~ 1 min x 3 reps       Lumbar Exercises: Prone   Other Prone  Lumbar Exercises myofacial release with glut set pt holding buttocks 5 sec hold x 5 reps       Lumbar Exercises: Quadruped   Madcat/Old Horse 5 reps      Moist Heat Therapy   Number Minutes Moist Heat 15 Minutes    Moist Heat Location Lumbar Spine;Hip      Manual Therapy   Manual therapy comments Skilled palpation to assess soft tissue response to DN and manual therapy; to reduce myofascial banding in glut med    Joint Mobilization PA mobs lumbar spine and Rt greater trochanter     Soft tissue mobilization R glut med, QL, and piriformis; along lateral border of sacrum; Rt lateral coccyx area             Trigger Point Dry Needling - 12/02/19 0001    Consent Given? Yes    Education Handout Provided Previously provided    Obturator internus Response Palpable increased muscle length                     PT Long Term Goals - 11/16/19 0826      PT LONG TERM GOAL #1   Title Decrease LBP and Rt LE pain by 50-75% allowing patient to return to more normal functional activity level    Time 6    Period Weeks    Status Revised    Target Date 12/30/19      PT LONG TERM GOAL #2   Title Improve  trunk mobility to WFL's and pain free    Time 6    Period Weeks    Status Revised    Target Date 12/30/19      PT LONG TERM GOAL #3   Title Patient to demonstrate core strengthening and stabilization program to decrease risk of recurrent LBP    Time 6    Period Weeks    Status Revised    Target Date 12/30/19      PT LONG TERM GOAL #4   Title The patient will  be able to sit in a chair for 30 min without R gluteal pain and without compensatory L leaning.    Baseline now sitting for 20 min    Time 6    Period Weeks    Status Revised    Target Date 12/30/19      PT LONG TERM GOAL #5   Title Independent in HEP    Time 6    Period Weeks    Status Revised    Target Date 12/30/19      PT LONG TERM GOAL #6   Title Improve FOTO to </= 30% limitation    Time 6    Period Weeks    Status Revised    Target Date 12/30/19                 Plan - 12/02/19 1307    Clinical Impression Statement Continued gradual improvement in posterior Rt hip and thigh pain. Patient is working on strengthening exercises consistently at home. She was able to play volleyball for a couple of hours Monday with minimal increase in symptoms. Patient continues to have muscular and ligamentous tightness through the Rt lateral coccyx area. Good response to DN and manual work in this area. Added myofacial release techniques for home for this tightness. Patient will benefit from continued PT to fully reslove symptoms and avoid recurrent spinal injections.    Rehab Potential Good    PT Frequency 2x /  week    PT Duration 6 weeks    PT Treatment/Interventions Patient/family education;ADLs/Self Care Home Management;Aquatic Therapy;Cryotherapy;Electrical Stimulation;Iontophoresis 4mg /ml Dexamethasone;Moist Heat;Traction;Ultrasound;Functional mobility training;Therapeutic activities;Therapeutic exercise;Balance training;Neuromuscular re-education;Manual techniques;Dry needling;Taping    PT Next Visit Plan isolated  glut strengthening; review spine care principles; review HEP; address spinal mobility; teach hip hinge; assess response to DN(Rt obtrator internus) and manual work and continue as indicated; modalities as indicated    PT [place] and Agree with Plan of Care Patient           Patient will benefit from skilled therapeutic intervention in order to improve the following deficits and impairments:     Visit Diagnosis: Acute right-sided low back pain with right-sided sciatica  Muscle weakness (generalized)  Other symptoms and signs involving the nervous system  Other symptoms and signs involving the musculoskeletal system  Acute right-sided low back pain with sciatica, sciatica laterality unspecified     Problem List Patient Active Problem List   Diagnosis Date Noted  . B12 deficiency 02/27/2018  . Lymphadenopathy 12/29/2017  . History of melanoma 12/29/2017  . Radiculitis of right cervical region 12/03/2017  . Stress fracture of metatarsal bone of right foot 05/24/2017  . Asthma, mild persistent 10/04/2015  . Abnormal weight gain 01/20/2015  . Tachycardia 05/31/2014  . Recurrent UTI 05/06/2014  . DUB (dysfunctional uterine bleeding) 05/06/2014  . Lumbar spondylosis 04/02/2013  . Migraine 02/13/2012  . Major depression, recurrent ([place]) 12/14/2011  . Hypothyroid 11/01/2010  . PCOS (polycystic ovarian syndrome) 11/01/2010  . Chronic right SI joint pain 11/01/2010  . Polyarthritis with left third PIP osteoarthritis 11/01/2010  . Melanoma ([place]) 11/01/2010  . Essential hypertension, benign 11/01/2010  . ANEMIA, IRON DEFICIENCY 10/17/2010  . INSOMNIA, CHRONIC 10/17/2010  . FATIGUE 10/17/2010  . EDEMA 10/17/2010    Jeronimo Hellberg Nilda Simmer PT, MPH  12/02/2019, 1:12 PM  [facility] [place], [place], [address] Phone: [phone]   Fax:  [phone]  Name: Carly [name] MRN:  [id number] Date of Birth: September 11, 1970

## 2019-12-06 ENCOUNTER — Encounter: Payer: Self-pay | Admitting: Family Medicine

## 2019-12-07 ENCOUNTER — Ambulatory Visit (INDEPENDENT_AMBULATORY_CARE_PROVIDER_SITE_OTHER): Payer: BC Managed Care – PPO | Admitting: Rehabilitative and Restorative Service Providers"

## 2019-12-07 ENCOUNTER — Other Ambulatory Visit: Payer: Self-pay | Admitting: *Deleted

## 2019-12-07 ENCOUNTER — Other Ambulatory Visit: Payer: Self-pay

## 2019-12-07 ENCOUNTER — Encounter: Payer: Self-pay | Admitting: Rehabilitative and Restorative Service Providers"

## 2019-12-07 DIAGNOSIS — M5441 Lumbago with sciatica, right side: Secondary | ICD-10-CM

## 2019-12-07 DIAGNOSIS — R29898 Other symptoms and signs involving the musculoskeletal system: Secondary | ICD-10-CM

## 2019-12-07 DIAGNOSIS — M5440 Lumbago with sciatica, unspecified side: Secondary | ICD-10-CM

## 2019-12-07 DIAGNOSIS — M6281 Muscle weakness (generalized): Secondary | ICD-10-CM | POA: Diagnosis not present

## 2019-12-07 DIAGNOSIS — R29818 Other symptoms and signs involving the nervous system: Secondary | ICD-10-CM

## 2019-12-07 MED ORDER — NITROFURANTOIN MONOHYD MACRO 100 MG PO CAPS: ORAL_CAPSULE | ORAL | 2 refills | Status: DC

## 2019-12-07 NOTE — Therapy (Signed)
[place], [place], [address] Phone: [phone]   Fax:  [phone]  Physical Therapy Treatment  Patient Details  Name: Carly [name] MRN: [id number] Date of Birth: 1970-07-03 Referring Provider (PT): Dr Dianah Field    Encounter Date: 12/07/2019   PT End of Session - 12/07/19 0811    Visit Number 16    Number of Visits 24    Date for PT Re-Evaluation 12/30/19    PT Start Time 0808    PT Stop Time 0902    PT Time Calculation (min) 54 min    Activity Tolerance Patient tolerated treatment well           Past Medical History:  Diagnosis Date  . Arthritis   . Hypertension   . PCOS (polycystic ovarian syndrome)   . Thyroid disease   . TIA (transient ischemic attack)     Past Surgical History:  Procedure Laterality Date  . 2 ectopic pregnancies  4709,6283  . lymph nodes    . melanoma removal     RT SIDE  . removal of fallopian tube  1999    There were no vitals filed for this visit.   Subjective Assessment - 12/07/19 0812    Subjective Not too sore from last treatment. Some soreness this am. Working on exercises    Currently in Pain? Yes    Pain Score 3     Pain Location Buttocks    Pain Orientation Right    Pain Descriptors / Indicators Tightness;Nagging    Pain Type Chronic pain                             [facility] Adult PT Treatment/Exercise - 12/07/19 0001      Lumbar Exercises: Prone   Other Prone Lumbar Exercises myofacial release with glut set pt holding buttocks 5 sec hold x 5 reps     Other Prone Lumbar Exercises plank extended arms 40 sec x2; forearms 30 sec x 2       Lumbar Exercises: Quadruped   Madcat/Old Horse 5 reps    Madcat/Old Horse Limitations * sign rechecking ROM following manual work and exercises -       Moist Heat Therapy   Number Minutes Moist Heat 15 Minutes    Moist Heat Location Lumbar Spine;Hip      Manual Therapy   Joint  Mobilization PA mobs lumbar spine and Rt greater trochanter     Soft tissue mobilization R obturator internus release work; glut med, QL, and piriformis; along lateral border of sacrum; Rt lateral coccyx area     Myofascial Release Rt posterior buttocks     Passive ROM stretching through the posterior hips/buttocks to release tightness in the tissues                        PT Long Term Goals - 11/16/19 0826      PT LONG TERM GOAL #1   Title Decrease LBP and Rt LE pain by 50-75% allowing patient to return to more normal functional activity level    Time 6    Period Weeks    Status Revised    Target Date 12/30/19      PT LONG TERM GOAL #2   Title Improve trunk mobility to WFL's and pain free    Time 6    Period Weeks    Status Revised  Target Date 12/30/19      PT LONG TERM GOAL #3   Title Patient to demonstrate core strengthening and stabilization program to decrease risk of recurrent LBP    Time 6    Period Weeks    Status Revised    Target Date 12/30/19      PT LONG TERM GOAL #4   Title The patient will  be able to sit in a chair for 30 min without R gluteal pain and without compensatory L leaning.    Baseline now sitting for 20 min    Time 6    Period Weeks    Status Revised    Target Date 12/30/19      PT LONG TERM GOAL #5   Title Independent in HEP    Time 6    Period Weeks    Status Revised    Target Date 12/30/19      PT LONG TERM GOAL #6   Title Improve FOTO to </= 30% limitation    Time 6    Period Weeks    Status Revised    Target Date 12/30/19                 Plan - 12/07/19 0845    Clinical Impression Statement some soreness - generally improving. Patient has good response to work through the obturator internus in prone and Rt sidelying. Patient was encouraged to work on release techniques for home. Notable improvement in cat cow following manual work.    Rehab Potential Good    PT Frequency 2x / week    PT Duration 6 weeks     PT Treatment/Interventions Patient/family education;ADLs/Self Care Home Management;Aquatic Therapy;Cryotherapy;Electrical Stimulation;Iontophoresis 4mg /ml Dexamethasone;Moist Heat;Traction;Ultrasound;Functional mobility training;Therapeutic activities;Therapeutic exercise;Balance training;Neuromuscular re-education;Manual techniques;Dry needling;Taping    PT Next Visit Plan isolated glut strengthening; review spine care principles; review HEP; address spinal mobility; teach hip hinge; assess response to DN(Rt obtrator internus) and manual work and continue as indicated; manual and release work through the obturator internus  modalities as indicated    PT [place] and Agree with Plan of Care Patient           Patient will benefit from skilled therapeutic intervention in order to improve the following deficits and impairments:     Visit Diagnosis: Acute right-sided low back pain with right-sided sciatica  Muscle weakness (generalized)  Other symptoms and signs involving the nervous system  Other symptoms and signs involving the musculoskeletal system  Acute right-sided low back pain with sciatica, sciatica laterality unspecified     Problem List Patient Active Problem List   Diagnosis Date Noted  . B12 deficiency 02/27/2018  . Lymphadenopathy 12/29/2017  . History of melanoma 12/29/2017  . Radiculitis of right cervical region 12/03/2017  . Stress fracture of metatarsal bone of right foot 05/24/2017  . Asthma, mild persistent 10/04/2015  . Abnormal weight gain 01/20/2015  . Tachycardia 05/31/2014  . Recurrent UTI 05/06/2014  . DUB (dysfunctional uterine bleeding) 05/06/2014  . Lumbar spondylosis 04/02/2013  . Migraine 02/13/2012  . Major depression, recurrent ([place]) 12/14/2011  . Hypothyroid 11/01/2010  . PCOS (polycystic ovarian syndrome) 11/01/2010  . Chronic right SI joint pain 11/01/2010  . Polyarthritis with left third PIP osteoarthritis  11/01/2010  . Melanoma ([place]) 11/01/2010  . Essential hypertension, benign 11/01/2010  . ANEMIA, IRON DEFICIENCY 10/17/2010  . INSOMNIA, CHRONIC 10/17/2010  . FATIGUE 10/17/2010  . EDEMA 10/17/2010    Mally Gavina P Helene Kelp  PT, MPH  12/07/2019, 8:48 AM  [facility] [place], [place], [address] Phone: [phone]   Fax:  [phone]  Name: Carly [name] MRN: [id number] Date of Birth: Oct 30, 1970

## 2019-12-09 ENCOUNTER — Ambulatory Visit (INDEPENDENT_AMBULATORY_CARE_PROVIDER_SITE_OTHER): Payer: BC Managed Care – PPO | Admitting: Rehabilitative and Restorative Service Providers"

## 2019-12-09 ENCOUNTER — Other Ambulatory Visit: Payer: Self-pay | Admitting: Family Medicine

## 2019-12-09 ENCOUNTER — Other Ambulatory Visit: Payer: Self-pay

## 2019-12-09 ENCOUNTER — Encounter: Payer: Self-pay | Admitting: Rehabilitative and Restorative Service Providers"

## 2019-12-09 DIAGNOSIS — M6281 Muscle weakness (generalized): Secondary | ICD-10-CM

## 2019-12-09 DIAGNOSIS — R29818 Other symptoms and signs involving the nervous system: Secondary | ICD-10-CM | POA: Diagnosis not present

## 2019-12-09 DIAGNOSIS — M5440 Lumbago with sciatica, unspecified side: Secondary | ICD-10-CM

## 2019-12-09 DIAGNOSIS — R29898 Other symptoms and signs involving the musculoskeletal system: Secondary | ICD-10-CM | POA: Diagnosis not present

## 2019-12-09 DIAGNOSIS — M5441 Lumbago with sciatica, right side: Secondary | ICD-10-CM | POA: Diagnosis not present

## 2019-12-09 NOTE — Therapy (Addendum)
[place], [place], [address] Phone: [phone]   Fax:  [phone]  Physical Therapy Treatment and Discharge Summary  Patient Details  Name: Carly [name] MRN: [id number] Date of Birth: 1970/02/22 Referring Provider (PT): Dr Dianah Field    Encounter Date: 12/09/2019   PT End of Session - 12/09/19 0805    Visit Number 17    Number of Visits 24    Date for PT Re-Evaluation 12/30/19    PT Start Time 0805    PT Stop Time 8588    PT Time Calculation (min) 49 min    Activity Tolerance Patient tolerated treatment well           Past Medical History:  Diagnosis Date  . Arthritis   . Hypertension   . PCOS (polycystic ovarian syndrome)   . Thyroid disease   . TIA (transient ischemic attack)     Past Surgical History:  Procedure Laterality Date  . 2 ectopic pregnancies  5027,7412  . lymph nodes    . melanoma removal     RT SIDE  . removal of fallopian tube  1999    There were no vitals filed for this visit.   Subjective Assessment - 12/09/19 0806    Subjective Patient reports that she was sore through the Rt buttock following last treatment. Just achy today - no pain    Currently in Pain? No/denies    Pain Score 0-No pain                             [facility] Adult PT Treatment/Exercise - 12/09/19 0001      Self-Care   Other Self-Care Comments  myofacial ball release work sitting on small yellow balll to release tightness       Neuro Re-ed    Neuro Re-ed Details  working on lumbar lordosis and neutral spine in functional activities and positions       Lumbar Exercises: [place] 10 reps;5 seconds   repeated post manual work and DN      Lumbar Exercises: Prone   Other Prone Lumbar Exercises myofacial release with glut set pt holding buttocks 5 sec hold x 5 reps     Other Prone Lumbar Exercises plank extended arms 40 sec x2; forearms 30 sec x 2        Lumbar Exercises: Quadruped   Madcat/Old Horse 5 reps    Madcat/Old Horse Limitations * sign rechecking ROM following manual work and exercises -       Moist Heat Therapy   Number Minutes Moist Heat 15 Minutes    Moist Heat Location Lumbar Spine;Hip      Manual Therapy   Joint Mobilization PA mobs lumbar spine and Rt greater trochanter     Soft tissue mobilization R obturator internus release work; glut med, QL, and piriformis; along lateral border of sacrum; Rt lateral coccyx area     Myofascial Release Rt posterior buttocks     Passive ROM stretching through the posterior hips/buttocks to release tightness in the tissues             Trigger Point Dry Needling - 12/09/19 0001    Consent Given? Yes    Education Handout Provided Previously provided    Lumbar multifidi Response Palpable increased muscle length    Obturator internus Response Palpable increased muscle length  PT Long Term Goals - 11/16/19 0826      PT LONG TERM GOAL #1   Title Decrease LBP and Rt LE pain by 50-75% allowing patient to return to more normal functional activity level    Time 6    Period Weeks    Status Revised    Target Date 12/30/19      PT LONG TERM GOAL #2   Title Improve trunk mobility to WFL's and pain free    Time 6    Period Weeks    Status Revised    Target Date 12/30/19      PT LONG TERM GOAL #3   Title Patient to demonstrate core strengthening and stabilization program to decrease risk of recurrent LBP    Time 6    Period Weeks    Status Revised    Target Date 12/30/19      PT LONG TERM GOAL #4   Title The patient will  be able to sit in a chair for 30 min without R gluteal pain and without compensatory L leaning.    Baseline now sitting for 20 min    Time 6    Period Weeks    Status Revised    Target Date 12/30/19      PT LONG TERM GOAL #5   Title Independent in HEP    Time 6    Period Weeks    Status Revised    Target Date 12/30/19       PT LONG TERM GOAL #6   Title Improve FOTO to </= 30% limitation    Time 6    Period Weeks    Status Revised    Target Date 12/30/19                 Plan - 12/09/19 3500    Clinical Impression Statement Positive response to work through the [employer] internus with decresaed pain - has had some soreness but moving better. Less palpable tightness through the posterior buttocks. Decreased radicular symptoms.    Rehab Potential Good    PT Frequency 2x / week    PT Duration 6 weeks    PT Treatment/Interventions Patient/family education;ADLs/Self Care Home Management;Aquatic Therapy;Cryotherapy;Electrical Stimulation;Iontophoresis 70m/ml Dexamethasone;Moist Heat;Traction;Ultrasound;Functional mobility training;Therapeutic activities;Therapeutic exercise;Balance training;Neuromuscular re-education;Manual techniques;Dry needling;Taping    PT Next Visit Plan isolated glut strengthening; review spine care principles; review HEP; address spinal mobility; teach hip hinge; continue DN(Rt obtrator internus) and manual work and continue as indicated; manual and release work through the obturator internus  modalities as indicated    PT [place] Agree with Plan of Care Patient           Patient will benefit from skilled therapeutic intervention in order to improve the following deficits and impairments:     Visit Diagnosis: Acute right-sided low back pain with right-sided sciatica  Muscle weakness (generalized)  Other symptoms and signs involving the nervous system  Other symptoms and signs involving the musculoskeletal system  Acute right-sided low back pain with sciatica, sciatica laterality unspecified     Problem List Patient Active Problem List   Diagnosis Date Noted  . B12 deficiency 02/27/2018  . Lymphadenopathy 12/29/2017  . History of melanoma 12/29/2017  . Radiculitis of right cervical region 12/03/2017  . Stress fracture of  metatarsal bone of right foot 05/24/2017  . Asthma, mild persistent 10/04/2015  . Abnormal weight gain 01/20/2015  . Tachycardia 05/31/2014  . Recurrent UTI 05/06/2014  .  DUB (dysfunctional uterine bleeding) 05/06/2014  . Lumbar spondylosis 04/02/2013  . Migraine 02/13/2012  . Major depression, recurrent ([place]) 12/14/2011  . Hypothyroid 11/01/2010  . PCOS (polycystic ovarian syndrome) 11/01/2010  . Chronic right SI joint pain 11/01/2010  . Polyarthritis with left third PIP osteoarthritis 11/01/2010  . Melanoma ([place]) 11/01/2010  . Essential hypertension, benign 11/01/2010  . ANEMIA, IRON DEFICIENCY 10/17/2010  . INSOMNIA, CHRONIC 10/17/2010  . FATIGUE 10/17/2010  . EDEMA 10/17/2010     PHYSICAL THERAPY DISCHARGE SUMMARY  Visits from Start of Care: 17  Current functional level related to goals / functional outcomes: See goals above   Remaining deficits: See note above for patient's last known status.  She called to d/c noting comfort with performing HEP.   Education / Equipment: Comprehensive HEP  Plan: Patient agrees to discharge.  Patient goals were partially met. Patient is being discharged due to meeting the stated rehab goals.  ?????        Thank you for the referral of this patient. Rudell Cobb, MPT  Keshanna Riso Nilda Simmer PT, MPH  12/09/2019, 8:43 AM  [facility] [place], [place], [address] Phone: [phone]   Fax:  [phone]  Name: Ithzel Fedorchak MRN: [id number] Date of Birth: 1970/05/19

## 2019-12-09 NOTE — Telephone Encounter (Signed)
Call patient remind her that she is overdue for 36-month follow-up especially for her medications including her sleep medicine which is a scheduled drug.  We can see if we can get her on the schedule either later this week or early next week.  Or after Thanksgiving if needed that is perfectly fine.  Just 1 to try to get her on sometime in the next 4 weeks if possible.

## 2019-12-10 NOTE — Telephone Encounter (Signed)
Patient scheduled.

## 2019-12-21 ENCOUNTER — Telehealth (INDEPENDENT_AMBULATORY_CARE_PROVIDER_SITE_OTHER): Payer: BC Managed Care – PPO | Admitting: Family Medicine

## 2019-12-21 ENCOUNTER — Encounter: Payer: Self-pay | Admitting: Family Medicine

## 2019-12-21 DIAGNOSIS — M5412 Radiculopathy, cervical region: Secondary | ICD-10-CM

## 2019-12-21 DIAGNOSIS — E039 Hypothyroidism, unspecified: Secondary | ICD-10-CM

## 2019-12-21 DIAGNOSIS — G4700 Insomnia, unspecified: Secondary | ICD-10-CM

## 2019-12-21 DIAGNOSIS — I10 Essential (primary) hypertension: Secondary | ICD-10-CM

## 2019-12-21 MED ORDER — ZOLPIDEM TARTRATE 10 MG PO TABS: 10.0000 mg | ORAL_TABLET | Freq: Every day | ORAL | 5 refills | Status: DC

## 2019-12-21 MED ORDER — HYDROCHLOROTHIAZIDE 12.5 MG PO TABS: 6.2500 mg | ORAL_TABLET | Freq: Every day | ORAL | 1 refills | Status: DC

## 2019-12-21 NOTE — Assessment & Plan Note (Signed)
New current regimen for now.  Will resend refills.  Discussed possible referral in the future to a sleep medicine specialist who is board certified.  Right now she wants to wait until after her rheumatology appointment in case they decide to put her on a specific treatment that might actually improve her joint pain.

## 2019-12-21 NOTE — Assessment & Plan Note (Signed)
Pressure looks fantastic in fact is a little borderline low.  She has been exercising has lost some weight and has made some major dietary changes.  Right now she already takes a half a tab of the hydrochlorothiazide so we discussed maybe holding it for a couple of weeks and checking her blood pressures at home to see if they still are well controlled.

## 2019-12-21 NOTE — Progress Notes (Signed)
Virtual Visit via Video Note  I connected with Carly [name] on 12/21/19 at  7:30 AM EST by a video enabled telemedicine application and verified that I am speaking with the correct person using two identifiers.   I discussed the limitations of evaluation and management by telemedicine and the availability of in person appointments. The patient expressed understanding and agreed to proceed.  Patient location: at home  Provider location: in office  Subjective:    CC: Insomnia and BP  HPI: Hypertension- Pt denies chest pain, SOB, dizziness, or heart palpitations.  Taking meds as directed w/o problems.  Denies medication side effects.    Follow-up chronic back pain-she has been going to physical therapy sessions for the last several months and getting dry needling she says it actually has been really helpful she has been starting to exercise more regularly and doing some interval training she is also cut out sodas.  She is really been working on stretching.  She says she does not have complete pain relief but it is better.  She did let me know that she also consulted with our sports medicine doc, Dr. Dianah Field and he is referring her to rheumatology at the end of the month it sounds like she has been getting some synovitis in the several joints.  Follow e up chronic insomnia-still struggling overall she already takes 10 mg of Ambien but says she still has difficulty falling asleep, usually takes about an hour.  She is sleep solidly for about 4 hours and then wake up.  Some nights she will get closer to 5 hours.  Sleep is disrupted by generalized body pain and muscle aches as well as night sweats.  She is still doing the sleep meditation and says it does help some nights but it is not always consistent.  Follow-up hypothyroidism-we recently adjusted her dose about 3 months ago she is currently taking 88 mcg daily she says that the least amount she is ever taken.  She was diagnosed as a  child.   She reports that she has had her Covid vaccine and had her flu shot in October.  Past medical history, Surgical history, Family history not pertinant except as noted below, Social history, Allergies, and medications have been entered into the medical record, reviewed, and corrections made.   Review of Systems: No fevers, chills, night sweats, weight loss, chest pain, or shortness of breath.   Objective:    General: Speaking clearly in complete sentences without any shortness of breath.  Alert and oriented x3.  Normal judgment. No apparent acute distress.    Impression and Recommendations:    Essential hypertension, benign Pressure looks fantastic in fact is a little borderline low.  She has been exercising has lost some weight and has made some major dietary changes.  Right now she already takes a half a tab of the hydrochlorothiazide so we discussed maybe holding it for a couple of weeks and checking her blood pressures at home to see if they still are well controlled.  Hypothyroid Has been on current regimen x3 months she says she will go to the lab this afternoon when she comes in for physical therapy she will need refills after that if we need to make an adjustment to her regimen.  INSOMNIA, CHRONIC New current regimen for now.  Will resend refills.  Discussed possible referral in the future to a sleep medicine specialist who is board certified.  Right now she wants to wait until after her rheumatology appointment  in case they decide to put her on a specific treatment that might actually improve her joint pain.  Radiculitis of right cervical region Continue with physical therapy which has been really helpful for her.  Meds ordered this encounter  Medications  . zolpidem (AMBIEN) 10 MG tablet    Sig: Take 1 tablet (10 mg total) by mouth at bedtime.    Dispense:  30 tablet    Refill:  5  . hydrochlorothiazide (HYDRODIURIL) 12.5 MG tablet    Sig: Take 0.5 tablets (6.25  mg total) by mouth daily.    Dispense:  45 tablet    Refill:  1     Reported flu shot done at work  Time spent in encounter 30 minutes  I discussed the assessment and treatment plan with the patient. The patient was provided an opportunity to ask questions and all were answered. The patient agreed with the plan and demonstrated an understanding of the instructions.   The patient was advised to call back or seek an in-person evaluation if the symptoms worsen or if the condition fails to improve as anticipated.   Beatrice Lecher, MD

## 2019-12-21 NOTE — Assessment & Plan Note (Signed)
Continue with physical therapy which has been really helpful for her.

## 2019-12-21 NOTE — Assessment & Plan Note (Signed)
Has been on current regimen x3 months she says she will go to the lab this afternoon when she comes in for physical therapy she will need refills after that if we need to make an adjustment to her regimen.

## 2019-12-22 LAB — TSH: TSH: 1.31 mIU/L

## 2019-12-24 ENCOUNTER — Encounter: Payer: Self-pay | Admitting: Family Medicine

## 2019-12-25 ENCOUNTER — Encounter: Payer: BC Managed Care – PPO | Admitting: Rehabilitative and Restorative Service Providers"

## 2019-12-25 MED ORDER — LEVOTHYROXINE SODIUM 88 MCG PO TABS
88.0000 ug | ORAL_TABLET | Freq: Every day | ORAL | 1 refills | Status: DC
Start: 2019-12-25 — End: 2020-07-08

## 2019-12-27 ENCOUNTER — Other Ambulatory Visit: Payer: Self-pay | Admitting: Family Medicine

## 2019-12-27 DIAGNOSIS — I10 Essential (primary) hypertension: Secondary | ICD-10-CM

## 2019-12-28 ENCOUNTER — Encounter: Payer: BC Managed Care – PPO | Admitting: Rehabilitative and Restorative Service Providers"

## 2019-12-29 DIAGNOSIS — H0288A Meibomian gland dysfunction right eye, upper and lower eyelids: Secondary | ICD-10-CM | POA: Diagnosis not present

## 2019-12-29 DIAGNOSIS — H0288B Meibomian gland dysfunction left eye, upper and lower eyelids: Secondary | ICD-10-CM | POA: Diagnosis not present

## 2020-01-10 ENCOUNTER — Other Ambulatory Visit: Payer: Self-pay | Admitting: Sports Medicine

## 2020-01-10 ENCOUNTER — Other Ambulatory Visit: Payer: Self-pay | Admitting: Family Medicine

## 2020-01-10 DIAGNOSIS — M47816 Spondylosis without myelopathy or radiculopathy, lumbar region: Secondary | ICD-10-CM

## 2020-01-10 DIAGNOSIS — G4700 Insomnia, unspecified: Secondary | ICD-10-CM

## 2020-01-19 DIAGNOSIS — M47812 Spondylosis without myelopathy or radiculopathy, cervical region: Secondary | ICD-10-CM | POA: Diagnosis not present

## 2020-01-19 DIAGNOSIS — M47816 Spondylosis without myelopathy or radiculopathy, lumbar region: Secondary | ICD-10-CM | POA: Diagnosis not present

## 2020-01-19 DIAGNOSIS — G8929 Other chronic pain: Secondary | ICD-10-CM | POA: Diagnosis not present

## 2020-01-19 DIAGNOSIS — M79642 Pain in left hand: Secondary | ICD-10-CM | POA: Diagnosis not present

## 2020-01-24 ENCOUNTER — Other Ambulatory Visit: Payer: Self-pay | Admitting: Family Medicine

## 2020-02-07 ENCOUNTER — Other Ambulatory Visit: Payer: Self-pay | Admitting: Family Medicine

## 2020-02-12 DIAGNOSIS — M47816 Spondylosis without myelopathy or radiculopathy, lumbar region: Secondary | ICD-10-CM

## 2020-02-12 NOTE — Telephone Encounter (Signed)
Spoke with Tye Maryland at [facility] giving her patient information to get patient scheduled.

## 2020-02-12 NOTE — Telephone Encounter (Signed)
Epidural order placed, please contact [facility] for scheduling 

## 2020-02-18 ENCOUNTER — Other Ambulatory Visit: Payer: BC Managed Care – PPO

## 2020-02-18 DIAGNOSIS — M79642 Pain in left hand: Secondary | ICD-10-CM | POA: Diagnosis not present

## 2020-02-18 DIAGNOSIS — G8929 Other chronic pain: Secondary | ICD-10-CM | POA: Diagnosis not present

## 2020-02-22 ENCOUNTER — Other Ambulatory Visit: Payer: Self-pay

## 2020-02-22 ENCOUNTER — Ambulatory Visit
Admission: RE | Admit: 2020-02-22 | Discharge: 2020-02-22 | Disposition: A | Discharge status: Home / Self Care | Payer: BC Managed Care – PPO | Source: Ambulatory Visit | Attending: Sports Medicine | Admitting: Sports Medicine

## 2020-02-22 DIAGNOSIS — M47817 Spondylosis without myelopathy or radiculopathy, lumbosacral region: Secondary | ICD-10-CM | POA: Diagnosis not present

## 2020-02-22 DIAGNOSIS — M47816 Spondylosis without myelopathy or radiculopathy, lumbar region: Secondary | ICD-10-CM

## 2020-02-22 MED ORDER — IOPAMIDOL (ISOVUE-M 200) INJECTION 41%
1.0000 mL | Freq: Once | INTRAMUSCULAR | Status: AC
  Administered 2020-02-22: 1 mL via EPIDURAL

## 2020-02-22 MED ORDER — METHYLPREDNISOLONE ACETATE 40 MG/ML INJ SUSP (RADIOLOG
120.0000 mg | Freq: Once | INTRAMUSCULAR | Status: AC
  Administered 2020-02-22: 120 mg via EPIDURAL

## 2020-02-22 NOTE — Discharge Instructions (Signed)

## 2020-03-01 ENCOUNTER — Other Ambulatory Visit: Payer: Self-pay | Admitting: Sports Medicine

## 2020-03-01 DIAGNOSIS — M47816 Spondylosis without myelopathy or radiculopathy, lumbar region: Secondary | ICD-10-CM

## 2020-03-30 DIAGNOSIS — M9901 Segmental and somatic dysfunction of cervical region: Secondary | ICD-10-CM | POA: Diagnosis not present

## 2020-04-02 ENCOUNTER — Other Ambulatory Visit: Payer: Self-pay | Admitting: Family Medicine

## 2020-04-02 ENCOUNTER — Other Ambulatory Visit: Payer: Self-pay | Admitting: Sports Medicine

## 2020-04-02 DIAGNOSIS — I10 Essential (primary) hypertension: Secondary | ICD-10-CM

## 2020-04-02 DIAGNOSIS — M47816 Spondylosis without myelopathy or radiculopathy, lumbar region: Secondary | ICD-10-CM

## 2020-04-04 DIAGNOSIS — M9903 Segmental and somatic dysfunction of lumbar region: Secondary | ICD-10-CM | POA: Diagnosis not present

## 2020-04-04 DIAGNOSIS — M9901 Segmental and somatic dysfunction of cervical region: Secondary | ICD-10-CM | POA: Diagnosis not present

## 2020-04-04 DIAGNOSIS — M530 Cervicocranial syndrome: Secondary | ICD-10-CM | POA: Diagnosis not present

## 2020-04-04 DIAGNOSIS — M9904 Segmental and somatic dysfunction of sacral region: Secondary | ICD-10-CM | POA: Diagnosis not present

## 2020-04-05 DIAGNOSIS — M530 Cervicocranial syndrome: Secondary | ICD-10-CM | POA: Diagnosis not present

## 2020-04-05 DIAGNOSIS — M9903 Segmental and somatic dysfunction of lumbar region: Secondary | ICD-10-CM | POA: Diagnosis not present

## 2020-04-05 DIAGNOSIS — M9901 Segmental and somatic dysfunction of cervical region: Secondary | ICD-10-CM | POA: Diagnosis not present

## 2020-04-05 DIAGNOSIS — M9904 Segmental and somatic dysfunction of sacral region: Secondary | ICD-10-CM | POA: Diagnosis not present

## 2020-04-07 DIAGNOSIS — M9901 Segmental and somatic dysfunction of cervical region: Secondary | ICD-10-CM | POA: Diagnosis not present

## 2020-04-07 DIAGNOSIS — M530 Cervicocranial syndrome: Secondary | ICD-10-CM | POA: Diagnosis not present

## 2020-04-07 DIAGNOSIS — M9903 Segmental and somatic dysfunction of lumbar region: Secondary | ICD-10-CM | POA: Diagnosis not present

## 2020-04-07 DIAGNOSIS — M9904 Segmental and somatic dysfunction of sacral region: Secondary | ICD-10-CM | POA: Diagnosis not present

## 2020-04-11 DIAGNOSIS — M9903 Segmental and somatic dysfunction of lumbar region: Secondary | ICD-10-CM | POA: Diagnosis not present

## 2020-04-11 DIAGNOSIS — M9901 Segmental and somatic dysfunction of cervical region: Secondary | ICD-10-CM | POA: Diagnosis not present

## 2020-04-11 DIAGNOSIS — M9904 Segmental and somatic dysfunction of sacral region: Secondary | ICD-10-CM | POA: Diagnosis not present

## 2020-04-11 DIAGNOSIS — M530 Cervicocranial syndrome: Secondary | ICD-10-CM | POA: Diagnosis not present

## 2020-04-12 DIAGNOSIS — M9903 Segmental and somatic dysfunction of lumbar region: Secondary | ICD-10-CM | POA: Diagnosis not present

## 2020-04-12 DIAGNOSIS — M9904 Segmental and somatic dysfunction of sacral region: Secondary | ICD-10-CM | POA: Diagnosis not present

## 2020-04-12 DIAGNOSIS — M9901 Segmental and somatic dysfunction of cervical region: Secondary | ICD-10-CM | POA: Diagnosis not present

## 2020-04-12 DIAGNOSIS — M530 Cervicocranial syndrome: Secondary | ICD-10-CM | POA: Diagnosis not present

## 2020-04-13 DIAGNOSIS — M530 Cervicocranial syndrome: Secondary | ICD-10-CM | POA: Diagnosis not present

## 2020-04-13 DIAGNOSIS — M9901 Segmental and somatic dysfunction of cervical region: Secondary | ICD-10-CM | POA: Diagnosis not present

## 2020-04-13 DIAGNOSIS — M9903 Segmental and somatic dysfunction of lumbar region: Secondary | ICD-10-CM | POA: Diagnosis not present

## 2020-04-13 DIAGNOSIS — M9904 Segmental and somatic dysfunction of sacral region: Secondary | ICD-10-CM | POA: Diagnosis not present

## 2020-04-14 ENCOUNTER — Encounter: Payer: Self-pay | Admitting: Family Medicine

## 2020-04-14 DIAGNOSIS — G4700 Insomnia, unspecified: Secondary | ICD-10-CM

## 2020-04-14 DIAGNOSIS — M530 Cervicocranial syndrome: Secondary | ICD-10-CM | POA: Diagnosis not present

## 2020-04-14 DIAGNOSIS — M9904 Segmental and somatic dysfunction of sacral region: Secondary | ICD-10-CM | POA: Diagnosis not present

## 2020-04-14 DIAGNOSIS — M9903 Segmental and somatic dysfunction of lumbar region: Secondary | ICD-10-CM | POA: Diagnosis not present

## 2020-04-14 DIAGNOSIS — M9901 Segmental and somatic dysfunction of cervical region: Secondary | ICD-10-CM | POA: Diagnosis not present

## 2020-04-15 MED ORDER — ZOLPIDEM TARTRATE 10 MG PO TABS: 10.0000 mg | ORAL_TABLET | Freq: Every day | ORAL | 2 refills | Status: DC

## 2020-04-15 NOTE — Telephone Encounter (Signed)
rx sent

## 2020-04-21 DIAGNOSIS — M9904 Segmental and somatic dysfunction of sacral region: Secondary | ICD-10-CM | POA: Diagnosis not present

## 2020-04-21 DIAGNOSIS — M9903 Segmental and somatic dysfunction of lumbar region: Secondary | ICD-10-CM | POA: Diagnosis not present

## 2020-04-21 DIAGNOSIS — M9901 Segmental and somatic dysfunction of cervical region: Secondary | ICD-10-CM | POA: Diagnosis not present

## 2020-04-21 DIAGNOSIS — M530 Cervicocranial syndrome: Secondary | ICD-10-CM | POA: Diagnosis not present

## 2020-04-25 DIAGNOSIS — M9903 Segmental and somatic dysfunction of lumbar region: Secondary | ICD-10-CM | POA: Diagnosis not present

## 2020-04-25 DIAGNOSIS — M9901 Segmental and somatic dysfunction of cervical region: Secondary | ICD-10-CM | POA: Diagnosis not present

## 2020-04-25 DIAGNOSIS — M530 Cervicocranial syndrome: Secondary | ICD-10-CM | POA: Diagnosis not present

## 2020-04-25 DIAGNOSIS — M9904 Segmental and somatic dysfunction of sacral region: Secondary | ICD-10-CM | POA: Diagnosis not present

## 2020-04-26 DIAGNOSIS — M9901 Segmental and somatic dysfunction of cervical region: Secondary | ICD-10-CM | POA: Diagnosis not present

## 2020-04-26 DIAGNOSIS — M9903 Segmental and somatic dysfunction of lumbar region: Secondary | ICD-10-CM | POA: Diagnosis not present

## 2020-04-26 DIAGNOSIS — M9904 Segmental and somatic dysfunction of sacral region: Secondary | ICD-10-CM | POA: Diagnosis not present

## 2020-04-26 DIAGNOSIS — M530 Cervicocranial syndrome: Secondary | ICD-10-CM | POA: Diagnosis not present

## 2020-04-27 DIAGNOSIS — M9901 Segmental and somatic dysfunction of cervical region: Secondary | ICD-10-CM | POA: Diagnosis not present

## 2020-04-27 DIAGNOSIS — M530 Cervicocranial syndrome: Secondary | ICD-10-CM | POA: Diagnosis not present

## 2020-04-27 DIAGNOSIS — M9903 Segmental and somatic dysfunction of lumbar region: Secondary | ICD-10-CM | POA: Diagnosis not present

## 2020-04-27 DIAGNOSIS — M9904 Segmental and somatic dysfunction of sacral region: Secondary | ICD-10-CM | POA: Diagnosis not present

## 2020-04-28 DIAGNOSIS — M9904 Segmental and somatic dysfunction of sacral region: Secondary | ICD-10-CM | POA: Diagnosis not present

## 2020-04-28 DIAGNOSIS — M530 Cervicocranial syndrome: Secondary | ICD-10-CM | POA: Diagnosis not present

## 2020-04-28 DIAGNOSIS — M9901 Segmental and somatic dysfunction of cervical region: Secondary | ICD-10-CM | POA: Diagnosis not present

## 2020-04-28 DIAGNOSIS — M9903 Segmental and somatic dysfunction of lumbar region: Secondary | ICD-10-CM | POA: Diagnosis not present

## 2020-05-02 DIAGNOSIS — M9903 Segmental and somatic dysfunction of lumbar region: Secondary | ICD-10-CM | POA: Diagnosis not present

## 2020-05-02 DIAGNOSIS — M9904 Segmental and somatic dysfunction of sacral region: Secondary | ICD-10-CM | POA: Diagnosis not present

## 2020-05-02 DIAGNOSIS — M530 Cervicocranial syndrome: Secondary | ICD-10-CM | POA: Diagnosis not present

## 2020-05-02 DIAGNOSIS — M9901 Segmental and somatic dysfunction of cervical region: Secondary | ICD-10-CM | POA: Diagnosis not present

## 2020-05-03 DIAGNOSIS — M9903 Segmental and somatic dysfunction of lumbar region: Secondary | ICD-10-CM | POA: Diagnosis not present

## 2020-05-03 DIAGNOSIS — M530 Cervicocranial syndrome: Secondary | ICD-10-CM | POA: Diagnosis not present

## 2020-05-03 DIAGNOSIS — M9901 Segmental and somatic dysfunction of cervical region: Secondary | ICD-10-CM | POA: Diagnosis not present

## 2020-05-03 DIAGNOSIS — M9904 Segmental and somatic dysfunction of sacral region: Secondary | ICD-10-CM | POA: Diagnosis not present

## 2020-05-05 DIAGNOSIS — M530 Cervicocranial syndrome: Secondary | ICD-10-CM | POA: Diagnosis not present

## 2020-05-05 DIAGNOSIS — M9904 Segmental and somatic dysfunction of sacral region: Secondary | ICD-10-CM | POA: Diagnosis not present

## 2020-05-05 DIAGNOSIS — M9903 Segmental and somatic dysfunction of lumbar region: Secondary | ICD-10-CM | POA: Diagnosis not present

## 2020-05-05 DIAGNOSIS — M9901 Segmental and somatic dysfunction of cervical region: Secondary | ICD-10-CM | POA: Diagnosis not present

## 2020-05-10 DIAGNOSIS — M9903 Segmental and somatic dysfunction of lumbar region: Secondary | ICD-10-CM | POA: Diagnosis not present

## 2020-05-10 DIAGNOSIS — M9901 Segmental and somatic dysfunction of cervical region: Secondary | ICD-10-CM | POA: Diagnosis not present

## 2020-05-10 DIAGNOSIS — M9904 Segmental and somatic dysfunction of sacral region: Secondary | ICD-10-CM | POA: Diagnosis not present

## 2020-05-10 DIAGNOSIS — M530 Cervicocranial syndrome: Secondary | ICD-10-CM | POA: Diagnosis not present

## 2020-05-11 DIAGNOSIS — M9903 Segmental and somatic dysfunction of lumbar region: Secondary | ICD-10-CM | POA: Diagnosis not present

## 2020-05-11 DIAGNOSIS — M9904 Segmental and somatic dysfunction of sacral region: Secondary | ICD-10-CM | POA: Diagnosis not present

## 2020-05-11 DIAGNOSIS — M9901 Segmental and somatic dysfunction of cervical region: Secondary | ICD-10-CM | POA: Diagnosis not present

## 2020-05-11 DIAGNOSIS — M530 Cervicocranial syndrome: Secondary | ICD-10-CM | POA: Diagnosis not present

## 2020-05-12 DIAGNOSIS — M9904 Segmental and somatic dysfunction of sacral region: Secondary | ICD-10-CM | POA: Diagnosis not present

## 2020-05-12 DIAGNOSIS — M530 Cervicocranial syndrome: Secondary | ICD-10-CM | POA: Diagnosis not present

## 2020-05-12 DIAGNOSIS — M9901 Segmental and somatic dysfunction of cervical region: Secondary | ICD-10-CM | POA: Diagnosis not present

## 2020-05-12 DIAGNOSIS — M9903 Segmental and somatic dysfunction of lumbar region: Secondary | ICD-10-CM | POA: Diagnosis not present

## 2020-05-16 ENCOUNTER — Other Ambulatory Visit: Payer: Self-pay | Admitting: Family Medicine

## 2020-05-16 ENCOUNTER — Other Ambulatory Visit: Payer: Self-pay | Admitting: Sports Medicine

## 2020-05-16 DIAGNOSIS — M47816 Spondylosis without myelopathy or radiculopathy, lumbar region: Secondary | ICD-10-CM

## 2020-05-17 DIAGNOSIS — M530 Cervicocranial syndrome: Secondary | ICD-10-CM | POA: Diagnosis not present

## 2020-05-17 DIAGNOSIS — M9904 Segmental and somatic dysfunction of sacral region: Secondary | ICD-10-CM | POA: Diagnosis not present

## 2020-05-17 DIAGNOSIS — M9901 Segmental and somatic dysfunction of cervical region: Secondary | ICD-10-CM | POA: Diagnosis not present

## 2020-05-17 DIAGNOSIS — M9903 Segmental and somatic dysfunction of lumbar region: Secondary | ICD-10-CM | POA: Diagnosis not present

## 2020-05-24 DIAGNOSIS — M9901 Segmental and somatic dysfunction of cervical region: Secondary | ICD-10-CM | POA: Diagnosis not present

## 2020-05-24 DIAGNOSIS — M9904 Segmental and somatic dysfunction of sacral region: Secondary | ICD-10-CM | POA: Diagnosis not present

## 2020-05-24 DIAGNOSIS — M530 Cervicocranial syndrome: Secondary | ICD-10-CM | POA: Diagnosis not present

## 2020-05-24 DIAGNOSIS — M9903 Segmental and somatic dysfunction of lumbar region: Secondary | ICD-10-CM | POA: Diagnosis not present

## 2020-05-30 DIAGNOSIS — M9901 Segmental and somatic dysfunction of cervical region: Secondary | ICD-10-CM | POA: Diagnosis not present

## 2020-05-30 DIAGNOSIS — M530 Cervicocranial syndrome: Secondary | ICD-10-CM | POA: Diagnosis not present

## 2020-05-30 DIAGNOSIS — M9903 Segmental and somatic dysfunction of lumbar region: Secondary | ICD-10-CM | POA: Diagnosis not present

## 2020-05-30 DIAGNOSIS — M9904 Segmental and somatic dysfunction of sacral region: Secondary | ICD-10-CM | POA: Diagnosis not present

## 2020-06-01 NOTE — Telephone Encounter (Signed)
We are going to need a least a telephone visit/virtual visit, okay to do in person as well to evaluate sites of nerve compressions and further management.

## 2020-06-06 ENCOUNTER — Ambulatory Visit (INDEPENDENT_AMBULATORY_CARE_PROVIDER_SITE_OTHER): Payer: BC Managed Care – PPO

## 2020-06-06 ENCOUNTER — Ambulatory Visit (INDEPENDENT_AMBULATORY_CARE_PROVIDER_SITE_OTHER): Payer: BC Managed Care – PPO | Admitting: Sports Medicine

## 2020-06-06 ENCOUNTER — Other Ambulatory Visit: Payer: Self-pay

## 2020-06-06 DIAGNOSIS — M533 Sacrococcygeal disorders, not elsewhere classified: Secondary | ICD-10-CM | POA: Diagnosis not present

## 2020-06-06 DIAGNOSIS — M47816 Spondylosis without myelopathy or radiculopathy, lumbar region: Secondary | ICD-10-CM

## 2020-06-06 DIAGNOSIS — G8929 Other chronic pain: Secondary | ICD-10-CM

## 2020-06-06 NOTE — Assessment & Plan Note (Signed)
Historically right L5-S1 interlaminar epidurals have done well, the most recent did not provide good efficacy, some of her pain does sound discogenic, we injected her right SI joint today, if insufficient improvement we will proceed with an updated lumbar spine MRI.

## 2020-06-06 NOTE — Progress Notes (Signed)
    Procedures performed today:    Procedure: Real-time Ultrasound Guided injection of the right sacroiliac joint Device: Samsung HS60  Verbal informed consent obtained.  Time-out conducted.  Noted no overlying erythema, induration, or other signs of local infection.  Skin prepped in a sterile fashion.  Local anesthesia: Topical Ethyl chloride.  With sterile technique and under real time ultrasound guidance:  Noted normal-appearing SI joint and taking care to avoid inadvertent injection into the S1 foramen, 1 cc Kenalog 40, 2 cc lidocaine, 2 cc bupivacaine injected easily Completed without difficulty  Advised to call if fevers/chills, erythema, induration, drainage, or persistent bleeding.  Images permanently stored and available for review in PACS.  Impression: Technically successful ultrasound guided injection.  Independent interpretation of notes and tests performed by another provider:   None.  Brief History, Exam, Impression, and Recommendations:    Chronic right SI joint pain Carly [name] returns, she is a pleasant 50 year old female with multifactorial low back pain, historically right L5-S1 interlaminar epidurals have provided good relief. In the distant past she has also had good relief from a right sacroiliac joint injection. Unfortunately [name] most recent epidural did not provide any relief of [name] right-sided low back pain with radiation into the buttock and thigh. On exam she does have significant tenderness in the right sacroiliac joint today, this was injected today, return to see me in 1 month as needed.   Lumbar spondylosis Historically right L5-S1 interlaminar epidurals have done well, the most recent did not provide good efficacy, some of [name] pain does sound discogenic, we injected [name] right SI joint today, if insufficient improvement we will proceed with an updated lumbar spine MRI.    ___________________________________________ Carly [name]. Dianah Field, M.D., ABFM.,  CAQSM. Primary Care and [place] Instructor of [place] [facility] of [facility]

## 2020-06-06 NOTE — Assessment & Plan Note (Addendum)
Carly [name] returns, she is a pleasant 50 year old female with multifactorial low back pain, historically right L5-S1 interlaminar epidurals have provided good relief. In the distant past she has also had good relief from a right sacroiliac joint injection. Unfortunately her most recent epidural did not provide any relief of her right-sided low back pain with radiation into the buttock and thigh. On exam she does have significant tenderness in the right sacroiliac joint today, this was injected today, return to see me in 1 month as needed.

## 2020-06-11 ENCOUNTER — Encounter (INDEPENDENT_AMBULATORY_CARE_PROVIDER_SITE_OTHER): Payer: BC Managed Care – PPO

## 2020-06-11 DIAGNOSIS — M47816 Spondylosis without myelopathy or radiculopathy, lumbar region: Secondary | ICD-10-CM | POA: Diagnosis not present

## 2020-06-13 ENCOUNTER — Ambulatory Visit (INDEPENDENT_AMBULATORY_CARE_PROVIDER_SITE_OTHER): Payer: BC Managed Care – PPO

## 2020-06-13 ENCOUNTER — Other Ambulatory Visit: Payer: Self-pay

## 2020-06-13 DIAGNOSIS — M5430 Sciatica, unspecified side: Secondary | ICD-10-CM

## 2020-06-13 DIAGNOSIS — M545 Low back pain, unspecified: Secondary | ICD-10-CM

## 2020-06-13 DIAGNOSIS — M47816 Spondylosis without myelopathy or radiculopathy, lumbar region: Secondary | ICD-10-CM | POA: Diagnosis not present

## 2020-06-13 DIAGNOSIS — G8929 Other chronic pain: Secondary | ICD-10-CM

## 2020-06-13 NOTE — Assessment & Plan Note (Addendum)
Carly [name] returns, we tried a right sacroiliac joint injection last week, unfortunately she did not get good relief, she does have lumbar spondylosis and has done well with right L5-S1 interlaminar epidurals, the last MRI was over 4 years ago. Overall she has had greater than 6 weeks of conservative treatment. We will get updated x-rays followed by updated MRI for epidural planning.  Update 06/21/2020: MRI shows right L5-S1 disc protrusion impinging the right S1 nerve root, S1 selective epidural ordered, return to see me 1 month after injection.

## 2020-06-13 NOTE — Telephone Encounter (Signed)
I spent 5 total minutes of online digital evaluation and management services. 

## 2020-06-14 ENCOUNTER — Other Ambulatory Visit: Payer: Self-pay | Admitting: Family Medicine

## 2020-06-14 DIAGNOSIS — I10 Essential (primary) hypertension: Secondary | ICD-10-CM

## 2020-06-14 DIAGNOSIS — D510 Vitamin B12 deficiency anemia due to intrinsic factor deficiency: Secondary | ICD-10-CM

## 2020-06-14 MED ORDER — GABAPENTIN 800 MG PO TABS: 800.0000 mg | ORAL_TABLET | Freq: Three times a day (TID) | ORAL | 3 refills | Status: DC

## 2020-06-14 MED ORDER — TRAMADOL HCL 50 MG PO TABS: 50.0000 mg | ORAL_TABLET | Freq: Three times a day (TID) | ORAL | 0 refills | Status: DC | PRN

## 2020-06-14 NOTE — Telephone Encounter (Signed)
FYI, I did order her MRI, x-rays show L5-S1 degenerative disc disease with retrolisthesis.

## 2020-06-14 NOTE — Addendum Note (Signed)
Addended by: Silverio Decamp on: 06/14/2020 01:29 PM   Modules accepted: Orders

## 2020-06-15 NOTE — Telephone Encounter (Signed)
Please call pt and advised her that she will need a f/u with Dr. Madilyn Fireman for bp

## 2020-06-15 NOTE — Telephone Encounter (Signed)
Spoke with patient and she was in a meeting and said she would call back to get this appt scheduled. AM

## 2020-06-17 NOTE — Telephone Encounter (Signed)
Called patient again trying to get appointment scheduled. Left voicemail. AM

## 2020-06-18 ENCOUNTER — Other Ambulatory Visit: Payer: Self-pay

## 2020-06-18 ENCOUNTER — Ambulatory Visit (INDEPENDENT_AMBULATORY_CARE_PROVIDER_SITE_OTHER): Payer: BC Managed Care – PPO

## 2020-06-18 DIAGNOSIS — M47816 Spondylosis without myelopathy or radiculopathy, lumbar region: Secondary | ICD-10-CM

## 2020-06-18 DIAGNOSIS — M4726 Other spondylosis with radiculopathy, lumbar region: Secondary | ICD-10-CM | POA: Diagnosis not present

## 2020-06-18 DIAGNOSIS — M5116 Intervertebral disc disorders with radiculopathy, lumbar region: Secondary | ICD-10-CM | POA: Diagnosis not present

## 2020-06-18 DIAGNOSIS — M5117 Intervertebral disc disorders with radiculopathy, lumbosacral region: Secondary | ICD-10-CM | POA: Diagnosis not present

## 2020-06-18 DIAGNOSIS — M2578 Osteophyte, vertebrae: Secondary | ICD-10-CM | POA: Diagnosis not present

## 2020-06-21 NOTE — Addendum Note (Signed)
Addended by: Silverio Decamp on: 06/21/2020 02:27 PM   Modules accepted: Orders

## 2020-06-22 ENCOUNTER — Ambulatory Visit
Admission: RE | Admit: 2020-06-22 | Discharge: 2020-06-22 | Disposition: A | Discharge status: Home / Self Care | Payer: BC Managed Care – PPO | Source: Ambulatory Visit | Attending: Sports Medicine | Admitting: Sports Medicine

## 2020-06-22 ENCOUNTER — Other Ambulatory Visit: Payer: BC Managed Care – PPO

## 2020-06-22 DIAGNOSIS — M47816 Spondylosis without myelopathy or radiculopathy, lumbar region: Secondary | ICD-10-CM

## 2020-06-22 DIAGNOSIS — M5126 Other intervertebral disc displacement, lumbar region: Secondary | ICD-10-CM | POA: Diagnosis not present

## 2020-06-22 DIAGNOSIS — M47817 Spondylosis without myelopathy or radiculopathy, lumbosacral region: Secondary | ICD-10-CM | POA: Diagnosis not present

## 2020-06-22 MED ORDER — IOPAMIDOL (ISOVUE-M 200) INJECTION 41%
1.0000 mL | Freq: Once | INTRAMUSCULAR | Status: AC
  Administered 2020-06-22: 1 mL via EPIDURAL

## 2020-06-22 MED ORDER — METHYLPREDNISOLONE ACETATE 40 MG/ML INJ SUSP (RADIOLOG
80.0000 mg | Freq: Once | INTRAMUSCULAR | Status: AC
  Administered 2020-06-22: 80 mg via EPIDURAL

## 2020-06-22 NOTE — Discharge Instructions (Signed)
Post Procedure Spinal Discharge Instruction Sheet  1. You may resume a regular diet and any medications that you routinely take (including pain medications).  2. No driving day of procedure.  3. Light activity throughout the rest of the day.  Do not do any strenuous work, exercise, bending or lifting.  The day following the procedure, you can resume normal physical activity but you should refrain from exercising or physical therapy for at least three days thereafter.   Common Side Effects:   Headaches- take your usual medications as directed by your physician.  Increase your fluid intake.  Caffeinated beverages may be helpful.  Lie flat in bed until your headache resolves.   Restlessness or inability to sleep- you may have trouble sleeping for the next few days.  Ask your referring physician if you need any medication for sleep.   Facial flushing or redness- should subside within a few days.   Increased pain- a temporary increase in pain a day or two following your procedure is not unusual.  Take your pain medication as prescribed by your referring physician.   Leg cramps  Please contact our office at [phone] for the following symptoms:  Fever greater than 100 degrees.  Headaches unresolved with medication after 2-3 days.  Increased swelling, pain, or redness at injection site.   Thank you for visiting [facility] today.  

## 2020-07-02 ENCOUNTER — Other Ambulatory Visit: Payer: Self-pay | Admitting: Family Medicine

## 2020-07-04 DIAGNOSIS — M9901 Segmental and somatic dysfunction of cervical region: Secondary | ICD-10-CM | POA: Diagnosis not present

## 2020-07-04 DIAGNOSIS — M4728 Other spondylosis with radiculopathy, sacral and sacrococcygeal region: Secondary | ICD-10-CM | POA: Diagnosis not present

## 2020-07-04 DIAGNOSIS — M5441 Lumbago with sciatica, right side: Secondary | ICD-10-CM | POA: Diagnosis not present

## 2020-07-04 DIAGNOSIS — M530 Cervicocranial syndrome: Secondary | ICD-10-CM | POA: Diagnosis not present

## 2020-07-05 DIAGNOSIS — M4728 Other spondylosis with radiculopathy, sacral and sacrococcygeal region: Secondary | ICD-10-CM | POA: Diagnosis not present

## 2020-07-05 DIAGNOSIS — M9901 Segmental and somatic dysfunction of cervical region: Secondary | ICD-10-CM | POA: Diagnosis not present

## 2020-07-05 DIAGNOSIS — M5441 Lumbago with sciatica, right side: Secondary | ICD-10-CM | POA: Diagnosis not present

## 2020-07-05 DIAGNOSIS — M530 Cervicocranial syndrome: Secondary | ICD-10-CM | POA: Diagnosis not present

## 2020-07-08 ENCOUNTER — Other Ambulatory Visit: Payer: Self-pay | Admitting: *Deleted

## 2020-07-08 DIAGNOSIS — I10 Essential (primary) hypertension: Secondary | ICD-10-CM

## 2020-07-08 DIAGNOSIS — E039 Hypothyroidism, unspecified: Secondary | ICD-10-CM

## 2020-07-08 NOTE — Telephone Encounter (Signed)
Patient has been scheduled for Dr Gardiner Ramus next available, 07/28/20 and she said if she needed to have blood work done prior to this, she is available to do so. AM

## 2020-07-08 NOTE — Telephone Encounter (Signed)
Please call pt for f/u visit for bp and thyroid. thanks

## 2020-07-08 NOTE — Telephone Encounter (Signed)
Left voicemail advising patient of information below. AM

## 2020-07-12 NOTE — Addendum Note (Signed)
Addended by: Silverio Decamp on: 07/12/2020 04:39 PM   Modules accepted: Orders

## 2020-07-14 ENCOUNTER — Ambulatory Visit
Admission: RE | Admit: 2020-07-14 | Discharge: 2020-07-14 | Disposition: A | Discharge status: Home / Self Care | Payer: BC Managed Care – PPO | Source: Ambulatory Visit | Attending: Sports Medicine | Admitting: Sports Medicine

## 2020-07-14 ENCOUNTER — Other Ambulatory Visit: Payer: Self-pay

## 2020-07-14 DIAGNOSIS — M47817 Spondylosis without myelopathy or radiculopathy, lumbosacral region: Secondary | ICD-10-CM | POA: Diagnosis not present

## 2020-07-14 DIAGNOSIS — M47816 Spondylosis without myelopathy or radiculopathy, lumbar region: Secondary | ICD-10-CM

## 2020-07-14 MED ORDER — METHYLPREDNISOLONE ACETATE 40 MG/ML INJ SUSP (RADIOLOG
80.0000 mg | Freq: Once | INTRAMUSCULAR | Status: AC
  Administered 2020-07-14: 80 mg via EPIDURAL

## 2020-07-14 MED ORDER — IOPAMIDOL (ISOVUE-M 200) INJECTION 41%
1.0000 mL | Freq: Once | INTRAMUSCULAR | Status: AC
  Administered 2020-07-14: 1 mL via EPIDURAL

## 2020-07-14 NOTE — Discharge Instructions (Signed)

## 2020-07-18 DIAGNOSIS — M530 Cervicocranial syndrome: Secondary | ICD-10-CM | POA: Diagnosis not present

## 2020-07-18 DIAGNOSIS — M5441 Lumbago with sciatica, right side: Secondary | ICD-10-CM | POA: Diagnosis not present

## 2020-07-18 DIAGNOSIS — M9901 Segmental and somatic dysfunction of cervical region: Secondary | ICD-10-CM | POA: Diagnosis not present

## 2020-07-18 DIAGNOSIS — M4728 Other spondylosis with radiculopathy, sacral and sacrococcygeal region: Secondary | ICD-10-CM | POA: Diagnosis not present

## 2020-07-22 ENCOUNTER — Other Ambulatory Visit: Payer: Self-pay | Admitting: Family Medicine

## 2020-07-22 DIAGNOSIS — G4700 Insomnia, unspecified: Secondary | ICD-10-CM

## 2020-07-22 NOTE — Telephone Encounter (Signed)
Med sent. Needs= f/u appt

## 2020-07-22 NOTE — Telephone Encounter (Signed)
Last written 04/15/2020 #30 with 2 rfs Last appt 12/21/2019

## 2020-07-22 NOTE — Telephone Encounter (Signed)
Please call patient to schedule follow up

## 2020-07-26 DIAGNOSIS — Z1151 Encounter for screening for human papillomavirus (HPV): Secondary | ICD-10-CM | POA: Diagnosis not present

## 2020-07-26 DIAGNOSIS — Z01411 Encounter for gynecological examination (general) (routine) with abnormal findings: Secondary | ICD-10-CM | POA: Diagnosis not present

## 2020-07-26 DIAGNOSIS — N6311 Unspecified lump in the right breast, upper outer quadrant: Secondary | ICD-10-CM | POA: Diagnosis not present

## 2020-07-26 DIAGNOSIS — Z9079 Acquired absence of other genital organ(s): Secondary | ICD-10-CM | POA: Diagnosis not present

## 2020-07-26 DIAGNOSIS — Z79899 Other long term (current) drug therapy: Secondary | ICD-10-CM | POA: Diagnosis not present

## 2020-07-26 DIAGNOSIS — I10 Essential (primary) hypertension: Secondary | ICD-10-CM | POA: Diagnosis not present

## 2020-07-26 DIAGNOSIS — N946 Dysmenorrhea, unspecified: Secondary | ICD-10-CM | POA: Diagnosis not present

## 2020-07-26 DIAGNOSIS — Z8759 Personal history of other complications of pregnancy, childbirth and the puerperium: Secondary | ICD-10-CM | POA: Diagnosis not present

## 2020-07-26 DIAGNOSIS — E039 Hypothyroidism, unspecified: Secondary | ICD-10-CM | POA: Diagnosis not present

## 2020-07-26 DIAGNOSIS — Z8742 Personal history of other diseases of the female genital tract: Secondary | ICD-10-CM | POA: Diagnosis not present

## 2020-07-26 DIAGNOSIS — E282 Polycystic ovarian syndrome: Secondary | ICD-10-CM | POA: Diagnosis not present

## 2020-07-26 DIAGNOSIS — Z7984 Long term (current) use of oral hypoglycemic drugs: Secondary | ICD-10-CM | POA: Diagnosis not present

## 2020-07-26 NOTE — Telephone Encounter (Signed)
Patient has an appointment scheduled for 07/29/20. AM

## 2020-07-28 LAB — HM PAP SMEAR: HM Pap smear: NEGATIVE

## 2020-07-28 LAB — RESULTS CONSOLE HPV: CHL HPV: NEGATIVE

## 2020-07-29 ENCOUNTER — Encounter: Payer: BC Managed Care – PPO | Admitting: Family Medicine

## 2020-08-01 DIAGNOSIS — M5441 Lumbago with sciatica, right side: Secondary | ICD-10-CM | POA: Diagnosis not present

## 2020-08-01 DIAGNOSIS — M4728 Other spondylosis with radiculopathy, sacral and sacrococcygeal region: Secondary | ICD-10-CM | POA: Diagnosis not present

## 2020-08-01 DIAGNOSIS — M530 Cervicocranial syndrome: Secondary | ICD-10-CM | POA: Diagnosis not present

## 2020-08-01 DIAGNOSIS — M9901 Segmental and somatic dysfunction of cervical region: Secondary | ICD-10-CM | POA: Diagnosis not present

## 2020-08-11 ENCOUNTER — Encounter: Payer: Self-pay | Admitting: Family Medicine

## 2020-08-11 ENCOUNTER — Other Ambulatory Visit: Payer: Self-pay

## 2020-08-11 ENCOUNTER — Ambulatory Visit (INDEPENDENT_AMBULATORY_CARE_PROVIDER_SITE_OTHER): Payer: BC Managed Care – PPO | Admitting: Family Medicine

## 2020-08-11 VITALS — BP 107/69 | HR 78 | Ht 64.0 in | Wt 134.0 lb

## 2020-08-11 DIAGNOSIS — I10 Essential (primary) hypertension: Secondary | ICD-10-CM | POA: Diagnosis not present

## 2020-08-11 DIAGNOSIS — R790 Abnormal level of blood mineral: Secondary | ICD-10-CM | POA: Diagnosis not present

## 2020-08-11 DIAGNOSIS — N6489 Other specified disorders of breast: Secondary | ICD-10-CM | POA: Diagnosis not present

## 2020-08-11 DIAGNOSIS — E039 Hypothyroidism, unspecified: Secondary | ICD-10-CM

## 2020-08-11 DIAGNOSIS — Z1231 Encounter for screening mammogram for malignant neoplasm of breast: Secondary | ICD-10-CM | POA: Diagnosis not present

## 2020-08-11 DIAGNOSIS — R413 Other amnesia: Secondary | ICD-10-CM | POA: Insufficient documentation

## 2020-08-11 DIAGNOSIS — Z23 Encounter for immunization: Secondary | ICD-10-CM | POA: Diagnosis not present

## 2020-08-11 DIAGNOSIS — G4700 Insomnia, unspecified: Secondary | ICD-10-CM | POA: Diagnosis not present

## 2020-08-11 DIAGNOSIS — N6311 Unspecified lump in the right breast, upper outer quadrant: Secondary | ICD-10-CM | POA: Diagnosis not present

## 2020-08-11 NOTE — Assessment & Plan Note (Signed)
Well controlled. Continue current regimen. Follow up in  6 mo  Due foe labs.

## 2020-08-11 NOTE — Assessment & Plan Note (Signed)
Ambien does not seem to be working quite as well as it was in the past.  She says she is quit watching TV before bedtime and she has been trying to read to, decompress her brain so she has been falling asleep a little bit more easily but she has been waking up in the not being able to get back to sleep we did discuss the option of maybe switching to a different medication since she has been on the Ambien for quite a long time just encouraged her to consider it and if at any point she would like to make a change just let me know.

## 2020-08-11 NOTE — Progress Notes (Signed)
Established Patient Office Visit  Subjective:  Patient ID: Carly [name], female    DOB: 02/22/1970  Age: 50 y.o. MRN: [id number]  CC:  Chief Complaint  Patient presents with   Hypothyroidism   Hypertension    HPI Mikala Podoll presents for   Hypertension- Pt denies chest pain, SOB, dizziness, or heart palpitations.  Taking meds as directed w/o problems.  Denies medication side effects.    Hypothyroidism - Taking medication regularly in the AM away from food and vitamins, etc. No recent change to skin, hair, or energy levels.  F/U insomnia - doing Ok on her [place].  She hast cut out TV before bedtime  She was worried about her memory.  She does have a stressful job and travels a lot but is just noticed that she is not as sharp as she used to be and remembering things even paying attention during conversations she says sometimes she will leave notes all over the place and that she does not like her.  She has not been as organized at work and again its very much not like her.  Past Medical History:  Diagnosis Date   Arthritis    Hypertension    PCOS (polycystic ovarian syndrome)    Thyroid disease    TIA (transient ischemic attack)     Past Surgical History:  Procedure Laterality Date   2 ectopic pregnancies  3557,3220   lymph nodes     melanoma removal     RT SIDE   removal of fallopian tube  1999    Family History  Problem Relation Age of Onset   Melanoma Mother    Depression Mother    Hyperlipidemia Mother    Colon cancer Maternal Grandmother    Colon cancer Father 109       deceased.     Social History   Socioeconomic History   Marital status: Married    Spouse name: Herbie [place]   Number of children: 3   Years of education: Not on file   Highest education level: Not on file  Occupational History   Occupation: [employer]    Employer: [place]: [employer].   Tobacco Use   Smoking status: Never   Smokeless tobacco: Never   Vaping Use   Vaping Use: Never used  Substance and Sexual Activity   Alcohol use: No   Drug use: No   Sexual activity: Yes    Partners: Male    Comment: adopted  Other Topics Concern   Not on file  Social History Narrative   Caffeine daily. No regular exercise.  She was adopted.    Social Determinants of Health   Financial Resource Strain: Not on file  Food Insecurity: Not on file  Transportation Needs: Not on file  Physical Activity: Not on file  Stress: Not on file  Social Connections: Not on file  Intimate Partner Violence: Not on file    Outpatient Medications Prior to Visit  Medication Sig Dispense Refill   cyanocobalamin (,VITAMIN B-12,) 1000 MCG/ML injection INJECT 1ML INTO THE MUSCLE EVERY 21 DAYS 2 mL 0   fluticasone (FLONASE) 50 MCG/ACT nasal spray Place 2 sprays into the nose daily as needed. For seasonal allergy nasal congestion 16 g 1   gabapentin (NEURONTIN) 800 MG tablet Take 1 tablet (800 mg total) by mouth 3 (three) times daily. 270 tablet 3   hydrochlorothiazide (HYDRODIURIL) 12.5 MG tablet Take 1/2 (one-half) tablet by mouth once daily  45 tablet 0   levonorgestrel (MIRENA) 20 MCG/24HR IUD 1 Intra Uterine Device (1 each total) by Intrauterine route once. Inserted 10/19/14 1 each 0   losartan (COZAAR) 25 MG tablet Take 1 tablet by mouth once daily 90 tablet 0   metFORMIN (GLUCOPHAGE) 1000 MG tablet TAKE 1 TABLET BY MOUTH TWICE DAILY WITH MEALS 180 tablet 0   nitrofurantoin, macrocrystal-monohydrate, (MACROBID) 100 MG capsule Take one tablet after intercourse to prevent UTI's. 30 capsule 2   omeprazole (PRILOSEC) 40 MG capsule Take 1 capsule by mouth twice daily 90 capsule 0   spironolactone (ALDACTONE) 25 MG tablet Take 1 tablet by mouth once daily 90 tablet 0   SYNTHROID 88 MCG tablet Take 1 tablet by mouth once daily 90 tablet 0   SYRINGE-NEEDLE, DISP, 3 ML 23G X 1" 3 ML MISC by Does not apply route.     zolpidem (AMBIEN) 10 MG tablet TAKE 1 TABLET BY MOUTH  AT BEDTIME 30 tablet 0   traMADol (ULTRAM) 50 MG tablet Take 1-2 tablets (50-100 mg total) by mouth every 8 (eight) hours as needed for moderate pain. Maximum 6 tabs per day. 21 tablet 0   No facility-administered medications prior to visit.    Allergies  Allergen Reactions   Cymbalta [Duloxetine Hcl] Other (See Comments)    Jolting/jerking sensations.    Sulfonamide Derivatives Hives   Tape Rash    Use paper tape    ROS Review of Systems    Objective:    Physical Exam Constitutional:      Appearance: Normal appearance. She is well-developed.  HENT:     Head: Normocephalic and atraumatic.  Cardiovascular:     Rate and Rhythm: Normal rate and regular rhythm.     Heart sounds: Normal heart sounds.  Pulmonary:     Effort: Pulmonary effort is normal.     Breath sounds: Normal breath sounds.  Skin:    General: Skin is warm and dry.  Neurological:     Mental Status: She is alert and oriented to person, place, and time.  Psychiatric:        Behavior: Behavior normal.    BP 107/69   Pulse 78   Ht 5\' 4"  (1.626 m)   Wt 134 lb (60.8 kg)   SpO2 99%   BMI 23.00 kg/m  Wt Readings from Last 3 Encounters:  08/11/20 134 lb (60.8 kg)  12/21/19 137 lb (62.1 kg)  12/02/18 144 lb (65.3 kg)     Health Maintenance Due  Topic Date Due   Pneumococcal Vaccine 9-4 Years old (1 - PCV) Never done   HIV Screening  Never done   Hepatitis C Screening  Never done   Zoster Vaccines- Shingrix (1 of 2) Never done   MAMMOGRAM  02/22/2017   COVID-19 Vaccine (3 - Moderna risk series) 03/18/2019    There are no preventive care reminders to display for this patient.  Lab Results  Component Value Date   TSH 1.31 12/21/2019   Lab Results  Component Value Date   WBC 6.9 09/24/2019   HGB 12.3 09/24/2019   HCT 35.7 09/24/2019   MCV 88.8 09/24/2019   PLT 381 09/24/2019   Lab Results  Component Value Date   NA 137 04/17/2019   K 3.9 04/17/2019   CO2 28 04/17/2019   GLUCOSE 89  04/17/2019   BUN 13 04/17/2019   CREATININE 1.06 04/17/2019   BILITOT 0.4 07/10/2018   ALKPHOS 36 09/11/2016   AST 13 07/10/2018  ALT 11 07/10/2018   PROT 6.9 07/10/2018   ALBUMIN 4.2 09/11/2016   CALCIUM 9.5 04/17/2019   Lab Results  Component Value Date   CHOL 180 07/10/2018   Lab Results  Component Value Date   HDL 46 (L) 07/10/2018   Lab Results  Component Value Date   LDLCALC 116 (H) 07/10/2018   Lab Results  Component Value Date   TRIG 84 07/10/2018   Lab Results  Component Value Date   CHOLHDL 3.9 07/10/2018   Lab Results  Component Value Date   HGBA1C 5.1 04/17/2019      Assessment & Plan:   Problem List Items Addressed This Visit       Cardiovascular and Mediastinum   Essential hypertension, benign - Primary    Well controlled. Continue current regimen. Follow up in  6 mo  Due foe labs.         Relevant Orders   CBC   Lipid panel   COMPLETE METABOLIC PANEL WITH GFR   TSH     Endocrine   Hypothyroid    Due to recheck TSH.         Relevant Orders   CBC   Lipid panel   COMPLETE METABOLIC PANEL WITH GFR   TSH     Other   Memory impairment    Unclear etiology at this point she does have a very high stress job and does struggle with sleep.  We discussed that sometimes memory problems can stem from underlying mood disorder and also just having a lot of things that are pulling her multiple directions that make it very difficult to focus in the moment we discussed, try to monitor that over the next couple of weeks but if she feels like it still continuing to be a struggle then I am more than happy to refer her to neuropsychiatry for further evaluation.  She also has a history of low iron so we will check that as well again today to make sure that that could not be contributing.       INSOMNIA, CHRONIC    Ambien does not seem to be working quite as well as it was in the past.  She says she is quit watching TV before bedtime and she has been  trying to read to, decompress her brain so she has been falling asleep a little bit more easily but she has been waking up in the not being able to get back to sleep we did discuss the option of maybe switching to a different medication since she has been on the Ambien for quite a long time just encouraged her to consider it and if at any point she would like to make a change just let me know.       Other Visit Diagnoses     Low ferritin       Relevant Orders   Ferritin   Need for tetanus, diphtheria, and acellular pertussis (Tdap) vaccine in patient of adolescent age or older       Relevant Orders   Tdap vaccine greater than or equal to 7yo IM (Completed)      She had a Pap smear earlier this month we will get that abstracted into the chart.  She has a mammogram scheduled for this afternoon.  No orders of the defined types were placed in this encounter.   Follow-up: Return in about 6 months (around 02/11/2021) for Hypertension.    Beatrice Lecher, MD

## 2020-08-11 NOTE — Assessment & Plan Note (Signed)
Unclear etiology at this point she does have a very high stress job and does struggle with sleep.  We discussed that sometimes memory problems can stem from underlying mood disorder and also just having a lot of things that are pulling her multiple directions that make it very difficult to focus in the moment we discussed, try to monitor that over the next couple of weeks but if she feels like it still continuing to be a struggle then I am more than happy to refer her to neuropsychiatry for further evaluation.  She also has a history of low iron so we will check that as well again today to make sure that that could not be contributing.

## 2020-08-11 NOTE — Progress Notes (Signed)
0707 

## 2020-08-11 NOTE — Assessment & Plan Note (Signed)
Due to recheck TSH. 

## 2020-08-19 DIAGNOSIS — I10 Essential (primary) hypertension: Secondary | ICD-10-CM | POA: Diagnosis not present

## 2020-08-19 DIAGNOSIS — R790 Abnormal level of blood mineral: Secondary | ICD-10-CM | POA: Diagnosis not present

## 2020-08-19 DIAGNOSIS — E039 Hypothyroidism, unspecified: Secondary | ICD-10-CM | POA: Diagnosis not present

## 2020-08-19 LAB — CBC
HCT: 37.9 % (ref 35.0–45.0)
Hemoglobin: 12.6 g/dL (ref 11.7–15.5)
MCH: 29.9 pg (ref 27.0–33.0)
MCHC: 33.2 g/dL (ref 32.0–36.0)
MCV: 89.8 fL (ref 80.0–100.0)
MPV: 9.1 fL (ref 7.5–12.5)
Platelets: 372 10*3/uL (ref 140–400)
RBC: 4.22 10*6/uL (ref 3.80–5.10)
RDW: 13.2 % (ref 11.0–15.0)
WBC: 7.3 10*3/uL (ref 3.8–10.8)

## 2020-08-19 LAB — LIPID PANEL
Cholesterol: 179 mg/dL (ref <200)
HDL: 48 mg/dL — ABNORMAL LOW (ref >=50)
LDL Cholesterol (Calc): 116 mg/dL (calc) — ABNORMAL HIGH
Non-HDL Cholesterol (Calc): 131 mg/dL (calc) — ABNORMAL HIGH (ref <130)
Total CHOL/HDL Ratio: 3.7 (calc) (ref <5.0)
Triglycerides: 56 mg/dL (ref <150)

## 2020-08-19 LAB — COMPLETE METABOLIC PANEL WITH GFR
AG Ratio: 1.6 (calc) (ref 1.0–2.5)
ALT: 11 U/L (ref 6–29)
AST: 12 U/L (ref 10–35)
Albumin: 4.1 g/dL (ref 3.6–5.1)
Alkaline phosphatase (APISO): 46 U/L (ref 37–153)
BUN/Creatinine Ratio: 13 (calc) (ref 6–22)
BUN: 15 mg/dL (ref 7–25)
CO2: 29 mmol/L (ref 20–32)
Calcium: 9.2 mg/dL (ref 8.6–10.4)
Chloride: 101 mmol/L (ref 98–110)
Creat: 1.15 mg/dL — ABNORMAL HIGH (ref 0.50–1.03)
Globulin: 2.5 g/dL (calc) (ref 1.9–3.7)
Glucose, Bld: 72 mg/dL (ref 65–99)
Potassium: 3.7 mmol/L (ref 3.5–5.3)
Sodium: 138 mmol/L (ref 135–146)
Total Bilirubin: 0.3 mg/dL (ref 0.2–1.2)
Total Protein: 6.6 g/dL (ref 6.1–8.1)
eGFR: 58 mL/min/{1.73_m2} — ABNORMAL LOW (ref >=60)

## 2020-08-19 LAB — FERRITIN: Ferritin: 15 ng/mL — ABNORMAL LOW (ref 16–232)

## 2020-08-19 LAB — TSH: TSH: 1.65 mIU/L

## 2020-08-22 ENCOUNTER — Other Ambulatory Visit: Payer: Self-pay | Admitting: Family Medicine

## 2020-08-22 ENCOUNTER — Encounter: Payer: Self-pay | Admitting: Family Medicine

## 2020-08-22 DIAGNOSIS — G4700 Insomnia, unspecified: Secondary | ICD-10-CM

## 2020-08-25 ENCOUNTER — Encounter: Payer: Self-pay | Admitting: Family Medicine

## 2020-08-25 DIAGNOSIS — G4700 Insomnia, unspecified: Secondary | ICD-10-CM

## 2020-08-26 MED ORDER — CLONAZEPAM 0.5 MG PO TABS: 0.2500 mg | ORAL_TABLET | Freq: Every day | ORAL | 0 refills | Status: DC | PRN

## 2020-08-26 MED ORDER — ZOLPIDEM TARTRATE 10 MG PO TABS: 10.0000 mg | ORAL_TABLET | Freq: Every day | ORAL | 1 refills | Status: DC

## 2020-08-30 ENCOUNTER — Other Ambulatory Visit: Payer: Self-pay | Admitting: *Deleted

## 2020-08-30 DIAGNOSIS — G4700 Insomnia, unspecified: Secondary | ICD-10-CM

## 2020-09-25 ENCOUNTER — Other Ambulatory Visit: Payer: Self-pay | Admitting: Family Medicine

## 2020-09-25 DIAGNOSIS — I10 Essential (primary) hypertension: Secondary | ICD-10-CM

## 2020-09-27 DIAGNOSIS — M4728 Other spondylosis with radiculopathy, sacral and sacrococcygeal region: Secondary | ICD-10-CM | POA: Diagnosis not present

## 2020-09-27 DIAGNOSIS — M9901 Segmental and somatic dysfunction of cervical region: Secondary | ICD-10-CM | POA: Diagnosis not present

## 2020-09-27 DIAGNOSIS — M530 Cervicocranial syndrome: Secondary | ICD-10-CM | POA: Diagnosis not present

## 2020-09-27 DIAGNOSIS — M5441 Lumbago with sciatica, right side: Secondary | ICD-10-CM | POA: Diagnosis not present

## 2020-10-09 ENCOUNTER — Other Ambulatory Visit: Payer: Self-pay | Admitting: Family Medicine

## 2020-10-24 ENCOUNTER — Other Ambulatory Visit: Payer: Self-pay | Admitting: Family Medicine

## 2020-10-24 DIAGNOSIS — G4700 Insomnia, unspecified: Secondary | ICD-10-CM

## 2020-11-02 ENCOUNTER — Ambulatory Visit (INDEPENDENT_AMBULATORY_CARE_PROVIDER_SITE_OTHER): Payer: BC Managed Care – PPO | Admitting: Sports Medicine

## 2020-11-02 ENCOUNTER — Encounter: Payer: Self-pay | Admitting: Sports Medicine

## 2020-11-02 DIAGNOSIS — J329 Chronic sinusitis, unspecified: Secondary | ICD-10-CM | POA: Diagnosis not present

## 2020-11-02 DIAGNOSIS — J40 Bronchitis, not specified as acute or chronic: Secondary | ICD-10-CM | POA: Insufficient documentation

## 2020-11-02 MED ORDER — PREDNISONE 50 MG PO TABS: 50.0000 mg | ORAL_TABLET | Freq: Every day | ORAL | 0 refills | Status: DC

## 2020-11-02 MED ORDER — AZITHROMYCIN 250 MG PO TABS: ORAL_TABLET | ORAL | 0 refills | Status: DC

## 2020-11-02 NOTE — Assessment & Plan Note (Signed)
This is a pleasant 50 year old female, more recently for the past week and a half she has had increasing muscle aches, body aches, sore throat, runny nose, cough. She did a COVID test that was negative, has not yet done a flu test. I really do not see the utility of a flu test as it will not change the management at this point, she had her flu shot on the third of this month. Exam is for the most part unremarkable. Suspect due to duration of symptoms we will do prednisone and azithromycin, return to see me as needed.

## 2020-11-02 NOTE — Progress Notes (Signed)
    Procedures performed today:    None.  Independent interpretation of notes and tests performed by another provider:   None.  Brief History, Exam, Impression, and Recommendations:    Sinobronchitis This is a pleasant 50 year old female, more recently for the past week and a half she has had increasing muscle aches, body aches, sore throat, runny nose, cough. She did a COVID test that was negative, has not yet done a flu test. I really do not see the utility of a flu test as it will not change the management at this point, she had her flu shot on the third of this month. Exam is for the most part unremarkable. Suspect due to duration of symptoms we will do prednisone and azithromycin, return to see me as needed.    ___________________________________________ Gwen Her. Dianah Field, M.D., ABFM., CAQSM. Primary Care and [place] Instructor of [place] of [facility] of Medicine

## 2020-11-06 DIAGNOSIS — J329 Chronic sinusitis, unspecified: Secondary | ICD-10-CM

## 2020-11-07 MED ORDER — AMOXICILLIN-POT CLAVULANATE 875-125 MG PO TABS: 1.0000 | ORAL_TABLET | Freq: Two times a day (BID) | ORAL | 0 refills | Status: AC

## 2020-11-08 ENCOUNTER — Ambulatory Visit: Payer: BC Managed Care – PPO | Admitting: Physician Assistant

## 2020-11-13 ENCOUNTER — Other Ambulatory Visit: Payer: Self-pay | Admitting: Family Medicine

## 2020-11-14 DIAGNOSIS — M4728 Other spondylosis with radiculopathy, sacral and sacrococcygeal region: Secondary | ICD-10-CM | POA: Diagnosis not present

## 2020-11-14 DIAGNOSIS — M9901 Segmental and somatic dysfunction of cervical region: Secondary | ICD-10-CM | POA: Diagnosis not present

## 2020-11-14 DIAGNOSIS — M5441 Lumbago with sciatica, right side: Secondary | ICD-10-CM | POA: Diagnosis not present

## 2020-11-14 DIAGNOSIS — M530 Cervicocranial syndrome: Secondary | ICD-10-CM | POA: Diagnosis not present

## 2020-11-16 ENCOUNTER — Ambulatory Visit (INDEPENDENT_AMBULATORY_CARE_PROVIDER_SITE_OTHER): Payer: BC Managed Care – PPO | Admitting: Sports Medicine

## 2020-11-16 ENCOUNTER — Other Ambulatory Visit: Payer: Self-pay

## 2020-11-16 ENCOUNTER — Ambulatory Visit (INDEPENDENT_AMBULATORY_CARE_PROVIDER_SITE_OTHER): Payer: BC Managed Care – PPO

## 2020-11-16 DIAGNOSIS — M5030 Other cervical disc degeneration, unspecified cervical region: Secondary | ICD-10-CM

## 2020-11-16 DIAGNOSIS — M542 Cervicalgia: Secondary | ICD-10-CM

## 2020-11-16 MED ORDER — HYDROCODONE-ACETAMINOPHEN 5-325 MG PO TABS: 1.0000 | ORAL_TABLET | Freq: Three times a day (TID) | ORAL | 0 refills | Status: DC | PRN

## 2020-11-16 MED ORDER — PREDNISONE 10 MG (48) PO TBPK: ORAL_TABLET | Freq: Every day | ORAL | 0 refills | Status: DC

## 2020-11-16 MED ORDER — KETOROLAC TROMETHAMINE 60 MG/2ML IM SOLN
30.0000 mg | Freq: Once | INTRAMUSCULAR | Status: AC
  Administered 2020-11-16: 30 mg via INTRAMUSCULAR

## 2020-11-16 NOTE — Progress Notes (Signed)
    Procedures performed today:    None.  Independent interpretation of notes and tests performed by another provider:   None.  Brief History, Exam, Impression, and Recommendations:    DDD (degenerative disc disease), cervical This is a pleasant 50 year old female, known cervical DDD, last MRI several years ago with predominantly C5-C6 spinal stenosis. More recently she has had increasing severe pain in the neck with radiation down the left arm to the fourth and fifth fingers. She is progressively having weakness, suppressed reflexes on the left, for this reason we will proceed with x-rays today, cervical spine MRI for epidural planning, likely left C7-T1. 30 of Toradol intramuscular today, prednisone taper as she just was on a burst for an upper respiratory infection, low-dose hydrocodone, return to see me 4 weeks after the epidural.  This will likely be 5 weeks from now.    ___________________________________________ Gwen Her. Dianah Field, M.D., ABFM., CAQSM. Primary Care and [place] Instructor of [place] [facility]

## 2020-11-16 NOTE — Addendum Note (Signed)
Addended by: Gust Brooms on: 11/16/2020 02:30 PM   Modules accepted: Orders

## 2020-11-16 NOTE — Assessment & Plan Note (Signed)
This is a pleasant 50 year old female, known cervical DDD, last MRI several years ago with predominantly C5-C6 spinal stenosis. More recently she has had increasing severe pain in the neck with radiation down the left arm to the fourth and fifth fingers. She is progressively having weakness, suppressed reflexes on the left, for this reason we will proceed with x-rays today, cervical spine MRI for epidural planning, likely left C7-T1. 30 of Toradol intramuscular today, prednisone taper as she just was on a burst for an upper respiratory infection, low-dose hydrocodone, return to see me 4 weeks after the epidural.  This will likely be 5 weeks from now.

## 2020-11-19 ENCOUNTER — Ambulatory Visit (INDEPENDENT_AMBULATORY_CARE_PROVIDER_SITE_OTHER): Payer: BC Managed Care – PPO

## 2020-11-19 ENCOUNTER — Other Ambulatory Visit: Payer: Self-pay

## 2020-11-19 DIAGNOSIS — M4802 Spinal stenosis, cervical region: Secondary | ICD-10-CM | POA: Diagnosis not present

## 2020-11-19 DIAGNOSIS — M4319 Spondylolisthesis, multiple sites in spine: Secondary | ICD-10-CM | POA: Diagnosis not present

## 2020-11-19 DIAGNOSIS — M5412 Radiculopathy, cervical region: Secondary | ICD-10-CM | POA: Diagnosis not present

## 2020-11-19 DIAGNOSIS — R531 Weakness: Secondary | ICD-10-CM

## 2020-11-19 DIAGNOSIS — M5030 Other cervical disc degeneration, unspecified cervical region: Secondary | ICD-10-CM

## 2020-11-19 DIAGNOSIS — M47813 Spondylosis without myelopathy or radiculopathy, cervicothoracic region: Secondary | ICD-10-CM | POA: Diagnosis not present

## 2020-11-19 DIAGNOSIS — S22009A Unspecified fracture of unspecified thoracic vertebra, initial encounter for closed fracture: Secondary | ICD-10-CM | POA: Diagnosis not present

## 2020-11-20 DIAGNOSIS — M5030 Other cervical disc degeneration, unspecified cervical region: Secondary | ICD-10-CM

## 2020-12-07 ENCOUNTER — Ambulatory Visit
Admission: RE | Admit: 2020-12-07 | Discharge: 2020-12-07 | Disposition: A | Discharge status: Home / Self Care | Payer: BC Managed Care – PPO | Source: Ambulatory Visit | Attending: Sports Medicine | Admitting: Sports Medicine

## 2020-12-07 DIAGNOSIS — M47812 Spondylosis without myelopathy or radiculopathy, cervical region: Secondary | ICD-10-CM | POA: Diagnosis not present

## 2020-12-07 DIAGNOSIS — M5030 Other cervical disc degeneration, unspecified cervical region: Secondary | ICD-10-CM

## 2020-12-07 MED ORDER — IOPAMIDOL (ISOVUE-M 300) INJECTION 61%
1.0000 mL | Freq: Once | INTRAMUSCULAR | Status: AC
  Administered 2020-12-07: 1 mL via EPIDURAL

## 2020-12-07 MED ORDER — TRIAMCINOLONE ACETONIDE 40 MG/ML IJ SUSP (RADIOLOGY)
60.0000 mg | Freq: Once | INTRAMUSCULAR | Status: AC
  Administered 2020-12-07: 60 mg via EPIDURAL

## 2020-12-07 NOTE — Discharge Instructions (Signed)

## 2020-12-21 ENCOUNTER — Ambulatory Visit: Payer: BC Managed Care – PPO | Admitting: Sports Medicine

## 2020-12-22 ENCOUNTER — Other Ambulatory Visit: Payer: Self-pay | Admitting: Family Medicine

## 2020-12-22 DIAGNOSIS — I10 Essential (primary) hypertension: Secondary | ICD-10-CM

## 2021-01-01 ENCOUNTER — Other Ambulatory Visit: Payer: Self-pay | Admitting: Family Medicine

## 2021-01-01 DIAGNOSIS — I10 Essential (primary) hypertension: Secondary | ICD-10-CM

## 2021-01-10 ENCOUNTER — Other Ambulatory Visit: Payer: Self-pay | Admitting: Family Medicine

## 2021-01-23 ENCOUNTER — Other Ambulatory Visit: Payer: Self-pay | Admitting: Family Medicine

## 2021-01-23 DIAGNOSIS — G4700 Insomnia, unspecified: Secondary | ICD-10-CM

## 2021-01-27 ENCOUNTER — Encounter: Payer: Self-pay | Admitting: Sports Medicine

## 2021-01-27 DIAGNOSIS — M47816 Spondylosis without myelopathy or radiculopathy, lumbar region: Secondary | ICD-10-CM

## 2021-02-02 ENCOUNTER — Other Ambulatory Visit: Payer: Self-pay

## 2021-02-02 ENCOUNTER — Ambulatory Visit
Admission: RE | Admit: 2021-02-02 | Discharge: 2021-02-02 | Disposition: A | Discharge status: Home / Self Care | Payer: BC Managed Care – PPO | Source: Ambulatory Visit | Attending: Sports Medicine | Admitting: Sports Medicine

## 2021-02-02 DIAGNOSIS — M47816 Spondylosis without myelopathy or radiculopathy, lumbar region: Secondary | ICD-10-CM

## 2021-02-02 DIAGNOSIS — M4727 Other spondylosis with radiculopathy, lumbosacral region: Secondary | ICD-10-CM | POA: Diagnosis not present

## 2021-02-02 MED ORDER — METHYLPREDNISOLONE ACETATE 40 MG/ML INJ SUSP (RADIOLOG
80.0000 mg | Freq: Once | INTRAMUSCULAR | Status: AC
  Administered 2021-02-02: 80 mg via EPIDURAL

## 2021-02-02 MED ORDER — IOPAMIDOL (ISOVUE-M 200) INJECTION 41%
1.0000 mL | Freq: Once | INTRAMUSCULAR | Status: AC
  Administered 2021-02-02: 1 mL via EPIDURAL

## 2021-02-02 NOTE — Discharge Instructions (Signed)

## 2021-02-14 ENCOUNTER — Encounter: Payer: Self-pay | Admitting: Family Medicine

## 2021-02-14 ENCOUNTER — Other Ambulatory Visit: Payer: Self-pay

## 2021-02-14 ENCOUNTER — Ambulatory Visit (INDEPENDENT_AMBULATORY_CARE_PROVIDER_SITE_OTHER): Payer: BC Managed Care – PPO | Admitting: Family Medicine

## 2021-02-14 VITALS — BP 119/80 | HR 78 | Resp 16 | Ht 64.0 in | Wt 138.0 lb

## 2021-02-14 DIAGNOSIS — R790 Abnormal level of blood mineral: Secondary | ICD-10-CM | POA: Diagnosis not present

## 2021-02-14 DIAGNOSIS — I10 Essential (primary) hypertension: Secondary | ICD-10-CM | POA: Diagnosis not present

## 2021-02-14 DIAGNOSIS — G4700 Insomnia, unspecified: Secondary | ICD-10-CM

## 2021-02-14 DIAGNOSIS — E282 Polycystic ovarian syndrome: Secondary | ICD-10-CM | POA: Diagnosis not present

## 2021-02-14 DIAGNOSIS — F4329 Adjustment disorder with other symptoms: Secondary | ICD-10-CM

## 2021-02-14 DIAGNOSIS — E039 Hypothyroidism, unspecified: Secondary | ICD-10-CM

## 2021-02-14 LAB — BASIC METABOLIC PANEL WITH GFR
BUN/Creatinine Ratio: 12 (calc) (ref 6–22)
BUN: 13 mg/dL (ref 7–25)
CO2: 33 mmol/L — ABNORMAL HIGH (ref 20–32)
Calcium: 9.6 mg/dL (ref 8.6–10.4)
Chloride: 102 mmol/L (ref 98–110)
Creat: 1.12 mg/dL — ABNORMAL HIGH (ref 0.50–1.03)
Glucose, Bld: 79 mg/dL (ref 65–99)
Potassium: 4.1 mmol/L (ref 3.5–5.3)
Sodium: 139 mmol/L (ref 135–146)
eGFR: 60 mL/min/{1.73_m2} (ref >=60)

## 2021-02-14 LAB — TSH: TSH: 0.87 mIU/L

## 2021-02-14 LAB — FERRITIN: Ferritin: 16 ng/mL (ref 16–232)

## 2021-02-14 MED ORDER — ZOLPIDEM TARTRATE 10 MG PO TABS: 10.0000 mg | ORAL_TABLET | Freq: Every day | ORAL | 0 refills | Status: DC

## 2021-02-14 NOTE — Assessment & Plan Note (Signed)
Well controlled. Continue current regimen. Follow up in  6 mo  

## 2021-02-14 NOTE — Assessment & Plan Note (Signed)
I really encouraged her to consider therapy/counseling I think it could be really helpful she is not interested in medication at this point.  She says she is not completely opposed she has used it a couple of times in her life when her dad passed away and when she went through divorce but would like to try to get through what she is going through right now without that.  She would prefer to have an in network referral now that she will be employed with [employer].  So we will place a referral there.  She is really has a lot on her shoulders.  I really think she did a great job in making a decision to step down from her current position to really reduce her stress levels and allow herself to really do some more self-care which is fantastic.  And it was really difficult.  I do think her stress is directly impacting her sleep quality.

## 2021-02-14 NOTE — Progress Notes (Signed)
Established Patient Office Visit  Subjective:  Patient ID: Carly [name], female    DOB: 11-Oct-1970  Age: 51 y.o. MRN: [id number]  CC:  Chief Complaint  Patient presents with   Hypertension    Follow up    Insomnia    Patient states she is taking Ambien every night. Patient states sometimes she is only able to get a few hours of sleep.    Anxiety    Follow up. Panic attacks. Patient is not taking Klonopin but would like to discuss other options     HPI Carly [name] presents for   Hypertension- Pt denies chest pain, SOB, dizziness, or heart palpitations.  Taking meds as directed w/o problems.  Denies medication side effects.    F/U insomnia -   She is really struggling with her sleep she has been taking her Ambien every night and she is currently on 10 mg.  But she is only getting a few hours of sleep and then feeling exhausted.  She actually had 1 day where she drove to work and does any remember getting there and her boss ended up sending her back home.  She also wanted to discuss anxiety.  Ever since her husband ended up hospitalized and very ill she just has felt more anxious and more stressed she is even had a few panic attacks here and there which is just not like her.  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  Her mom who is 25 actually fell recently and broke her shoulder and her knee and is not doing well.  She finds her self easily tearful which again she feels is not like her.  And she just found out that her sister was diagnosed with cancer.  In fact she just stepped down from her current job and is basically taken step back and just becoming a floor Education officer, museum over at [employer].  It will be a big pickup but she just felt like it was the best thing to do for her mental health right now.  Past Medical  History:  Diagnosis Date   Arthritis    Hypertension    PCOS (polycystic ovarian syndrome)    Thyroid disease    TIA (transient ischemic attack)     Past Surgical History:  Procedure Laterality Date   2 ectopic pregnancies  [id number]   lymph nodes     melanoma removal     RT SIDE   removal of fallopian tube  1999    Family History  Problem Relation Age of Onset   Melanoma Mother    Depression Mother    Hyperlipidemia Mother    Colon cancer Maternal Grandmother    Colon cancer Father 76       deceased.     Social History   Socioeconomic History   Marital status: Married    Spouse name: Herbie [place]   Number of children: 3   Years of education: Not on file   Highest education level: Not on file  Occupational History   Occupation: [employer]    Employer: [employer]: [employer].   Tobacco Use   Smoking status: Never   Smokeless tobacco: Never  Vaping Use   Vaping Use: Never used  Substance and Sexual Activity  Alcohol use: No   Drug use: No   Sexual activity: Yes    Partners: Male    Comment: adopted  Other Topics Concern   Not on file  Social History Narrative   Caffeine daily. No regular exercise.  She was adopted.    Social Determinants of Health   Financial Resource Strain: Not on file  Food Insecurity: Not on file  Transportation Needs: Not on file  Physical Activity: Not on file  Stress: Not on file  Social Connections: Not on file  Intimate Partner Violence: Not on file    Outpatient Medications Prior to Visit  Medication Sig Dispense Refill   cyanocobalamin (,VITAMIN B-12,) 1000 MCG/ML injection INJECT 1ML INTO THE MUSCLE EVERY 21 DAYS 2 mL 0   fluticasone (FLONASE) 50 MCG/ACT nasal spray Place 2 sprays into the nose daily as needed. For seasonal allergy nasal congestion 16 g 1   gabapentin (NEURONTIN) 800 MG tablet Take 1 tablet (800 mg total) by mouth 3 (three) times daily. 270 tablet 3   hydrochlorothiazide  (HYDRODIURIL) 12.5 MG tablet Take 1/2 (one-half) tablet by mouth once daily 45 tablet 0   levonorgestrel (MIRENA) 20 MCG/24HR IUD 1 Intra Uterine Device (1 each total) by Intrauterine route once. Inserted 10/19/14 1 each 0   losartan (COZAAR) 25 MG tablet Take 1 tablet by mouth once daily 90 tablet 0   metFORMIN (GLUCOPHAGE) 1000 MG tablet TAKE 1 TABLET BY MOUTH TWICE DAILY WITH MEALS 180 tablet 0   omeprazole (PRILOSEC) 40 MG capsule Take 1 capsule by mouth twice daily (Patient taking differently: daily.) 90 capsule 3   spironolactone (ALDACTONE) 25 MG tablet Take 1 tablet by mouth once daily 90 tablet 0   SYNTHROID 88 MCG tablet Take 1 tablet by mouth once daily 90 tablet 0   SYRINGE-NEEDLE, DISP, 3 ML 23G X 1" 3 ML MISC by Does not apply route.     zolpidem (AMBIEN) 10 MG tablet TAKE 1 TABLET BY MOUTH AT BEDTIME 30 tablet 0   clonazePAM (KLONOPIN) 0.5 MG tablet Take 0.5-1 tablets (0.25-0.5 mg total) by mouth daily as needed for anxiety. (Patient not taking: Reported on 02/14/2021) 15 tablet 0   HYDROcodone-acetaminophen (NORCO/VICODIN) 5-325 MG tablet Take 1 tablet by mouth every 8 (eight) hours as needed for moderate pain. 15 tablet 0   predniSONE (STERAPRED UNI-PAK 48 TAB) 10 MG (48) TBPK tablet Take by mouth daily. 12-day taper pack, use as directed for taper 1 tablet 0   No facility-administered medications prior to visit.    Allergies  Allergen Reactions   Cymbalta [Duloxetine Hcl] Other (See Comments)    Jolting/jerking sensations.    Sulfonamide Derivatives Hives   Tape Rash    Use paper tape    ROS Review of Systems    Objective:    Physical Exam Constitutional:      Appearance: Normal appearance. She is well-developed.  HENT:     Head: Normocephalic and atraumatic.  Cardiovascular:     Rate and Rhythm: Normal rate and regular rhythm.     Heart sounds: Normal heart sounds.  Pulmonary:     Effort: Pulmonary effort is normal.     Breath sounds: Normal breath sounds.   Skin:    General: Skin is warm and dry.  Neurological:     Mental Status: She is alert and oriented to person, place, and time.  Psychiatric:        Behavior: Behavior normal.    BP 119/80  Pulse 78    Resp 16    Ht 5' 4"  (1.626 m)    Wt 138 lb (62.6 kg)    SpO2 98%    BMI 23.69 kg/m  Wt Readings from Last 3 Encounters:  02/14/21 138 lb (62.6 kg)  11/02/20 140 lb (63.5 kg)  08/11/20 134 lb (60.8 kg)     Health Maintenance Due  Topic Date Due   HIV Screening  Never done   Hepatitis C Screening  Never done   Zoster Vaccines- Shingrix (1 of 2) Never done   MAMMOGRAM  02/22/2017   COVID-19 Vaccine (3 - Moderna risk series) 03/18/2019    There are no preventive care reminders to display for this patient.  Lab Results  Component Value Date   TSH 1.65 08/19/2020   Lab Results  Component Value Date   WBC 7.3 08/19/2020   HGB 12.6 08/19/2020   HCT 37.9 08/19/2020   MCV 89.8 08/19/2020   PLT 372 08/19/2020   Lab Results  Component Value Date   NA 138 08/19/2020   K 3.7 08/19/2020   CO2 29 08/19/2020   GLUCOSE 72 08/19/2020   BUN 15 08/19/2020   CREATININE 1.15 (H) 08/19/2020   BILITOT 0.3 08/19/2020   ALKPHOS 36 09/11/2016   AST 12 08/19/2020   ALT 11 08/19/2020   PROT 6.6 08/19/2020   ALBUMIN 4.2 09/11/2016   CALCIUM 9.2 08/19/2020   EGFR 58 (L) 08/19/2020   Lab Results  Component Value Date   CHOL 179 08/19/2020   Lab Results  Component Value Date   HDL 48 (L) 08/19/2020   Lab Results  Component Value Date   LDLCALC 116 (H) 08/19/2020   Lab Results  Component Value Date   TRIG 56 08/19/2020   Lab Results  Component Value Date   CHOLHDL 3.7 08/19/2020   Lab Results  Component Value Date   HGBA1C 5.1 04/17/2019      Assessment & Plan:   Problem List Items Addressed This Visit       Cardiovascular and Mediastinum   Essential hypertension, benign    Well controlled. Continue current regimen. Follow up in  6 mo       Relevant Orders    BASIC METABOLIC PANEL WITH GFR     Endocrine   PCOS (polycystic ovarian syndrome)    Continue metformin       Hypothyroid    Due to recheck TSH.       Relevant Orders   TSH     Other   Stress and adjustment reaction    I really encouraged her to consider therapy/counseling I think it could be really helpful she is not interested in medication at this point.  She says she is not completely opposed she has used it a couple of times in her life when her dad passed away and when she went through divorce but would like to try to get through what she is going through right now without that.  She would prefer to have an in network referral now that she will be employed with [employer].  So we will place a referral there.  She is really has a lot on her shoulders.  I really think she did a great job in making a decision to step down from her current position to really reduce her stress levels and allow herself to really do some more self-care which is fantastic.  And it was really difficult.  I do think her stress  is directly impacting her sleep quality.      Relevant Orders   Ambulatory referral to [facility]    Sleep is not in a great place right now.  We did discuss options including may be switching to a different medication but I really feel like a lot of this is being driven by her recent shift in mood.  We will get a watch that over the next few months and see if things seem to improve on their own but if not she will let us know.      Relevant Medications   zolpidem (AMBIEN) 10 MG tablet   Other Visit Diagnoses     Low ferritin    -  Primary   Relevant Orders   Ferritin       Meds ordered this encounter  Medications   zolpidem (AMBIEN) 10 MG tablet    Sig: Take 1 tablet (10 mg total) by mouth at bedtime.    Dispense:  90 tablet    Refill:  0    Follow-up: Return in about 6 months (around 08/14/2021) for Hypertension and thyroid.    Beatrice Lecher, MD

## 2021-02-14 NOTE — Assessment & Plan Note (Signed)
Sleep is not in a great place right now.  We did discuss options including may be switching to a different medication but I really feel like a lot of this is being driven by her recent shift in mood.  We will get a watch that over the next few months and see if things seem to improve on their own but if not she will let us know.

## 2021-02-14 NOTE — Assessment & Plan Note (Signed)
Continue metformin.

## 2021-02-14 NOTE — Assessment & Plan Note (Signed)
Due to recheck TSH. 

## 2021-02-15 ENCOUNTER — Encounter: Payer: Self-pay | Admitting: Family Medicine

## 2021-02-15 ENCOUNTER — Other Ambulatory Visit: Payer: Self-pay

## 2021-02-15 DIAGNOSIS — R790 Abnormal level of blood mineral: Secondary | ICD-10-CM

## 2021-02-15 DIAGNOSIS — D509 Iron deficiency anemia, unspecified: Secondary | ICD-10-CM

## 2021-02-15 NOTE — Progress Notes (Signed)
Hi Cyndi, Kidney function is stable from the last 6 months. Iron is still low.  I do think that this could be contributing to some of the fatigue so I really encourage you to start taking iron regularly if you are not already and really increase iron rich foods in your diet this helps as well.  If you do not tolerate regular iron very well then I would recommend a vegetarian or vegan iron which is made from plants instead of meat.  Some people tend to tolerate that much better.  Thyroid level looks good.

## 2021-02-16 NOTE — Telephone Encounter (Signed)
Referral sent 

## 2021-04-03 ENCOUNTER — Other Ambulatory Visit: Payer: Self-pay | Admitting: *Deleted

## 2021-04-03 DIAGNOSIS — D510 Vitamin B12 deficiency anemia due to intrinsic factor deficiency: Secondary | ICD-10-CM

## 2021-04-03 DIAGNOSIS — I10 Essential (primary) hypertension: Secondary | ICD-10-CM

## 2021-04-03 MED ORDER — HYDROCHLOROTHIAZIDE 12.5 MG PO TABS: ORAL_TABLET | ORAL | 0 refills | Status: DC

## 2021-04-03 MED ORDER — LOSARTAN POTASSIUM 25 MG PO TABS: 25.0000 mg | ORAL_TABLET | Freq: Every day | ORAL | 0 refills | Status: DC

## 2021-04-03 MED ORDER — CYANOCOBALAMIN 1000 MCG/ML IJ SOLN: INTRAMUSCULAR | 0 refills | Status: DC

## 2021-04-04 ENCOUNTER — Encounter: Payer: BC Managed Care – PPO | Admitting: Family Medicine

## 2021-04-13 ENCOUNTER — Telehealth: Payer: Self-pay

## 2021-04-13 ENCOUNTER — Other Ambulatory Visit: Payer: Self-pay

## 2021-04-13 ENCOUNTER — Encounter: Payer: Self-pay | Admitting: Family Medicine

## 2021-04-13 DIAGNOSIS — E039 Hypothyroidism, unspecified: Secondary | ICD-10-CM

## 2021-04-13 MED ORDER — SYNTHROID 88 MCG PO TABS: 88.0000 ug | ORAL_TABLET | Freq: Every day | ORAL | 0 refills | Status: DC

## 2021-04-13 NOTE — Telephone Encounter (Addendum)
Initiated Prior authorization MBT:[id number] 88MCG tablets ?Via: Covermymeds ?Case/Key:[id number] ?Status: approved as of 04/13/21 ?Reason:approved through 04/14/2022 ?Notified Pt via: Mychart ?

## 2021-04-14 MED ORDER — SYNTHROID 88 MCG PO TABS: 88.0000 ug | ORAL_TABLET | Freq: Every day | ORAL | 3 refills | Status: DC

## 2021-04-14 MED ORDER — SYNTHROID 88 MCG PO TABS: 88.0000 ug | ORAL_TABLET | Freq: Every day | ORAL | 0 refills | Status: DC

## 2021-04-14 NOTE — Addendum Note (Signed)
Addended by: Narda Rutherford on: 04/14/2021 10:02 AM ? ? Modules accepted: Orders ? ?

## 2021-04-17 ENCOUNTER — Other Ambulatory Visit: Payer: Self-pay | Admitting: Family Medicine

## 2021-04-19 MED ORDER — SPIRONOLACTONE 25 MG PO TABS: 25.0000 mg | ORAL_TABLET | Freq: Every day | ORAL | 0 refills | Status: DC

## 2021-05-01 ENCOUNTER — Other Ambulatory Visit: Payer: Self-pay | Admitting: Family Medicine

## 2021-05-01 MED ORDER — METFORMIN HCL 1000 MG PO TABS: 1000.0000 mg | ORAL_TABLET | Freq: Two times a day (BID) | ORAL | 1 refills | Status: DC

## 2021-05-11 ENCOUNTER — Encounter: Payer: Self-pay | Admitting: Family Medicine

## 2021-05-11 ENCOUNTER — Ambulatory Visit (INDEPENDENT_AMBULATORY_CARE_PROVIDER_SITE_OTHER): Payer: PRIVATE HEALTH INSURANCE | Admitting: Family Medicine

## 2021-05-11 VITALS — BP 126/83 | HR 77 | Resp 16 | Ht 64.0 in | Wt 140.0 lb

## 2021-05-11 DIAGNOSIS — D510 Vitamin B12 deficiency anemia due to intrinsic factor deficiency: Secondary | ICD-10-CM | POA: Diagnosis not present

## 2021-05-11 DIAGNOSIS — D509 Iron deficiency anemia, unspecified: Secondary | ICD-10-CM | POA: Diagnosis not present

## 2021-05-11 DIAGNOSIS — Z0000 Encounter for general adult medical examination without abnormal findings: Secondary | ICD-10-CM | POA: Diagnosis not present

## 2021-05-11 DIAGNOSIS — I10 Essential (primary) hypertension: Secondary | ICD-10-CM | POA: Diagnosis not present

## 2021-05-11 DIAGNOSIS — R232 Flushing: Secondary | ICD-10-CM

## 2021-05-11 DIAGNOSIS — Z85820 Personal history of malignant melanoma of skin: Secondary | ICD-10-CM

## 2021-05-11 MED ORDER — LOSARTAN POTASSIUM 25 MG PO TABS: 25.0000 mg | ORAL_TABLET | Freq: Every day | ORAL | 3 refills | Status: DC

## 2021-05-11 MED ORDER — HYDROCHLOROTHIAZIDE 12.5 MG PO TABS: ORAL_TABLET | ORAL | 3 refills | Status: DC

## 2021-05-11 MED ORDER — SPIRONOLACTONE 25 MG PO TABS: 25.0000 mg | ORAL_TABLET | Freq: Every day | ORAL | 3 refills | Status: DC

## 2021-05-11 MED ORDER — CYANOCOBALAMIN 1000 MCG/ML IJ SOLN: INTRAMUSCULAR | 0 refills | Status: DC

## 2021-05-11 NOTE — Progress Notes (Addendum)
? ?Complete physical exam ? ?Patient: Carly [name]   DOB: 23-Nov-1970   51 y.o. Female  MRN: [id number] ? ?Subjective:  ?  ?Chief Complaint  ?Patient presents with  ? Annual Exam  ?  Not fasting   ? Hot Flashes  ?  Night Sweats. Patient would like to discuss options  ? ? ?Omeka Holben is a 51 y.o. female who presents today for a complete physical exam. She reports consuming a general diet.  She generally feels well. She reports sleeping fairly well. She does not have additional problems to discuss today.  She does have a Mirena and so does not get periods.  But more recently she has been experiencing night sweats and then heat surges during the day.  Her sleep overall is okay except for when she has night sweats and that part is disruptive.  She has noticed an increase in hair loss and so is not sure if that is just part of aging, menopause, or possibly her thyroid.  TSH was 0.8 back in January.  She said she has had vaginal dryness for years.  She is interested in taking a look at her hormone levels.  She really struggles with fatigue.  She has a history of iron deficiency anemia and has not heard back on her referral to hematology.  She also does her B12 injections every 3 weeks and does need a refill on the B12.  Is also been having her seasonal allergies and has been using her Flonase intermittently. ? ? ?Most recent fall risk assessment: ? ?  05/11/2021  ?  3:57 PM  ?Fall Risk   ?Falls in the past year? 0  ?Number falls in past yr: 0  ?Injury with Fall? 0  ?Risk for fall due to : No Fall Risks  ?Follow up Falls prevention discussed;Falls evaluation completed  ? ?  ?Most recent depression screenings: ? ?  05/11/2021  ?  4:36 PM 02/14/2021  ?  8:42 AM  ?PHQ 2/9 Scores  ?PHQ - 2 Score 1 4  ?PHQ- 9 Score 7 15  ? ? ? ?See chart for up-to-date family history social history. ? ? ?Patient Care Team: ?Hali Marry, MD as PCP - General (Family Medicine) ?Eusebio Friendly, MD as Referring Physician (Internal  Medicine) ?Aleene Davidson, MD as Referring Physician (Obstetrics and Gynecology)  ? ?Outpatient Medications Prior to Visit  ?Medication Sig Note  ? fluticasone (FLONASE) 50 MCG/ACT nasal spray Place 2 sprays into the nose daily as needed. For seasonal allergy nasal congestion   ? gabapentin (NEURONTIN) 800 MG tablet Take 1 tablet (800 mg total) by mouth 3 (three) times daily. 05/11/2021: Patient takes 1 tablet in the morning and 1 tablet at nigh every other night   ? levonorgestrel (MIRENA) 20 MCG/24HR IUD 1 Intra Uterine Device (1 each total) by Intrauterine route once. Inserted 10/19/14   ? metFORMIN (GLUCOPHAGE) 1000 MG tablet Take 1 tablet (1,000 mg total) by mouth 2 (two) times daily with a meal.   ? omeprazole (PRILOSEC) 40 MG capsule Take 1 capsule by mouth twice daily (Patient taking differently: daily.)   ? SYNTHROID 88 MCG tablet Take 1 tablet (88 mcg total) by mouth daily.   ? SYRINGE-NEEDLE, DISP, 3 ML 23G X 1" 3 ML MISC by Does not apply route.   ? zolpidem (AMBIEN) 10 MG tablet Take 1 tablet (10 mg total) by mouth at bedtime.   ? [DISCONTINUED] cyanocobalamin (,VITAMIN B-12,) 1000 MCG/ML injection INJECT 1ML INTO THE  MUSCLE EVERY 21 DAYS   ? [DISCONTINUED] hydrochlorothiazide (HYDRODIURIL) 12.5 MG tablet Take 1/2 (one-half) tablet by mouth once daily   ? [DISCONTINUED] losartan (COZAAR) 25 MG tablet Take 1 tablet (25 mg total) by mouth daily.   ? [DISCONTINUED] spironolactone (ALDACTONE) 25 MG tablet Take 1 tablet (25 mg total) by mouth daily.   ? [DISCONTINUED] clonazePAM (KLONOPIN) 0.5 MG tablet Take 0.5-1 tablets (0.25-0.5 mg total) by mouth daily as needed for anxiety. (Patient not taking: Reported on 02/14/2021)   ? ?No facility-administered medications prior to visit.  ? ? ?ROS ? ? ? ? ?   ?Objective:  ? ?  ?BP 126/83   Pulse 77   Resp 16   Ht '5\' 4"'$  (1.626 m)   Wt 140 lb (63.5 kg)   SpO2 98%   BMI 24.03 kg/m?  ? ? ?Physical Exam ?Vitals and nursing note reviewed.  ?Constitutional:   ?    Appearance: She is well-developed.  ?HENT:  ?   Head: Normocephalic and atraumatic.  ?   Right Ear: External ear normal.  ?   Left Ear: External ear normal.  ?   Nose: Nose normal.  ?   Mouth/Throat:  ?   Pharynx: Oropharynx is clear.  ?Eyes:  ?   Conjunctiva/sclera: Conjunctivae normal.  ?   Pupils: Pupils are equal, round, and reactive to light.  ?Neck:  ?   Thyroid: No thyromegaly.  ?Cardiovascular:  ?   Rate and Rhythm: Normal rate and regular rhythm.  ?   Heart sounds: Normal heart sounds.  ?Pulmonary:  ?   Effort: Pulmonary effort is normal.  ?   Breath sounds: Normal breath sounds. No wheezing.  ?Abdominal:  ?   General: Bowel sounds are normal.  ?   Palpations: Abdomen is soft.  ?Musculoskeletal:  ?   Cervical back: Neck supple.  ?Lymphadenopathy:  ?   Cervical: No cervical adenopathy.  ?Skin: ?   General: Skin is warm and dry.  ?   Coloration: Skin is not pale.  ?Neurological:  ?   Mental Status: She is alert and oriented to person, place, and time.  ?Psychiatric:     ?   Behavior: Behavior normal.  ?  ? ?No results found for any visits on 05/11/21. ? ?   ?Assessment & Plan:  ?  ?Routine Health Maintenance and Physical Exam ? ?Immunization History  ?Administered Date(s) Administered  ? Influenza Inj Mdck Quad Pf 01/22/2009, 11/20/2012, 11/19/2013, 11/14/2014, 11/23/2015  ? Influenza,inj,Quad PF,6+ Mos 10/23/2019  ? Influenza-Unspecified 11/06/2017, 11/19/2018, 10/24/2020  ? Moderna Sars-Covid-2 Vaccination 01/19/2019, 02/18/2019  ? Tdap 01/22/2009, 08/11/2020  ? ? ?Health Maintenance  ?Topic Date Due  ? HIV Screening  Never done  ? Hepatitis C Screening  Never done  ? MAMMOGRAM  02/22/2017  ? COVID-19 Vaccine (3 - Moderna risk series) 05/27/2021 (Originally 03/18/2019)  ? Zoster Vaccines- Shingrix (1 of 2) 08/10/2021 (Originally 05/05/1988)  ? INFLUENZA VACCINE  08/22/2021  ? COLONOSCOPY (Pts 45-67yr Insurance coverage will need to be confirmed)  12/12/2021  ? PAP SMEAR-Modifier  07/28/2025  ? TETANUS/TDAP   08/12/2030  ? Pneumococcal Vaccine [id number] old  Aged Out  ? HPV VACCINES  Aged Out  ? ? ?Discussed health benefits of physical activity, and encouraged her to engage in regular exercise appropriate for her age and condition. ? ?Problem List Items Addressed This Visit   ? ?  ? Cardiovascular and Mediastinum  ? Essential hypertension, benign  ? Relevant Medications  ?  spironolactone (ALDACTONE) 25 MG tablet  ? losartan (COZAAR) 25 MG tablet  ? hydrochlorothiazide (HYDRODIURIL) 12.5 MG tablet  ? Other Relevant Orders  ? CBC  ? Fe+TIBC+Fer  ? TSH  ? Progesterone  ? Estradiol  ? Follicle stimulating hormone  ? Luteinizing hormone  ?  ? Other  ? ANEMIA, IRON DEFICIENCY  ? Relevant Medications  ? cyanocobalamin (,VITAMIN B-12,) 1000 MCG/ML injection  ? Other Relevant Orders  ? Ambulatory referral to Hematology / Oncology  ? CBC  ? Fe+TIBC+Fer  ? TSH  ? Progesterone  ? Estradiol  ? Follicle stimulating hormone  ? Luteinizing hormone  ? ?Other Visit Diagnoses   ? ? Wellness examination    -  Primary  ? Pernicious anemia      ? Relevant Medications  ? cyanocobalamin (,VITAMIN B-12,) 1000 MCG/ML injection  ? Other Relevant Orders  ? CBC  ? Fe+TIBC+Fer  ? TSH  ? Progesterone  ? Estradiol  ? Follicle stimulating hormone  ? Luteinizing hormone  ? B12  ? History of malignant melanoma of back      ? Relevant Orders  ? Ambulatory referral to Dermatology  ? Hot flashes      ? Relevant Medications  ? spironolactone (ALDACTONE) 25 MG tablet  ? losartan (COZAAR) 25 MG tablet  ? hydrochlorothiazide (HYDRODIURIL) 12.5 MG tablet  ? Other Relevant Orders  ? CBC  ? Fe+TIBC+Fer  ? TSH  ? Progesterone  ? Estradiol  ? Follicle stimulating hormone  ? Luteinizing hormone  ? ?  ? ?Return in about 6 months (around 11/10/2021) for bp. ? ?Keep up a regular exercise program and make sure you are eating a healthy diet ?Try to eat 4 servings of dairy a day, or if you are lactose intolerant take a calcium with vitamin D daily.  ?Your vaccines are up  to date.  ? ?She never heard back from her hematology referral  so we will place a new one today.  Also needs dermatology referral as she has a history of melanoma and its been a few years since she has been u

## 2021-05-12 ENCOUNTER — Encounter: Payer: Self-pay | Admitting: Family Medicine

## 2021-05-12 ENCOUNTER — Other Ambulatory Visit: Payer: Self-pay | Admitting: *Deleted

## 2021-05-12 DIAGNOSIS — D510 Vitamin B12 deficiency anemia due to intrinsic factor deficiency: Secondary | ICD-10-CM

## 2021-05-12 LAB — FOLLICLE STIMULATING HORMONE: FSH: 160.8 m[IU]/mL — ABNORMAL HIGH

## 2021-05-12 LAB — CBC
HCT: 40.2 % (ref 35.0–45.0)
Hemoglobin: 13.4 g/dL (ref 11.7–15.5)
MCH: 30.2 pg (ref 27.0–33.0)
MCHC: 33.3 g/dL (ref 32.0–36.0)
MCV: 90.7 fL (ref 80.0–100.0)
MPV: 10.3 fL (ref 7.5–12.5)
Platelets: 357 10*3/uL (ref 140–400)
RBC: 4.43 10*6/uL (ref 3.80–5.10)
RDW: 12.5 % (ref 11.0–15.0)
WBC: 6.5 10*3/uL (ref 3.8–10.8)

## 2021-05-12 LAB — TSH: TSH: 0.88 mIU/L

## 2021-05-12 LAB — PROGESTERONE: Progesterone: <0.5 ng/mL

## 2021-05-12 LAB — IRON,TIBC AND FERRITIN PANEL
%SAT: 30 % (calc) (ref 16–45)
Ferritin: 14 ng/mL — ABNORMAL LOW (ref 16–232)
Iron: 148 ug/dL (ref 45–160)
TIBC: 493 mcg/dL (calc) — ABNORMAL HIGH (ref 250–450)

## 2021-05-12 LAB — LUTEINIZING HORMONE: LH: 108.5 m[IU]/mL — ABNORMAL HIGH

## 2021-05-12 LAB — VITAMIN B12: Vitamin B-12: 274 pg/mL (ref 200–1100)

## 2021-05-12 LAB — ESTRADIOL: Estradiol: <15 pg/mL

## 2021-05-12 MED ORDER — CYANOCOBALAMIN 1000 MCG/ML IJ SOLN: INTRAMUSCULAR | 0 refills | Status: DC

## 2021-05-12 NOTE — Progress Notes (Signed)
Hi Cyndi, your blood count is normal as well as the thyroid looks good.  Based on your hormone labs, you are officially postmenopausal.  Your B12 is a little on the lower end I think you mentioned you are doing your injections every 3 weeks.  Go ahead and increase to every 2 weeks for the next 3 months and then we can recheck it to see if we can go back to every 3 weeks.  Iron panel still pending.

## 2021-05-12 NOTE — Progress Notes (Signed)
Iron is still on the low end total iron looks pretty good but iron stores are still low with a ferritin of 14.  I put in a new referral yesterday for hematology at [facility].  We will get a call this time.   but let me know if you have not heard from him in about 2 weeks

## 2021-05-15 ENCOUNTER — Encounter: Payer: Self-pay | Admitting: Family Medicine

## 2021-05-24 ENCOUNTER — Other Ambulatory Visit: Payer: Self-pay | Admitting: Family Medicine

## 2021-05-24 DIAGNOSIS — G4700 Insomnia, unspecified: Secondary | ICD-10-CM

## 2021-05-26 MED ORDER — ANGELIQ 0.5-1 MG PO TABS: 1.0000 | ORAL_TABLET | Freq: Every day | ORAL | 1 refills | Status: DC

## 2021-05-26 NOTE — Progress Notes (Signed)
Meds ordered this encounter  ?Medications  ? drospirenone-estradiol (ANGELIQ) 0.5-1 MG tablet  ?  Sig: Take 1 tablet by mouth daily.  ?  Dispense:  90 tablet  ?  Refill:  1  ? ? ?

## 2021-05-26 NOTE — Addendum Note (Signed)
Addended by: Beatrice Lecher D on: 05/26/2021 11:38 AM ? ? Modules accepted: Orders ? ?

## 2021-08-16 LAB — HM MAMMOGRAPHY

## 2021-08-21 ENCOUNTER — Telehealth: Payer: Self-pay | Admitting: Family Medicine

## 2021-08-21 ENCOUNTER — Ambulatory Visit: Payer: BC Managed Care – PPO | Admitting: Family Medicine

## 2021-08-21 NOTE — Telephone Encounter (Signed)
Refill for one year sent to pharmacy in March, she can contact pharmacy for refills. [address], [place], [place] [address]  Phone:  [phone]    Thanks!

## 2021-08-21 NOTE — Telephone Encounter (Signed)
Dr. Ilda [name] came in not realizing her appointment was rescheduled to October.  She asked while she was here if she could get a refill on her synthroid she stated she ran out of the medication yesterday.   Jenny Reichmann

## 2021-08-26 IMAGING — DX DG LUMBAR SPINE COMPLETE 4+V
5 series · 5 of 5 positions shown · non-contrast
Comparison: None.

CLINICAL DATA: Chronic low back pain, sciatica

EXAM:
LUMBAR SPINE - COMPLETE 4+ VIEW

[l-spine ap]
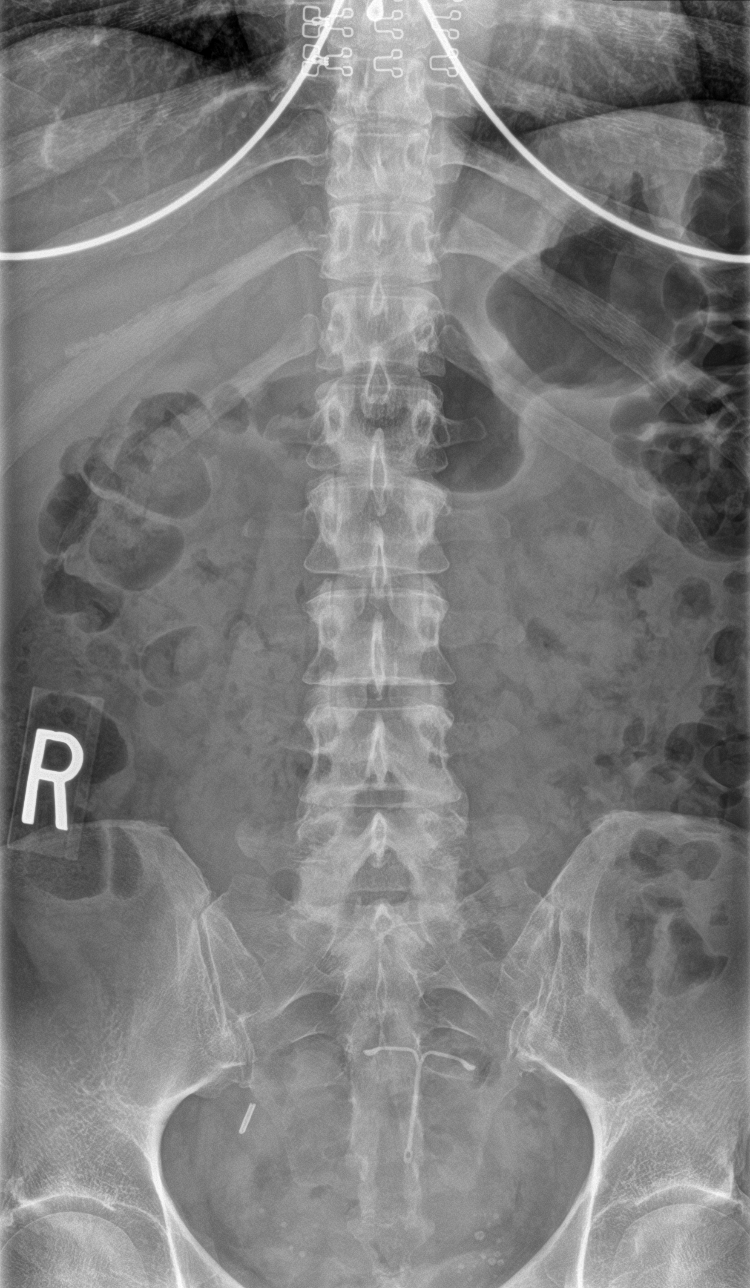

[l-spine obl (1 of 2)]
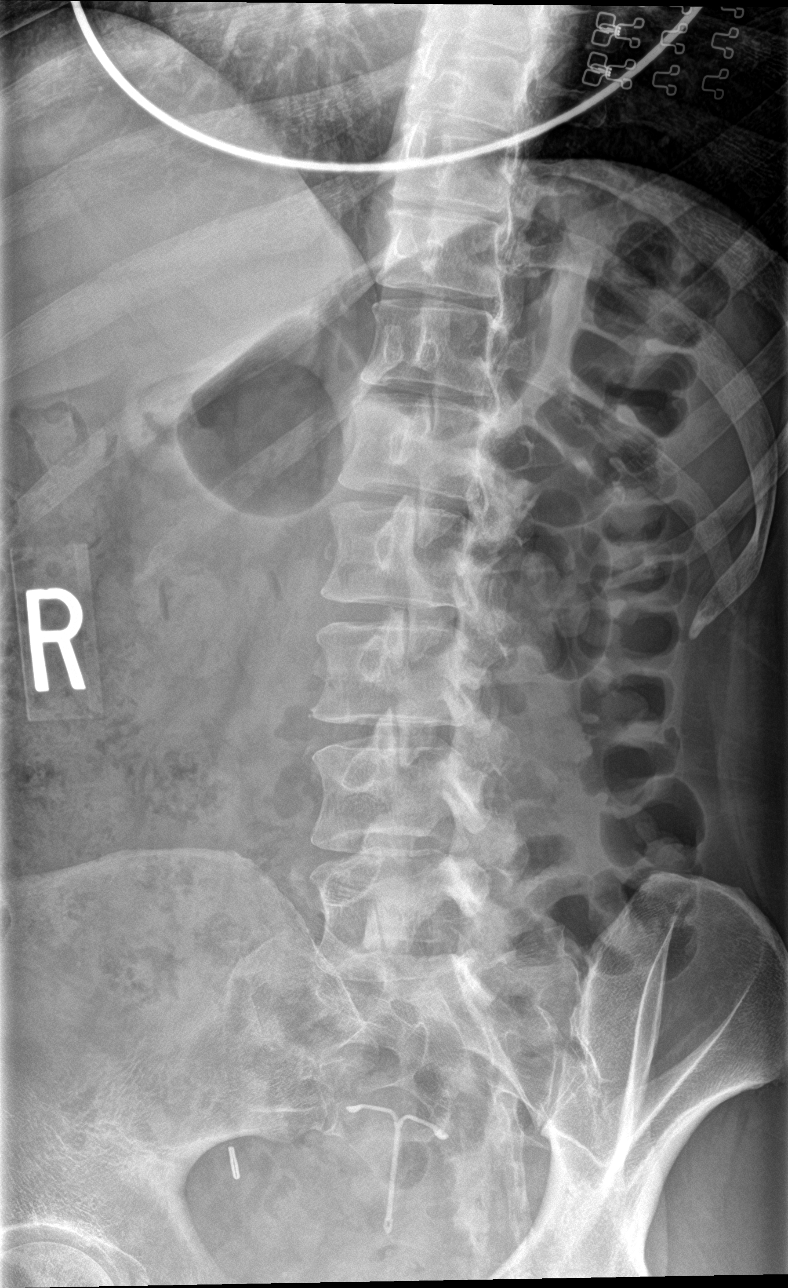

[l-spine obl (2 of 2)]
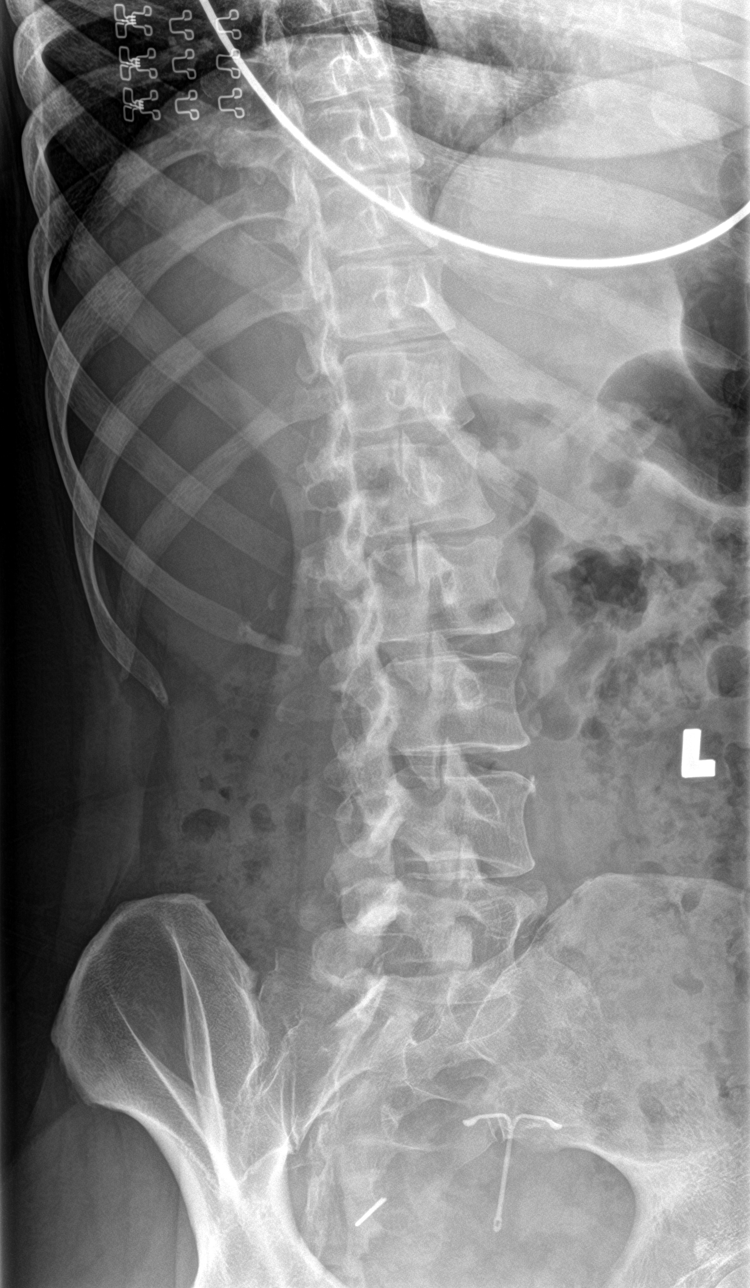

[l-spine lat]
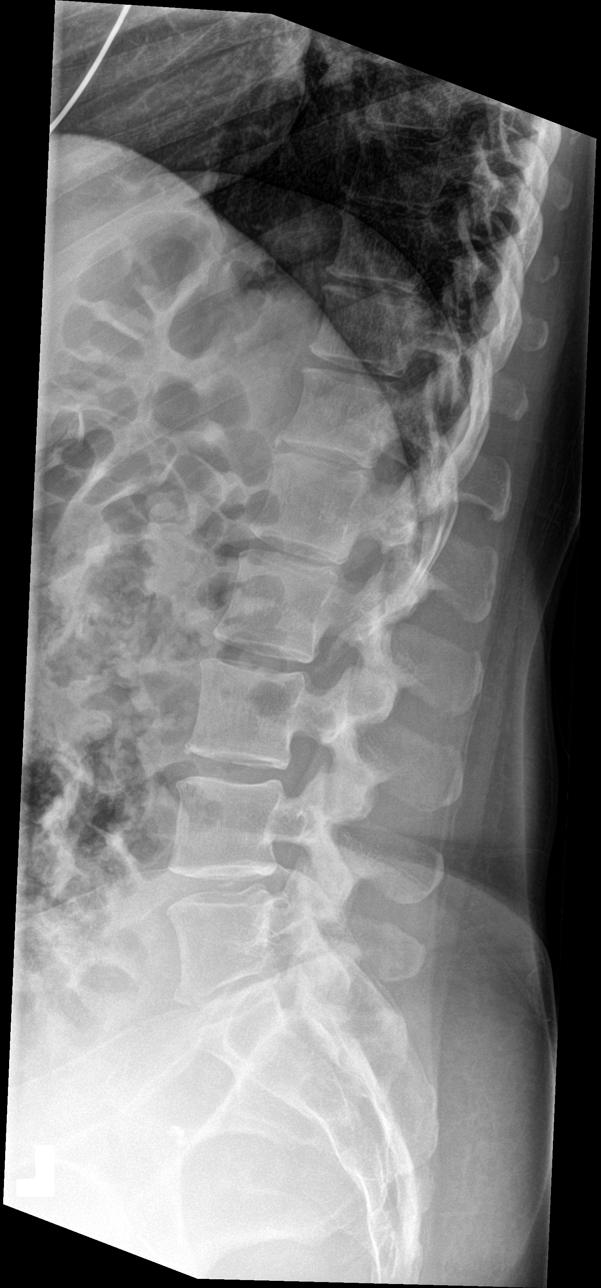

[l-spine spot]
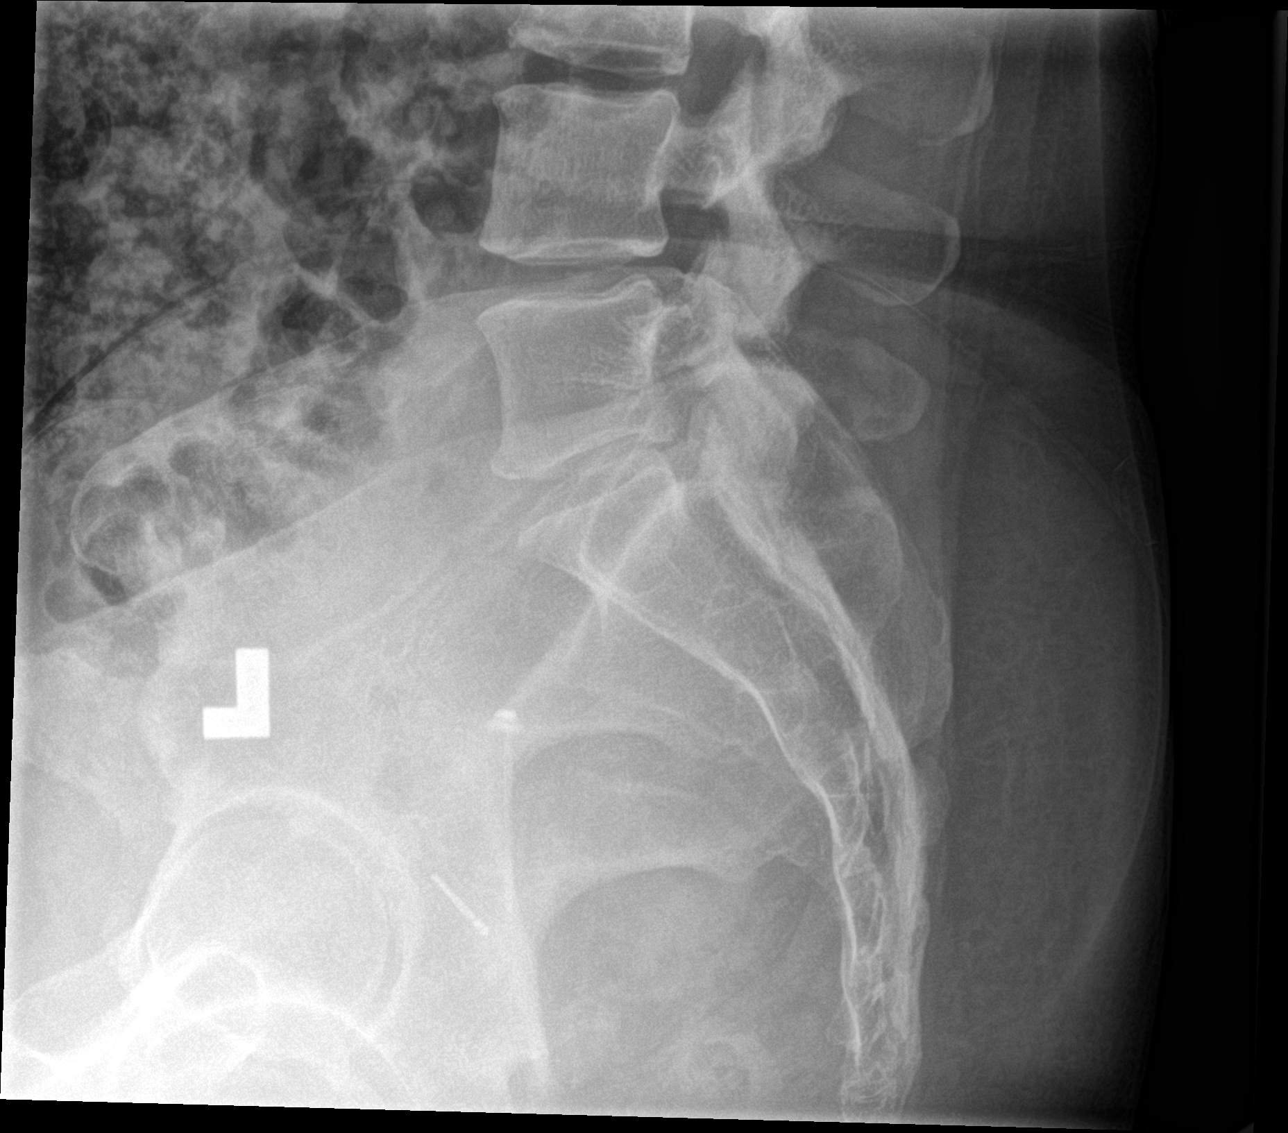

[5 of 5 positions shown; findings below may reference images not displayed]

FINDINGS: Normal lumbar lordosis. No acute fracture or listhesis of the lumbar
spine. Vertebral body height has been preserved. There is mild
intervertebral disc space narrowing and endplate remodeling of L4-5
and L5-S1 in keeping with changes of mild degenerative disc disease.
Oblique views demonstrate mild degenerative arthritis on the left at
L4-5. Possible ankylosis of the right L4-5 facet. Mild degenerative
arthritis of the right L5-S1 facet joint. The paraspinal soft
tissues are unremarkable. Intrauterine device noted within the
pelvis.
IMPRESSION: Mild degenerative disc and degenerative joint disease of the
lumbosacral junction.

## 2021-09-02 ENCOUNTER — Other Ambulatory Visit: Payer: Self-pay | Admitting: Family Medicine

## 2021-09-02 DIAGNOSIS — G4700 Insomnia, unspecified: Secondary | ICD-10-CM

## 2021-09-04 NOTE — Telephone Encounter (Signed)
Last OV: 05/11/21 Next OV: 11/04/21 Last RF: 05/24/21

## 2021-09-14 ENCOUNTER — Encounter (INDEPENDENT_AMBULATORY_CARE_PROVIDER_SITE_OTHER): Payer: PRIVATE HEALTH INSURANCE | Admitting: Sports Medicine

## 2021-09-14 DIAGNOSIS — M47816 Spondylosis without myelopathy or radiculopathy, lumbar region: Secondary | ICD-10-CM

## 2021-09-14 DIAGNOSIS — M5030 Other cervical disc degeneration, unspecified cervical region: Secondary | ICD-10-CM

## 2021-09-15 NOTE — Telephone Encounter (Signed)
I spent 5 total minutes of online digital evaluation and management services in this patient-initiated request for online care. 

## 2021-09-20 ENCOUNTER — Ambulatory Visit
Admission: RE | Admit: 2021-09-20 | Discharge: 2021-09-20 | Disposition: A | Discharge status: Home / Self Care | Payer: Self-pay | Source: Ambulatory Visit | Attending: Sports Medicine | Admitting: Sports Medicine

## 2021-09-20 DIAGNOSIS — M5030 Other cervical disc degeneration, unspecified cervical region: Secondary | ICD-10-CM

## 2021-09-20 MED ORDER — TRIAMCINOLONE ACETONIDE 40 MG/ML IJ SUSP (RADIOLOGY)
60.0000 mg | Freq: Once | INTRAMUSCULAR | Status: AC
  Administered 2021-09-20: 60 mg via EPIDURAL

## 2021-09-20 MED ORDER — IOPAMIDOL (ISOVUE-M 300) INJECTION 61%
1.0000 mL | Freq: Once | INTRAMUSCULAR | Status: AC | PRN
Start: 2021-09-20 — End: 2021-09-20
  Administered 2021-09-20: 1 mL via EPIDURAL

## 2021-09-20 NOTE — Discharge Instructions (Signed)

## 2021-10-07 ENCOUNTER — Encounter: Payer: Self-pay | Admitting: Family Medicine

## 2021-10-12 MED ORDER — GABAPENTIN 800 MG PO TABS: 800.0000 mg | ORAL_TABLET | Freq: Three times a day (TID) | ORAL | 3 refills | Status: DC

## 2021-10-12 NOTE — Addendum Note (Signed)
Addended by: Silverio Decamp on: 10/12/2021 09:11 PM   Modules accepted: Orders

## 2021-10-17 ENCOUNTER — Other Ambulatory Visit: Payer: PRIVATE HEALTH INSURANCE

## 2021-10-25 MED ORDER — ETODOLAC ER 600 MG PO TB24: 600.0000 mg | ORAL_TABLET | Freq: Every day | ORAL | 11 refills | Status: DC

## 2021-10-25 NOTE — Addendum Note (Signed)
Addended by: Silverio Decamp on: 10/25/2021 02:01 PM   Modules accepted: Orders

## 2021-11-14 ENCOUNTER — Ambulatory Visit: Payer: BC Managed Care – PPO | Admitting: Family Medicine

## 2021-12-06 ENCOUNTER — Other Ambulatory Visit: Payer: Self-pay | Admitting: Sports Medicine

## 2021-12-06 DIAGNOSIS — M47816 Spondylosis without myelopathy or radiculopathy, lumbar region: Secondary | ICD-10-CM

## 2021-12-18 ENCOUNTER — Other Ambulatory Visit: Payer: Self-pay | Admitting: Family Medicine

## 2021-12-18 DIAGNOSIS — G4700 Insomnia, unspecified: Secondary | ICD-10-CM

## 2021-12-18 NOTE — Telephone Encounter (Signed)
Please call pt she is due for an office visit for refills on Ambien and Metformin. Thanks.

## 2021-12-18 NOTE — Telephone Encounter (Signed)
Called and left voicemail for patient to call office to schedule appointment for medication refills.

## 2022-01-01 ENCOUNTER — Telehealth: Payer: Self-pay | Admitting: Family Medicine

## 2022-01-01 DIAGNOSIS — I10 Essential (primary) hypertension: Secondary | ICD-10-CM

## 2022-01-01 NOTE — Telephone Encounter (Signed)
Called patient. Left voicemail to call office to schedule appointment!

## 2022-01-01 NOTE — Telephone Encounter (Signed)
Pt will need a f/u for bp refills and labs

## 2022-01-29 ENCOUNTER — Telehealth: Payer: Self-pay

## 2022-01-29 DIAGNOSIS — M47816 Spondylosis without myelopathy or radiculopathy, lumbar region: Secondary | ICD-10-CM

## 2022-01-29 MED ORDER — GABAPENTIN 800 MG PO TABS: 800.0000 mg | ORAL_TABLET | Freq: Three times a day (TID) | ORAL | 3 refills | Status: DC

## 2022-01-29 NOTE — Telephone Encounter (Signed)
Patient needs refill on gabapentin cause a CMA cancelled her prescription and now she needs a new script please advise.

## 2022-01-29 NOTE — Addendum Note (Signed)
Addended by: Silverio Decamp on: 01/29/2022 04:40 PM   Modules accepted: Orders

## 2022-01-29 NOTE — Telephone Encounter (Signed)
Sent!

## 2022-03-12 ENCOUNTER — Telehealth: Payer: Self-pay

## 2022-03-12 NOTE — Transitions of Care (Post Inpatient/ED Visit) (Unsigned)
   03/12/2022  Name: Gianni Connel MRN: [id number] DOB: 10-19-1970  Today's TOC FU Call Status: Today's TOC FU Call Status:: Unsuccessul Call (1st Attempt) Unsuccessful Call (1st Attempt) Date: 03/12/22  Attempted to reach the patient regarding the most recent Inpatient/ED visit.  Follow Up Plan: Additional outreach attempts will be made to reach the patient to complete the Transitions of Care (Post Inpatient/ED visit) call.   Signature Juanda Crumble, [place] Direct Dial [phone]

## 2022-03-13 NOTE — Transitions of Care (Post Inpatient/ED Visit) (Signed)
   03/13/2022  Name: Carly [name] MRN: [id number] DOB: 12-29-1970  Today's TOC FU Call Status: Today's TOC FU Call Status:: Unsuccessful Call (2nd Attempt) Unsuccessful Call (1st Attempt) Date: 03/12/22 Unsuccessful Call (2nd Attempt) Date: 03/13/22  Attempted to reach the patient regarding the most recent Inpatient/ED visit.  Follow Up Plan: No further outreach attempts will be made at this time. We have been unable to contact the patient.  Signature Juanda Crumble, [place] Direct Dial [phone]

## 2022-04-17 IMAGING — XA Imaging study
2 series · 2 of 2 positions shown · non-contrast
Comparison: none

CLINICAL DATA: Lumbosacral spondylosis without myelopathy.
Excellent response to the right S1 nerve root block in [DATE] with
sustained relief of right leg pain. The patient reports recent onset
of left low back and buttock pain radiating into the posterior left
thigh. Given the absence of right-sided symptoms currently as well
as the discomfort the patient felt during the prior nerve root
block, we decided to perform a left interlaminar injection at L5-S1
today.

[Series 1: ortho standard · 1 of 1 slices shown (1 of 2)]
[im 1/1]
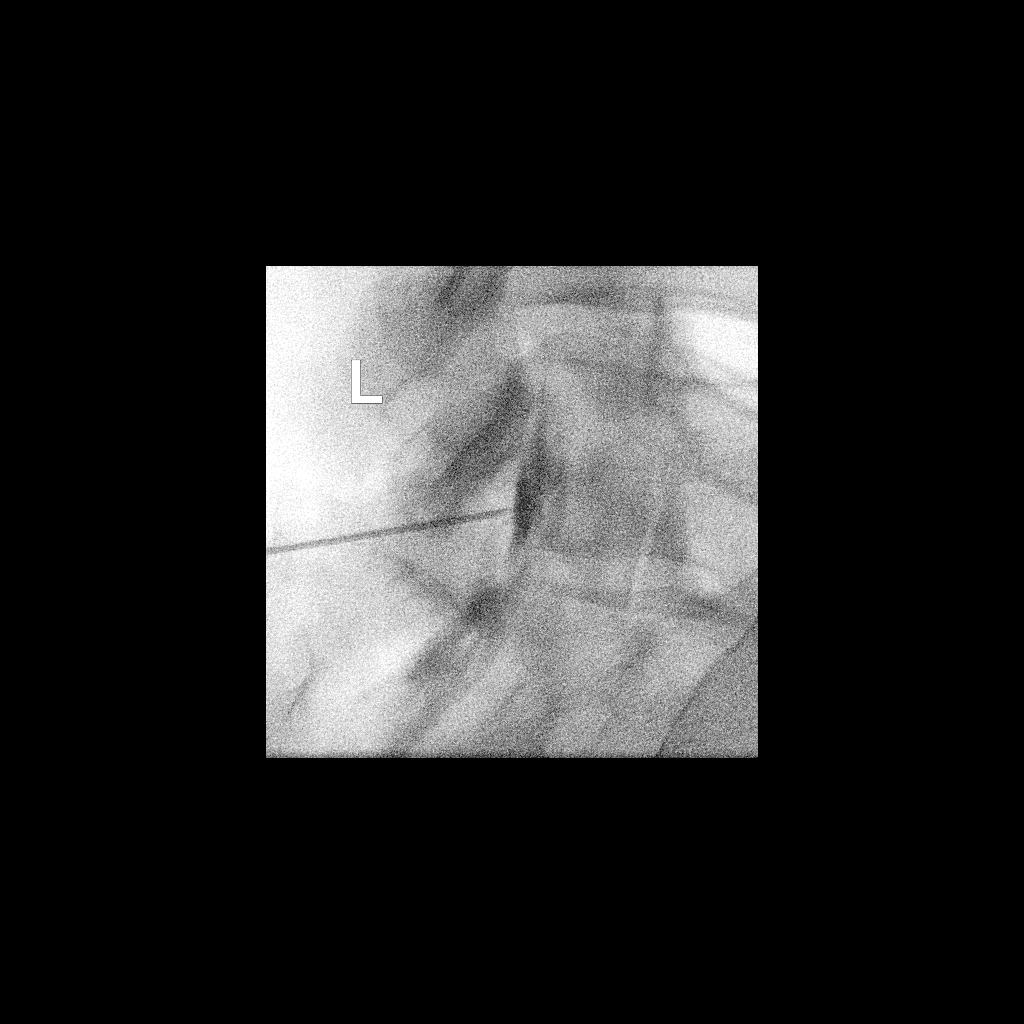

[Series 2: ortho standard · 1 of 1 slices shown (2 of 2)]
[im 1/1]
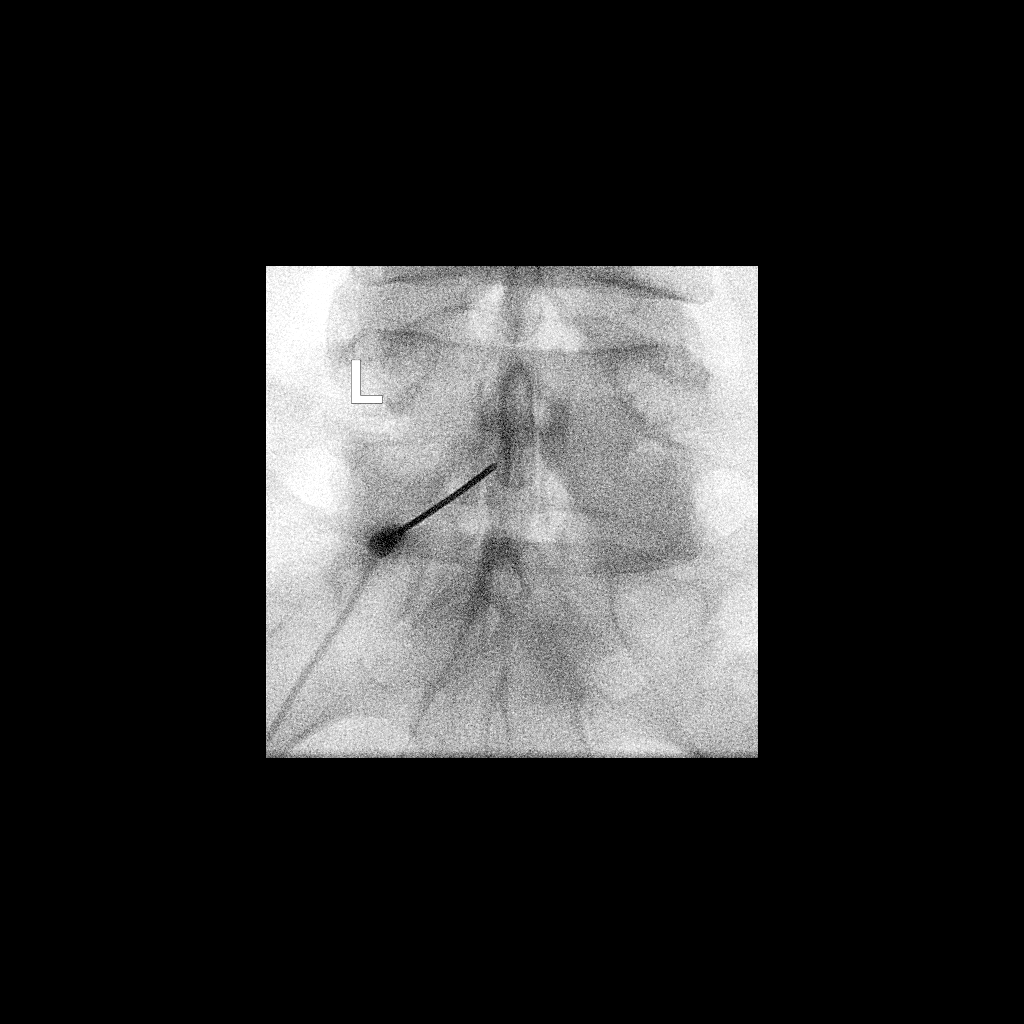

[2 of 2 positions shown; findings below may reference images not displayed]

FLUOROSCOPY TIME:  Fluoroscopy Time: 18 seconds

Radiation Exposure Index: 8.53 microGray*m^2

PROCEDURE:
The procedure, risks, benefits, and alternatives were explained to
the patient. Questions regarding the procedure were encouraged and
answered. The patient understands and consents to the procedure.

LUMBAR EPIDURAL INJECTION:

An interlaminar approach was performed on the left at L5-S1. The
overlying skin was cleansed and anesthetized. A 3.5 inch 20 gauge
epidural needle was advanced using loss-of-resistance technique.

DIAGNOSTIC EPIDURAL INJECTION:

Injection of Isovue-M 200 shows a good epidural pattern with spread
above and below the level of needle placement, primarily on the
left. No vascular opacification is seen.

THERAPEUTIC EPIDURAL INJECTION:

80 mg of Depo-Medrol mixed with 3 mL of 1% lidocaine were instilled.
The procedure was well-tolerated, and the patient was discharged
thirty minutes following the injection in good condition.

COMPLICATIONS:
None immediate
IMPRESSION: Technically successful interlaminar epidural injection on the left
at L5-S1.

## 2022-04-27 LAB — HEPATIC FUNCTION PANEL
ALT: 9 U/L (ref 7–35)
AST: 16 (ref 13–35)
Alkaline Phosphatase: 62 (ref 25–125)

## 2022-04-27 LAB — BASIC METABOLIC PANEL
BUN: 9 (ref 4–21)
Chloride: 105 (ref 99–108)
Creatinine: 1 (ref 0.5–1.1)
Glucose: 65
Potassium: 3.6 mEq/L (ref 3.5–5.1)
Sodium: 141 (ref 137–147)

## 2022-04-27 LAB — VITAMIN D 25 HYDROXY (VIT D DEFICIENCY, FRACTURES): Vit D, 25-Hydroxy: 52.1

## 2022-04-27 LAB — TSH: TSH: 2.21 (ref 0.41–5.90)

## 2022-04-27 LAB — COMPREHENSIVE METABOLIC PANEL
Calcium: 9.3 (ref 8.7–10.7)
eGFR: 67

## 2022-05-24 ENCOUNTER — Telehealth: Payer: Self-pay

## 2022-05-24 DIAGNOSIS — E039 Hypothyroidism, unspecified: Secondary | ICD-10-CM

## 2022-05-24 LAB — FOLLICLE STIMULATING HORMONE: FSH: 83.1

## 2022-05-24 LAB — DHEA: DHEA: 203

## 2022-05-24 LAB — T3, FREE: Free T3: 2.5

## 2022-05-24 LAB — PROGESTERONE: Progesterone: 4.5

## 2022-05-24 LAB — ESTRADIOL: Estradiol: 43

## 2022-05-24 LAB — THYROXINE (T4) FREE, DIRECT: T4,Free (Direct): 1.49

## 2022-05-24 MED ORDER — SYNTHROID 88 MCG PO TABS: 88.0000 ug | ORAL_TABLET | Freq: Every day | ORAL | 3 refills | Status: DC

## 2022-05-24 MED ORDER — METFORMIN HCL 1000 MG PO TABS: 1000.0000 mg | ORAL_TABLET | Freq: Two times a day (BID) | ORAL | 1 refills | Status: DC

## 2022-05-24 NOTE — Telephone Encounter (Signed)
Patient made an appointment for 06/20/2022 @ 9:30. She stated that she was out of her medications for Thyroid and needed medications to hold her over until being seen. She also left paperwork about her labs which will be placed in your basket. Tvt   SYNTHROID 88 MCG tablet   metFORMIN (GLUCOPHAGE) 1000 MG tablet    [facility] - [place], [place] - [facility] Phone: [phone]  Fax: [phone]     Phone: [phone]  Fax: [phone]

## 2022-05-24 NOTE — Addendum Note (Signed)
Addended by: Nani Gasser D on: 05/24/2022 11:15 AM   Modules accepted: Orders

## 2022-05-24 NOTE — Telephone Encounter (Signed)
Meds ordered this encounter  Medications   metFORMIN (GLUCOPHAGE) 1000 MG tablet    Sig: Take 1 tablet (1,000 mg total) by mouth 2 (two) times daily with a meal.    Dispense:  180 tablet    Refill:  1   SYNTHROID 88 MCG tablet    Sig: Take 1 tablet (88 mcg total) by mouth daily.    Dispense:  90 tablet    Refill:  3

## 2022-05-24 NOTE — Telephone Encounter (Signed)
Labs abstracted 

## 2022-05-30 ENCOUNTER — Encounter: Payer: Self-pay | Admitting: Family Medicine

## 2022-06-19 ENCOUNTER — Telehealth: Payer: Self-pay

## 2022-06-19 NOTE — Telephone Encounter (Signed)
Initiated Prior authorization [id number] Via: Covermymeds Case/Key:[id number] Status: denied as of 06/12/22 Reason:Here is a breakdown of why this request was denied. It includes the criteria used to make the decision. The provider is also getting a copy of this information. If they would like, they can call us to set up a time to talk through this decision with another provider, called a peer-to-peer review.  You may want to bring this letter with you on your next visit. The information can be very detailed. The doctor may be able to help you understand it. The denial was based on our criteria for Synthroid Tab .  Per your health plan's criteria, this drug is covered if you meet the following: (1) If the requested drug has a covered (formulary) drug with the same active ingredient, the following: (I) Your doctor provides medical records (for example: chart notes) showing you cannot use (experienced intolerance, for example: allergy to excipient) a covered (formulary equivalent) drug with the same active ingredient: Euthyrox, Levo-T, levothyroxine tablet, Levoxyl, or Unithroid. The information provided does not show that you meet the criteria listed above. Please speak with your doctor about your choices. Notified Pt via: Mychart

## 2022-06-20 ENCOUNTER — Encounter: Payer: Self-pay | Admitting: Family Medicine

## 2022-06-20 ENCOUNTER — Ambulatory Visit (INDEPENDENT_AMBULATORY_CARE_PROVIDER_SITE_OTHER): Payer: PRIVATE HEALTH INSURANCE | Admitting: Family Medicine

## 2022-06-20 VITALS — BP 129/77 | HR 96 | Ht 64.0 in | Wt 150.0 lb

## 2022-06-20 DIAGNOSIS — I10 Essential (primary) hypertension: Secondary | ICD-10-CM | POA: Diagnosis not present

## 2022-06-20 DIAGNOSIS — Z1211 Encounter for screening for malignant neoplasm of colon: Secondary | ICD-10-CM

## 2022-06-20 DIAGNOSIS — Z1231 Encounter for screening mammogram for malignant neoplasm of breast: Secondary | ICD-10-CM

## 2022-06-20 DIAGNOSIS — Z79890 Hormone replacement therapy (postmenopausal): Secondary | ICD-10-CM | POA: Diagnosis not present

## 2022-06-20 DIAGNOSIS — G4700 Insomnia, unspecified: Secondary | ICD-10-CM

## 2022-06-20 DIAGNOSIS — E039 Hypothyroidism, unspecified: Secondary | ICD-10-CM

## 2022-06-20 MED ORDER — BELSOMRA 10 MG PO TABS: 1.0000 | ORAL_TABLET | Freq: Every day | ORAL | 1 refills | Status: DC

## 2022-06-20 MED ORDER — SYNTHROID 88 MCG PO TABS: 88.0000 ug | ORAL_TABLET | Freq: Every day | ORAL | 3 refills | Status: DC

## 2022-06-20 NOTE — Assessment & Plan Note (Signed)
Will try belsomra.

## 2022-06-20 NOTE — Assessment & Plan Note (Signed)
BP at goal 

## 2022-06-20 NOTE — Progress Notes (Signed)
Established Patient Office Visit  Subjective   Patient ID: Carly [name], female    DOB: 04/10/1970  Age: 52 y.o. MRN: [id number]  Chief Complaint  Patient presents with   Hypothyroidism   Hypertension    HPI  Hypertension- Pt denies chest pain, SOB, dizziness, or heart palpitations.  Taking meds as directed w/o problems.  Denies medication side effects.    Hypothyroidism - Taking medication regularly in the AM away from food and vitamins, etc. No recent change to skin, hair, or energy levels.  Last TSH was 2.2 on April 5.  She did miss maybe a week or 2 of her medication because she had difficulty getting it and it was quite expensive unfortunately the cost went up significantly at her local pharmacy.  She had her neck surgery earlier this year and actually did really well in fact she is now fully released for exercise.  She also wanted let me know that she is doing HRT with a company called [employer].  She is doing pellet hormone replacement and topical testosterone.  They recently adjusted her dose.  She did do a consultation with a nutritionist as well as she has been getting a little bit more weight around the middle and would like to work on that.  Is working on increasing her protein intake.  She is also been sick with a sinusitis for about 6 days and is currently on Augmentin and prednisone.  She reports she still struggling with her chronic insomnia it is better than it was she is getting about 4 to 5 hours.  She came off the Ambien last fall.  But it is making it difficult when she works 12-hour shifts to not feel completely exhausted.    ROS    Objective:     BP 129/77   Pulse 96   Ht 5\' 4"  (1.626 m)   Wt 150 lb (68 kg)   SpO2 99%   BMI 25.75 kg/m    Physical Exam Vitals and nursing note reviewed.  Constitutional:      Appearance: She is well-developed.  HENT:     Head: Normocephalic and atraumatic.  Neck:     Comments: Incision healing  well Cardiovascular:     Rate and Rhythm: Normal rate and regular rhythm.     Heart sounds: Normal heart sounds.  Pulmonary:     Effort: Pulmonary effort is normal.     Breath sounds: Normal breath sounds.  Skin:    General: Skin is warm and dry.  Neurological:     Mental Status: She is alert and oriented to person, place, and time.  Psychiatric:        Behavior: Behavior normal.     No results found for any visits on 06/20/22.    The 10-year ASCVD risk score (Arnett DK, et al., 2019) is: 2.3%    Assessment & Plan:   Problem List Items Addressed This Visit       Cardiovascular and Mediastinum   Essential hypertension, benign - Primary    BP at goal        Endocrine   Hypothyroid    TSH at goal.  Will send to [facility] order. She has already signed up online.        Relevant Medications   SYNTHROID 88 MCG tablet     Other   INSOMNIA, CHRONIC    Will try belsomra.        Relevant Medications   Suvorexant (BELSOMRA)  10 MG TABS   Hormone replacement therapy (HRT)    Working with [employer] for HRT.  Will need mammo yearly.  Had IUD removed last summer.       Other Visit Diagnoses     Screening for malignant neoplasm of colon       Relevant Orders   Ambulatory referral to Gastroenterology   Screening mammogram for breast cancer       Relevant Orders   MM 3D SCREENING MAMMOGRAM BILATERAL BREAST       Return in about 6 months (around 12/21/2022) for thyroid,insomnia.    Nani Gasser, MD

## 2022-06-20 NOTE — Assessment & Plan Note (Signed)
TSH at goal.  Will send to [facility] Mail order. She has already signed up online.

## 2022-06-20 NOTE — Assessment & Plan Note (Signed)
Working with [employer] for HRT.  Will need mammo yearly.  Had IUD removed last summer.

## 2022-07-05 ENCOUNTER — Ambulatory Visit (INDEPENDENT_AMBULATORY_CARE_PROVIDER_SITE_OTHER): Payer: PRIVATE HEALTH INSURANCE | Admitting: Sports Medicine

## 2022-07-05 DIAGNOSIS — M47816 Spondylosis without myelopathy or radiculopathy, lumbar region: Secondary | ICD-10-CM | POA: Diagnosis not present

## 2022-07-05 NOTE — Assessment & Plan Note (Signed)
Carly [name] returns, she is a pleasant 52 year old female, she has not gotten relief from SI joint injections, L5-S1 interlaminar epidurals have however done fantastic, her last epidural was over a year ago, ordering a repeat lumbar epidural, return as needed.

## 2022-07-05 NOTE — Progress Notes (Signed)
    Procedures performed today:    None.  Independent interpretation of notes and tests performed by another provider:   None.  Brief History, Exam, Impression, and Recommendations:    Lumbar spondylosis Carly [name] returns, she is a pleasant 52 year old female, she has not gotten relief from SI joint injections, L5-S1 interlaminar epidurals have however done fantastic, her last epidural was over a year ago, ordering a repeat lumbar epidural, return as needed.  I spent 30 minutes of total time managing this patient today, this includes chart review, face to face, and non-face to face time.  ____________________________________________ Ihor Austin. Benjamin Stain, M.D., ABFM., CAQSM., AME. Primary Care and Sports Medicine [facility]  Adjunct Professor of Family Medicine  [employer]  Restaurant manager, fast food

## 2022-07-18 ENCOUNTER — Ambulatory Visit
Admission: RE | Admit: 2022-07-18 | Discharge: 2022-07-18 | Disposition: A | Discharge status: Home / Self Care | Payer: PRIVATE HEALTH INSURANCE | Source: Ambulatory Visit | Attending: Sports Medicine | Admitting: Sports Medicine

## 2022-07-18 DIAGNOSIS — M47816 Spondylosis without myelopathy or radiculopathy, lumbar region: Secondary | ICD-10-CM

## 2022-07-18 MED ORDER — IOPAMIDOL (ISOVUE-M 200) INJECTION 41%
1.0000 mL | Freq: Once | INTRAMUSCULAR | Status: AC
  Administered 2022-07-18: 1 mL via EPIDURAL

## 2022-07-18 MED ORDER — METHYLPREDNISOLONE ACETATE 40 MG/ML INJ SUSP (RADIOLOG
80.0000 mg | Freq: Once | INTRAMUSCULAR | Status: AC
  Administered 2022-07-18: 80 mg via EPIDURAL

## 2022-07-18 NOTE — Discharge Instructions (Signed)

## 2022-08-23 ENCOUNTER — Encounter: Payer: Self-pay | Admitting: Family Medicine

## 2022-08-27 ENCOUNTER — Other Ambulatory Visit: Payer: Self-pay | Admitting: Family Medicine

## 2022-08-27 DIAGNOSIS — G4700 Insomnia, unspecified: Secondary | ICD-10-CM

## 2022-08-27 MED ORDER — BELSOMRA 20 MG PO TABS: 20.0000 mg | ORAL_TABLET | Freq: Every day | ORAL | 1 refills | Status: DC

## 2022-08-27 NOTE — Telephone Encounter (Signed)
Meds ordered this encounter  Medications   Suvorexant (BELSOMRA) 20 MG TABS    Sig: Take 1 tablet (20 mg total) by mouth at bedtime.    Dispense:  30 tablet    Refill:  1

## 2022-09-18 LAB — HM MAMMOGRAPHY

## 2022-10-03 ENCOUNTER — Encounter: Payer: Self-pay | Admitting: Family Medicine

## 2022-10-27 ENCOUNTER — Other Ambulatory Visit: Payer: Self-pay | Admitting: Family Medicine

## 2022-12-24 ENCOUNTER — Ambulatory Visit (INDEPENDENT_AMBULATORY_CARE_PROVIDER_SITE_OTHER): Payer: PRIVATE HEALTH INSURANCE | Admitting: Family Medicine

## 2022-12-24 ENCOUNTER — Encounter: Payer: Self-pay | Admitting: Family Medicine

## 2022-12-24 VITALS — BP 132/75 | HR 64 | Ht 64.0 in | Wt 145.0 lb

## 2022-12-24 DIAGNOSIS — G4700 Insomnia, unspecified: Secondary | ICD-10-CM | POA: Diagnosis not present

## 2022-12-24 DIAGNOSIS — Z79890 Hormone replacement therapy (postmenopausal): Secondary | ICD-10-CM | POA: Diagnosis not present

## 2022-12-24 DIAGNOSIS — I10 Essential (primary) hypertension: Secondary | ICD-10-CM

## 2022-12-24 DIAGNOSIS — E039 Hypothyroidism, unspecified: Secondary | ICD-10-CM

## 2022-12-24 DIAGNOSIS — N390 Urinary tract infection, site not specified: Secondary | ICD-10-CM

## 2022-12-24 MED ORDER — NITROFURANTOIN MONOHYD MACRO 100 MG PO CAPS: ORAL_CAPSULE | ORAL | 2 refills | Status: AC

## 2022-12-24 MED ORDER — METFORMIN HCL 1000 MG PO TABS: 1000.0000 mg | ORAL_TABLET | Freq: Two times a day (BID) | ORAL | 3 refills | Status: DC

## 2022-12-24 NOTE — Progress Notes (Signed)
Established Patient Office Visit  Subjective   Patient ID: Carly [name], female    DOB: 11/18/1970  Age: 52 y.o. MRN: [id number]  Chief Complaint  Patient presents with   Insomnia    Is now using Magnesium Butter, sleep mediation music at night time    HPI  Follow-up insomnia-also using a topical butter magnesium for the last few nights and does feel like it is actually been helpful.  She is currently still taking Belsomra 20 mg.  Still taking the melatonin and recently upped her evening progesterone and that seems to be working really well her biggest struggle at our days where she works 12-hour shifts and she has to get up early the next day.  Hypothyroidism - Taking medication regularly in the AM away from food and vitamins, etc. No recent change to skin, hair, or energy levels.  Has flu shot at work  Is on HRT she gets an estrogen pellet and then uses progesterone she says it made a big difference in her day-to-day symptoms including focus, brain fog, tearfulness and weight gain she is also been exercising daily and she is also been putting a lot of energy and effort into trying to improve her sleep quality.  She is completely cut out caffeine and has made some great changes with her diet.    ROS    Objective:     BP 132/75 Comment: Repeat BP  Pulse 64   Ht 5\' 4"  (1.626 m)   Wt 145 lb (65.8 kg)   SpO2 99%   BMI 24.89 kg/m    Physical Exam Vitals and nursing note reviewed.  Constitutional:      Appearance: Normal appearance.  HENT:     Head: Normocephalic and atraumatic.  Eyes:     Conjunctiva/sclera: Conjunctivae normal.  Cardiovascular:     Rate and Rhythm: Normal rate and regular rhythm.  Pulmonary:     Effort: Pulmonary effort is normal.     Breath sounds: Normal breath sounds.  Skin:    General: Skin is warm and dry.  Neurological:     Mental Status: She is alert.  Psychiatric:        Mood and Affect: Mood normal.      No results found for  any visits on 12/24/22.    The 10-year ASCVD risk score (Arnett DK, et al., 2019) is: 1.8%    Assessment & Plan:   Problem List Items Addressed This Visit       Cardiovascular and Mediastinum   Essential hypertension, benign    Pressure okay today just a little borderline.  She is currently off of the losartan we will keep an eye on it for now she has been getting some good blood pressures at home.      Relevant Orders   Lipid panel   CMP14+EGFR     Endocrine   Hypothyroid - Primary    Right now her thyroid is being managed by the provider that is doing her hormone replacement therapy.  She was recently started on Cytomel 5 mcg daily because T3 was a little low.      Relevant Medications   liothyronine (CYTOMEL) 5 MCG tablet   Other Relevant Orders   Lipid panel   CMP14+EGFR     Genitourinary   Recurrent UTI    Uses Macrobid postcoitally.  Refill sent to pharmacy.      Relevant Medications   nitrofurantoin, macrocrystal-monohydrate, (MACROBID) 100 MG capsule     Other  INSOMNIA, CHRONIC    on a pretty good range of regimen right now with topical magnesium, oral melatonin, oral progesterone,, Belsomra 20 mg and avoiding caffeine.      Hormone replacement therapy (HRT)    Tradjenta and progesterone.  Will need mammogram yearly.  Mammogram is up-to-date per done in August 2024      Reminded patient to follow-up for her next colonoscopy.  Return in about 6 months (around 06/24/2023).    Nani Gasser, MD

## 2022-12-24 NOTE — Assessment & Plan Note (Signed)
Right now her thyroid is being managed by the provider that is doing her hormone replacement therapy.  She was recently started on Cytomel 5 mcg daily because T3 was a little low.

## 2022-12-24 NOTE — Assessment & Plan Note (Signed)
Tradjenta and progesterone.  Will need mammogram yearly.  Mammogram is up-to-date per done in August 2024

## 2022-12-24 NOTE — Assessment & Plan Note (Signed)
Pressure okay today just a little borderline.  She is currently off of the losartan we will keep an eye on it for now she has been getting some good blood pressures at home.

## 2022-12-24 NOTE — Assessment & Plan Note (Signed)
on a pretty good range of regimen right now with topical magnesium, oral melatonin, oral progesterone,, Belsomra 20 mg and avoiding caffeine.

## 2022-12-24 NOTE — Assessment & Plan Note (Signed)
Uses Macrobid postcoitally.  Refill sent to pharmacy.

## 2022-12-25 ENCOUNTER — Encounter: Payer: Self-pay | Admitting: Family Medicine

## 2022-12-25 LAB — LIPID PANEL
Chol/HDL Ratio: 4.9 {ratio} — ABNORMAL HIGH (ref 0.0–4.4)
Cholesterol, Total: 209 mg/dL — ABNORMAL HIGH (ref 100–199)
HDL: 43 mg/dL (ref >39)
LDL Chol Calc (NIH): 146 mg/dL — ABNORMAL HIGH (ref 0–99)
Triglycerides: 111 mg/dL (ref 0–149)
VLDL Cholesterol Cal: 20 mg/dL (ref 5–40)

## 2022-12-25 LAB — CMP14+EGFR
ALT: 37 [IU]/L — ABNORMAL HIGH (ref 0–32)
AST: 34 [IU]/L (ref 0–40)
Albumin: 4.4 g/dL (ref 3.8–4.9)
Alkaline Phosphatase: 51 [IU]/L (ref 44–121)
BUN/Creatinine Ratio: 12 (ref 9–23)
BUN: 12 mg/dL (ref 6–24)
Bilirubin Total: 0.3 mg/dL (ref 0.0–1.2)
CO2: 22 mmol/L (ref 20–29)
Calcium: 9.1 mg/dL (ref 8.7–10.2)
Chloride: 107 mmol/L — ABNORMAL HIGH (ref 96–106)
Creatinine, Ser: 0.97 mg/dL (ref 0.57–1.00)
Globulin, Total: 2.3 g/dL (ref 1.5–4.5)
Glucose: 88 mg/dL (ref 70–99)
Potassium: 4.4 mmol/L (ref 3.5–5.2)
Sodium: 143 mmol/L (ref 134–144)
Total Protein: 6.7 g/dL (ref 6.0–8.5)
eGFR: 70 mL/min/{1.73_m2} (ref >59)

## 2022-12-25 NOTE — Progress Notes (Signed)
Hi Carly [name], LDL cholesterol and total cholesterol are elevated encourage you to continue to work on healthy diet and regular exercise.  Your metabolic panel overall looks good except your ALT which is one of the liver enzymes is mildly elevated.  I do want a keep an eye on that and plan to recheck again in about 6 to 8 weeks.  Make sure avoiding any excess Tylenol or alcohol products that might cause some inflammation in the liver.  Also make sure you are eating healthy as that can prove nflammation of the liver as well.

## 2022-12-26 ENCOUNTER — Other Ambulatory Visit: Payer: Self-pay | Admitting: *Deleted

## 2022-12-26 DIAGNOSIS — R748 Abnormal levels of other serum enzymes: Secondary | ICD-10-CM

## 2023-01-01 ENCOUNTER — Encounter (INDEPENDENT_AMBULATORY_CARE_PROVIDER_SITE_OTHER): Payer: PRIVATE HEALTH INSURANCE | Admitting: Sports Medicine

## 2023-01-01 DIAGNOSIS — M47816 Spondylosis without myelopathy or radiculopathy, lumbar region: Secondary | ICD-10-CM | POA: Diagnosis not present

## 2023-01-01 DIAGNOSIS — M5030 Other cervical disc degeneration, unspecified cervical region: Secondary | ICD-10-CM

## 2023-01-01 NOTE — Telephone Encounter (Addendum)
Please see the MyChart message reply(ies) for my assessment and plan.    This patient gave consent for this Medical Advice Message and is aware that it may result in a bill to [employer], as well as the possibility of receiving a bill for a co-payment or deductible. They are an established patient, but are not seeking medical advice exclusively about a problem treated during an in person or video visit in the last seven days. I did not recommend an in person or video visit within seven days of my reply.  I spent 11 total minutes of online digital evaluation and management services in this patient-initiated request for online care.

## 2023-01-03 MED ORDER — TRAMADOL HCL 50 MG PO TABS: 50.0000 mg | ORAL_TABLET | Freq: Three times a day (TID) | ORAL | 0 refills | Status: DC | PRN

## 2023-01-03 MED ORDER — TIZANIDINE HCL 4 MG PO TABS: 4.0000 mg | ORAL_TABLET | Freq: Three times a day (TID) | ORAL | 2 refills | Status: DC | PRN

## 2023-01-03 NOTE — Addendum Note (Signed)
Addended by: Monica Becton on: 01/03/2023 04:25 PM   Modules accepted: Orders

## 2023-01-04 MED ORDER — TIZANIDINE HCL 4 MG PO TABS: 4.0000 mg | ORAL_TABLET | Freq: Three times a day (TID) | ORAL | 2 refills | Status: DC | PRN

## 2023-01-04 MED ORDER — TRAMADOL HCL 50 MG PO TABS: 50.0000 mg | ORAL_TABLET | Freq: Three times a day (TID) | ORAL | 0 refills | Status: DC | PRN

## 2023-01-04 NOTE — Addendum Note (Signed)
Addended by: Monica Becton on: 01/04/2023 01:10 PM   Modules accepted: Orders

## 2023-01-04 NOTE — Addendum Note (Signed)
Addended by: Chalmers Cater on: 01/04/2023 07:05 AM   Modules accepted: Orders

## 2023-01-14 NOTE — Discharge Instructions (Signed)

## 2023-01-17 ENCOUNTER — Ambulatory Visit
Admission: RE | Admit: 2023-01-17 | Discharge: 2023-01-17 | Disposition: A | Discharge status: Home / Self Care | Payer: Self-pay | Source: Ambulatory Visit | Attending: Sports Medicine | Admitting: Sports Medicine

## 2023-01-17 DIAGNOSIS — M47816 Spondylosis without myelopathy or radiculopathy, lumbar region: Secondary | ICD-10-CM

## 2023-01-17 MED ORDER — METHYLPREDNISOLONE ACETATE 40 MG/ML INJ SUSP (RADIOLOG
80.0000 mg | Freq: Once | INTRAMUSCULAR | Status: AC
  Administered 2023-01-17: 80 mg via EPIDURAL

## 2023-01-17 MED ORDER — IOPAMIDOL (ISOVUE-M 200) INJECTION 41%
1.0000 mL | Freq: Once | INTRAMUSCULAR | Status: AC
  Administered 2023-01-17: 1 mL via EPIDURAL

## 2023-02-01 ENCOUNTER — Encounter: Payer: Self-pay | Admitting: Family Medicine

## 2023-04-08 ENCOUNTER — Other Ambulatory Visit: Payer: Self-pay | Admitting: Family Medicine

## 2023-04-09 NOTE — Telephone Encounter (Signed)
 Last OV 12/24/2022 Return in about 6 months (around 06/24/2023).

## 2023-05-13 ENCOUNTER — Encounter (INDEPENDENT_AMBULATORY_CARE_PROVIDER_SITE_OTHER): Payer: PRIVATE HEALTH INSURANCE | Admitting: Sports Medicine

## 2023-05-13 DIAGNOSIS — M47816 Spondylosis without myelopathy or radiculopathy, lumbar region: Secondary | ICD-10-CM

## 2023-05-14 NOTE — Telephone Encounter (Signed)

## 2023-05-15 ENCOUNTER — Other Ambulatory Visit: Payer: Self-pay | Admitting: Family Medicine

## 2023-05-15 DIAGNOSIS — E039 Hypothyroidism, unspecified: Secondary | ICD-10-CM

## 2023-05-21 NOTE — Discharge Instructions (Signed)

## 2023-05-22 ENCOUNTER — Ambulatory Visit
Admission: RE | Admit: 2023-05-22 | Discharge: 2023-05-22 | Disposition: A | Discharge status: Home / Self Care | Payer: PRIVATE HEALTH INSURANCE | Source: Ambulatory Visit | Attending: Sports Medicine | Admitting: Sports Medicine

## 2023-05-22 DIAGNOSIS — M47816 Spondylosis without myelopathy or radiculopathy, lumbar region: Secondary | ICD-10-CM

## 2023-05-22 MED ORDER — IOPAMIDOL (ISOVUE-M 200) INJECTION 41%
1.0000 mL | Freq: Once | INTRAMUSCULAR | Status: AC
  Administered 2023-05-22: 1 mL via EPIDURAL

## 2023-05-22 MED ORDER — METHYLPREDNISOLONE ACETATE 40 MG/ML INJ SUSP (RADIOLOG
80.0000 mg | Freq: Once | INTRAMUSCULAR | Status: AC
  Administered 2023-05-22: 80 mg via EPIDURAL

## 2023-06-21 ENCOUNTER — Encounter: Payer: Self-pay | Admitting: Sports Medicine

## 2023-06-25 ENCOUNTER — Encounter (INDEPENDENT_AMBULATORY_CARE_PROVIDER_SITE_OTHER): Payer: PRIVATE HEALTH INSURANCE | Admitting: Sports Medicine

## 2023-06-25 ENCOUNTER — Ambulatory Visit: Payer: PRIVATE HEALTH INSURANCE | Admitting: Family Medicine

## 2023-06-25 DIAGNOSIS — M47816 Spondylosis without myelopathy or radiculopathy, lumbar region: Secondary | ICD-10-CM

## 2023-06-27 NOTE — Addendum Note (Signed)
 Addended by: Gean Keels on: 06/27/2023 01:02 PM   Modules accepted: Orders

## 2023-06-27 NOTE — Telephone Encounter (Signed)

## 2023-06-27 NOTE — Addendum Note (Signed)
 Addended by: Gean Keels on: 06/27/2023 03:01 PM   Modules accepted: Orders

## 2023-07-01 ENCOUNTER — Encounter: Payer: Self-pay | Admitting: Family Medicine

## 2023-07-01 NOTE — Telephone Encounter (Signed)
Ok to allow early refill.

## 2023-07-02 NOTE — Telephone Encounter (Signed)
 Left detailed voicemail message on pharmacy voice mail allowing early refill of Belsomra . Left our return contact information if pharmacy had any questions.

## 2023-07-03 ENCOUNTER — Encounter: Payer: Self-pay | Admitting: Sports Medicine

## 2023-07-03 DIAGNOSIS — M5030 Other cervical disc degeneration, unspecified cervical region: Secondary | ICD-10-CM

## 2023-07-03 MED ORDER — TRAMADOL HCL 50 MG PO TABS: 50.0000 mg | ORAL_TABLET | Freq: Three times a day (TID) | ORAL | 0 refills | Status: AC | PRN

## 2023-07-03 NOTE — Telephone Encounter (Signed)
 Patient requesting rx rf of tramadol  50mg  Last written 01/04/2023 Last OV 12/24/2022 Upcoming appt = none

## 2023-08-05 ENCOUNTER — Telehealth: Payer: Self-pay

## 2023-08-05 ENCOUNTER — Encounter: Payer: Self-pay | Admitting: Family Medicine

## 2023-08-05 DIAGNOSIS — E611 Iron deficiency: Secondary | ICD-10-CM

## 2023-08-05 DIAGNOSIS — K921 Melena: Secondary | ICD-10-CM

## 2023-08-05 NOTE — Telephone Encounter (Signed)
 Copied from CRM [id number]. Topic: Clinical - Medical Advice >> Aug 05, 2023  9:33 AM Laurier BROCKS wrote: Reason for CRM: Patient is needing a order sent over to [facility] for her MRI

## 2023-08-05 NOTE — Telephone Encounter (Signed)
 Carly [name], is this something you can assist with ?

## 2023-08-05 NOTE — Telephone Encounter (Signed)
 The referral for spinal MRI? If yes, yes I can send it

## 2023-08-06 NOTE — Telephone Encounter (Signed)
 Orders Placed This Encounter  Procedures   Ambulatory referral to Hematology / Oncology    Referral Priority:   Routine    Referral Reason:   Specialty Services Required    Requested Specialty:   Oncology    Number of Visits Requested:   1   Ambulatory referral to Gastroenterology    Referral Priority:   Routine    Referral Type:   Consultation    Referral Reason:   Specialty Services Required    Number of Visits Requested:   1

## 2023-08-06 NOTE — Addendum Note (Signed)
 Addended by: Shatiqua Heroux H on: 08/06/2023 07:34 AM   Modules accepted: Orders

## 2023-08-06 NOTE — Addendum Note (Signed)
 Addended by: Justina Bertini D on: 08/06/2023 08:10 AM   Modules accepted: Orders

## 2023-08-07 NOTE — Telephone Encounter (Signed)
 Attempted call to patient to verify that this was the request for the lumbar MRI. Left a voice mail message requesting a return call.

## 2023-08-11 ENCOUNTER — Encounter: Payer: Self-pay | Admitting: Family Medicine

## 2023-08-15 NOTE — Telephone Encounter (Signed)
 Okay thank you

## 2023-08-15 NOTE — Telephone Encounter (Signed)
 Patient states was seen by neurosurgery and MRI was ordered there and is scheduled for next Wednesday 08/21/2023. Gave patient contact information for referrals for GI and Hematology .

## 2023-08-15 NOTE — Telephone Encounter (Signed)
 Patient seen in office with whitney crain today .08/15/23

## 2023-09-03 ENCOUNTER — Other Ambulatory Visit: Payer: Self-pay | Admitting: *Deleted

## 2023-09-03 DIAGNOSIS — M5030 Other cervical disc degeneration, unspecified cervical region: Secondary | ICD-10-CM

## 2023-09-03 MED ORDER — TIZANIDINE HCL 4 MG PO TABS
4.0000 mg | ORAL_TABLET | Freq: Three times a day (TID) | ORAL | 2 refills | Status: DC | PRN
Start: 2023-09-03 — End: 2023-12-02

## 2023-09-09 LAB — HM MAMMOGRAPHY

## 2023-09-24 ENCOUNTER — Encounter: Payer: Self-pay | Admitting: Sports Medicine

## 2023-10-11 ENCOUNTER — Telehealth: Payer: Self-pay

## 2023-10-11 MED ORDER — BELSOMRA 20 MG PO TABS: 20.0000 mg | ORAL_TABLET | Freq: Every day | ORAL | 1 refills | Status: DC

## 2023-10-11 NOTE — Telephone Encounter (Signed)
 BELSOMRA  Last OV: 12/24/22 Next OV: no appointment scheduled Last RF: 04/10/23

## 2023-10-14 ENCOUNTER — Other Ambulatory Visit: Payer: Self-pay | Admitting: *Deleted

## 2023-10-14 MED ORDER — BELSOMRA 20 MG PO TABS: 20.0000 mg | ORAL_TABLET | Freq: Every day | ORAL | 1 refills | Status: DC

## 2023-10-14 NOTE — Telephone Encounter (Signed)
  Meds ordered this encounter  Medications   DISCONTD: Suvorexant  (BELSOMRA ) 20 MG TABS    Sig: Take 1 tablet (20 mg total) by mouth at bedtime.    Dispense:  90 tablet    Refill:  1   Suvorexant  (BELSOMRA ) 20 MG TABS    Sig: Take 1 tablet (20 mg total) by mouth at bedtime.    Dispense:  90 tablet    Refill:  1

## 2023-10-14 NOTE — Telephone Encounter (Signed)
 error

## 2023-10-14 NOTE — Telephone Encounter (Unsigned)
 Copied from CRM [id number]. Topic: Clinical - Prescription Issue >> Oct 14, 2023 11:27 AM Susanna ORN wrote: Reason for CRM: Patient called in stating that Dr. Alvan sent her Suvorexant  (BELSOMRA ) 20 MG TABS to [facility]. 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 Wants the prescription to be sent to [facility] in [place], [place]. States she also hasn't updated her insurance but stated she will do that when she come in.

## 2023-10-14 NOTE — Addendum Note (Signed)
 Addended by: Careen Mauch D on: 10/14/2023 01:08 PM   Modules accepted: Orders

## 2023-10-22 ENCOUNTER — Encounter: Payer: Self-pay | Admitting: Family Medicine

## 2023-10-22 MED ORDER — BELSOMRA 20 MG PO TABS: 20.0000 mg | ORAL_TABLET | Freq: Every day | ORAL | 1 refills | Status: AC

## 2023-10-22 NOTE — Telephone Encounter (Signed)
 Spoke with [facility] and backed out system   In speaking with pharmacist at [facility] [place] -  she was looking at the [place] prescription and the computer crashed. She lost the current prescription  She is requesting that the prescription be resent to the pharmacy.

## 2023-10-23 NOTE — Telephone Encounter (Signed)
 Last read by Montie Hawk Cyndi at 8:25AM on 10/23/2023.

## 2023-12-02 ENCOUNTER — Other Ambulatory Visit: Payer: Self-pay | Admitting: *Deleted

## 2023-12-02 DIAGNOSIS — M5030 Other cervical disc degeneration, unspecified cervical region: Secondary | ICD-10-CM

## 2023-12-02 MED ORDER — TIZANIDINE HCL 4 MG PO TABS: 4.0000 mg | ORAL_TABLET | Freq: Three times a day (TID) | ORAL | 2 refills | Status: AC | PRN

## 2024-01-06 ENCOUNTER — Ambulatory Visit: Admitting: Family Medicine

## 2024-01-06 ENCOUNTER — Encounter: Payer: Self-pay | Admitting: Family Medicine

## 2024-01-06 VITALS — BP 127/84 | HR 84 | Ht 64.0 in | Wt 156.6 lb

## 2024-01-06 DIAGNOSIS — Z0000 Encounter for general adult medical examination without abnormal findings: Secondary | ICD-10-CM | POA: Diagnosis not present

## 2024-01-06 DIAGNOSIS — Z1211 Encounter for screening for malignant neoplasm of colon: Secondary | ICD-10-CM | POA: Diagnosis not present

## 2024-01-06 DIAGNOSIS — I10 Essential (primary) hypertension: Secondary | ICD-10-CM | POA: Diagnosis not present

## 2024-01-06 DIAGNOSIS — L989 Disorder of the skin and subcutaneous tissue, unspecified: Secondary | ICD-10-CM | POA: Diagnosis not present

## 2024-01-06 MED ORDER — LOSARTAN POTASSIUM 50 MG PO TABS: 25.0000 mg | ORAL_TABLET | Freq: Every day | ORAL | 1 refills | Status: AC

## 2024-01-06 NOTE — Progress Notes (Unsigned)
 Complete physical exam  Patient: Carly [name]    DOB: [birth year] 53 y.o.   MRN: [id number]  Chief Complaint  Patient presents with   Annual Exam    Subjective:    Carly [name] is a 53 y.o. female who presents today for a complete physical exam. She reports consuming a general diet. {types:19826} She generally feels {DESC; WELL/FAIRLY WELL/POORLY:18703}. She reports sleeping {DESC; WELL/FAIRLY WELL/POORLY:18703}. She {does/does not:200015} have additional problems to discuss today.   Discussed the use of AI scribe software for clinical note transcription with the patient, who gave verbal consent to proceed.  History of Present Illness Carly [name] is a 53 year old female who presents for an annual physical exam and evaluation of a mole on her back.  Pruritic back lesion - Mole on back present since October - Lesion is pruritic and does not resemble other seborrheic keratoses - Lesion has not healed since an insect bite while camping - No recent dermatology evaluation in the past few years - History of atypical dermatologic presentations  Postoperative spinal pain and recovery - History of neck fusion surgery in 2024 and laminotomy in September 2025 - Postoperative course included significant pain and muscle spasms around the spine - Muscle spasms managed with torasemide - Currently pain-free with significant improvement in quality of life  Hypertension and cardiovascular symptoms - Recent blood pressure reading of 140/96 - Previously discontinued antihypertensive medication due to low blood pressure readings - Recent increase in blood pressure and weight gain attributed to decreased activity - High resting heart rate and occasional palpitations  Hormonal and thyroid  evaluation - Scheduled for blood work at the end of December for hormone levels and thyroid  function - On hormone replacement therapy due to significant symptoms without it, including  joint pain, depression, and anxiety  Chronic nasal congestion - Chronic sinus congestion with constant nasal stuffiness and frequent nose blowing - Uses Flonase  for symptomatic relief - No sinus pain   Most recent fall risk assessment:    06/20/2022    9:49 AM  Fall Risk   Falls in the past year? 0  Number falls in past yr: 0  Injury with Fall? 0   Risk for fall due to : No Fall Risks  Follow up Falls evaluation completed     Data saved with a previous flowsheet row definition     Most recent depression screenings:    01/06/2024    8:45 AM 06/20/2022    9:49 AM  PHQ 2/9 Scores  PHQ - 2 Score 0 0  PHQ- 9 Score 5     {VISON DENTAL STD PSA (Optional):27386} {History (Optional):23778}  Patient Care Team: Alvan Dorothyann BIRCH, MD as PCP - General (Family Medicine) Jefrey Bruckner, MD as Referring Physician (Internal Medicine) Nicki Suzen BIRCH, MD as Referring Physician (Obstetrics and Gynecology)   ROS    Objective:    BP 127/84   Pulse 84   Ht 5' 4 (1.626 m)   Wt 156 lb 9.6 oz (71 kg)   SpO2 100%   BMI 26.88 kg/m  {Vitals History (Optional):23777}  Physical Exam Constitutional:      Appearance: Normal appearance.  HENT:     Head: Normocephalic and atraumatic.     Right Ear: Tympanic membrane, ear canal and external ear normal.     Left Ear: Tympanic membrane, ear canal and external ear normal.     Nose: Nose normal.     Mouth/Throat:     Pharynx: Oropharynx is  clear.  Eyes:     Extraocular Movements: Extraocular movements intact.     Conjunctiva/sclera: Conjunctivae normal.     Pupils: Pupils are equal, round, and reactive to light.  Neck:     Thyroid : No thyromegaly.  Cardiovascular:     Rate and Rhythm: Normal rate and regular rhythm.  Pulmonary:     Effort: Pulmonary effort is normal.     Breath sounds: Normal breath sounds.  Abdominal:     General: Bowel sounds are normal.     Palpations: Abdomen is soft.     Tenderness: There is no  abdominal tenderness.  Musculoskeletal:        General: No swelling.     Cervical back: Neck supple.  Skin:    General: Skin is warm and dry.     Comments: On upper left back she has a pink scaly papular approx 2 mm lesion that looks different than rest of lesion.   Neurological:     Mental Status: She is oriented to person, place, and time.  Psychiatric:        Mood and Affect: Mood normal.        Behavior: Behavior normal.     {PhysExam Abridge (Optional):210964309}  No results found for any visits on 01/06/24. {Show previous labs (optional):23779}     Assessment & Plan:    Routine Health Maintenance and Physical Exam Immunization History  Administered Date(s) Administered   Influenza Inj Mdck Quad Pf 01/22/2009, 11/20/2012, 11/19/2013, 11/14/2014, 11/23/2015   Influenza,inj,Quad PF,6+ Mos 10/23/2019   Influenza-Unspecified 11/06/2017, 11/19/2018, 10/24/2020, 11/19/2022, 11/14/2023   Moderna Sars-Covid-2 Vaccination 01/19/2019, 02/18/2019   Tdap 01/22/2009, 08/11/2020    Health Maintenance  Topic Date Due   HIV Screening  Never done   Hepatitis C Screening  Never done   Pneumococcal Vaccine: 50+ Years (1 of 2 - PCV) Never done   Hepatitis B Vaccines 19-59 Average Risk (1 of 3 - 19+ 3-dose series) Never done   Zoster Vaccines- Shingrix (1 of 2) Never done   COVID-19 Vaccine (3 - Moderna risk series) 03/18/2019   Colonoscopy  12/12/2021   Mammogram  09/18/2023   Cervical Cancer Screening (HPV/Pap Cotest)  07/28/2025   DTaP/Tdap/Td (3 - Td or Tdap) 08/12/2030   Influenza Vaccine  Completed   HPV VACCINES  Aged Out   Meningococcal B Vaccine  Aged Out    Discussed health benefits of physical activity, and encouraged her to engage in regular exercise appropriate for her age and condition.  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 Pink lesion near scar likely from bug bite, itchy and persistent since October. - Referred to dermatologist for evaluation.  Hypertension Blood pressure elevated at 127/84 mmHg, previously 140/96 mmHg. Weight gain  and decreased activity may contribute. Resting heart rate elevated with occasional palpitations. - Restart blood pressure medication. - Ordered CBC and thyroid  function tests. - Encouraged increased physical activity as tolerated.  Hyperlipidemia Cholesterol levels not checked since last visit. No current symptoms of iron  deficiency. - Ordered lipid panel. - Coordinated with hormone specialist to include lipid panel in upcoming labs.  General Health Maintenance Pneumonia vaccine guidelines updated to recommend vaccination at age 27. Insurance changes in January may affect coverage. - Encouraged consideration of pneumonia vaccine at age 42.    Return if symptoms worsen or fail to improve.    Dorothyann Byars, MD [facility] at [facility]

## 2024-01-13 ENCOUNTER — Encounter: Payer: Self-pay | Admitting: Family Medicine

## 2024-01-13 MED ORDER — AMOXICILLIN-POT CLAVULANATE 875-125 MG PO TABS: 1.0000 | ORAL_TABLET | Freq: Two times a day (BID) | ORAL | 0 refills | Status: AC

## 2024-01-22 ENCOUNTER — Ambulatory Visit: Payer: Self-pay | Admitting: Family Medicine

## 2024-01-22 NOTE — Progress Notes (Signed)
 Please call patient. Normal mammogram.  Repeat in 1 year.

## 2024-01-24 ENCOUNTER — Other Ambulatory Visit: Payer: Self-pay | Admitting: Family Medicine
